# Patient Record
Sex: Male | Born: 1955 | Race: White | Hispanic: No | Marital: Married | Smoking: Never smoker
Health system: Southern US, Community
[De-identification: ages and names within clinical notes are randomized; demographics above are authoritative.]

## PROBLEM LIST (undated history)

## (undated) DIAGNOSIS — E785 Hyperlipidemia, unspecified: Secondary | ICD-10-CM

## (undated) DIAGNOSIS — I340 Nonrheumatic mitral (valve) insufficiency: Secondary | ICD-10-CM

## (undated) DIAGNOSIS — D369 Benign neoplasm, unspecified site: Secondary | ICD-10-CM

## (undated) DIAGNOSIS — I272 Pulmonary hypertension, unspecified: Secondary | ICD-10-CM

## (undated) DIAGNOSIS — T7840XA Allergy, unspecified, initial encounter: Secondary | ICD-10-CM

## (undated) DIAGNOSIS — I447 Left bundle-branch block, unspecified: Secondary | ICD-10-CM

## (undated) DIAGNOSIS — I5022 Chronic systolic (congestive) heart failure: Secondary | ICD-10-CM

## (undated) DIAGNOSIS — Z4502 Encounter for adjustment and management of automatic implantable cardiac defibrillator: Secondary | ICD-10-CM

## (undated) DIAGNOSIS — I519 Heart disease, unspecified: Secondary | ICD-10-CM

## (undated) DIAGNOSIS — I420 Dilated cardiomyopathy: Secondary | ICD-10-CM

## (undated) DIAGNOSIS — T82198A Other mechanical complication of other cardiac electronic device, initial encounter: Secondary | ICD-10-CM

## (undated) DIAGNOSIS — I10 Essential (primary) hypertension: Secondary | ICD-10-CM

## (undated) HISTORY — PX: CHOLECYSTECTOMY: SHX55

## (undated) HISTORY — DX: Allergy, unspecified, initial encounter: T78.40XA

## (undated) HISTORY — PX: COLONOSCOPY: SHX174

## (undated) HISTORY — DX: Essential (primary) hypertension: I10

## (undated) HISTORY — DX: Hyperlipidemia, unspecified: E78.5

## (undated) HISTORY — DX: Encounter for adjustment and management of automatic implantable cardiac defibrillator: Z45.02

## (undated) HISTORY — DX: Other mechanical complication of other cardiac electronic device, initial encounter: T82.198A

## (undated) HISTORY — PX: POLYPECTOMY: SHX149

## (undated) HISTORY — DX: Benign neoplasm, unspecified site: D36.9

## (undated) HISTORY — DX: Heart disease, unspecified: I51.9

## (undated) HISTORY — DX: Chronic systolic (congestive) heart failure: I50.22

## (undated) HISTORY — DX: Nonrheumatic mitral (valve) insufficiency: I34.0

## (undated) HISTORY — DX: Dilated cardiomyopathy: I42.0

## (undated) HISTORY — DX: Pulmonary hypertension, unspecified: I27.20

## (undated) HISTORY — PX: OTHER SURGICAL HISTORY: SHX169

## (undated) HISTORY — DX: Left bundle-branch block, unspecified: I44.7

---

## 2000-05-13 ENCOUNTER — Ambulatory Visit (HOSPITAL_COMMUNITY): Admission: RE | Admit: 2000-05-13 | Discharge: 2000-05-13 | Payer: Self-pay | Admitting: Gastroenterology

## 2001-10-25 ENCOUNTER — Ambulatory Visit (HOSPITAL_COMMUNITY): Admission: RE | Admit: 2001-10-25 | Discharge: 2001-10-25 | Payer: Self-pay | Admitting: *Deleted

## 2001-10-25 HISTORY — PX: CARDIAC CATHETERIZATION: SHX172

## 2002-04-27 ENCOUNTER — Encounter (INDEPENDENT_AMBULATORY_CARE_PROVIDER_SITE_OTHER): Payer: Self-pay | Admitting: Specialist

## 2002-04-27 ENCOUNTER — Observation Stay (HOSPITAL_COMMUNITY): Admission: RE | Admit: 2002-04-27 | Discharge: 2002-04-28 | Payer: Self-pay | Admitting: General Surgery

## 2002-04-27 ENCOUNTER — Encounter: Payer: Self-pay | Admitting: General Surgery

## 2002-10-28 ENCOUNTER — Encounter: Payer: Self-pay | Admitting: *Deleted

## 2002-10-28 ENCOUNTER — Observation Stay (HOSPITAL_COMMUNITY): Admission: EM | Admit: 2002-10-28 | Discharge: 2002-10-29 | Payer: Self-pay | Admitting: Emergency Medicine

## 2002-12-29 ENCOUNTER — Encounter: Payer: Self-pay | Admitting: Emergency Medicine

## 2002-12-29 ENCOUNTER — Emergency Department (HOSPITAL_COMMUNITY): Admission: EM | Admit: 2002-12-29 | Discharge: 2002-12-29 | Payer: Self-pay | Admitting: Emergency Medicine

## 2003-01-04 ENCOUNTER — Encounter: Payer: Self-pay | Admitting: Emergency Medicine

## 2003-01-04 ENCOUNTER — Emergency Department (HOSPITAL_COMMUNITY): Admission: EM | Admit: 2003-01-04 | Discharge: 2003-01-05 | Payer: Self-pay | Admitting: Emergency Medicine

## 2003-01-18 ENCOUNTER — Encounter: Payer: Self-pay | Admitting: Cardiology

## 2003-01-18 ENCOUNTER — Inpatient Hospital Stay (HOSPITAL_COMMUNITY): Admission: EM | Admit: 2003-01-18 | Discharge: 2003-01-24 | Payer: Self-pay | Admitting: Emergency Medicine

## 2003-01-20 ENCOUNTER — Encounter: Payer: Self-pay | Admitting: Cardiology

## 2003-09-28 ENCOUNTER — Ambulatory Visit (HOSPITAL_COMMUNITY): Admission: RE | Admit: 2003-09-28 | Discharge: 2003-09-29 | Payer: Self-pay | Admitting: Internal Medicine

## 2004-03-06 ENCOUNTER — Encounter: Admission: RE | Admit: 2004-03-06 | Discharge: 2004-05-23 | Payer: Self-pay | Admitting: Orthopedic Surgery

## 2004-09-16 ENCOUNTER — Ambulatory Visit: Payer: Self-pay

## 2004-12-17 ENCOUNTER — Ambulatory Visit: Payer: Self-pay | Admitting: Internal Medicine

## 2005-03-17 ENCOUNTER — Ambulatory Visit: Payer: Self-pay | Admitting: Internal Medicine

## 2005-06-24 ENCOUNTER — Ambulatory Visit: Payer: Self-pay | Admitting: Internal Medicine

## 2005-09-29 ENCOUNTER — Ambulatory Visit: Payer: Self-pay

## 2006-01-08 ENCOUNTER — Ambulatory Visit: Payer: Self-pay | Admitting: Internal Medicine

## 2006-03-17 ENCOUNTER — Ambulatory Visit: Payer: Self-pay | Admitting: Internal Medicine

## 2006-06-16 ENCOUNTER — Ambulatory Visit: Payer: Self-pay | Admitting: Internal Medicine

## 2006-07-10 ENCOUNTER — Ambulatory Visit: Payer: Self-pay

## 2006-10-21 ENCOUNTER — Ambulatory Visit: Payer: Self-pay | Admitting: Internal Medicine

## 2006-12-02 ENCOUNTER — Ambulatory Visit: Payer: Self-pay | Admitting: Internal Medicine

## 2006-12-17 ENCOUNTER — Ambulatory Visit: Payer: Self-pay | Admitting: Internal Medicine

## 2007-03-09 ENCOUNTER — Ambulatory Visit: Payer: Self-pay | Admitting: Internal Medicine

## 2007-06-17 ENCOUNTER — Ambulatory Visit: Payer: Self-pay | Admitting: Internal Medicine

## 2007-09-22 ENCOUNTER — Ambulatory Visit: Payer: Self-pay | Admitting: Internal Medicine

## 2007-12-15 ENCOUNTER — Ambulatory Visit: Payer: Self-pay | Admitting: Internal Medicine

## 2008-03-09 ENCOUNTER — Ambulatory Visit: Payer: Self-pay | Admitting: Internal Medicine

## 2008-06-13 ENCOUNTER — Ambulatory Visit: Payer: Self-pay | Admitting: Internal Medicine

## 2008-09-14 ENCOUNTER — Ambulatory Visit: Payer: Self-pay

## 2008-10-11 ENCOUNTER — Encounter: Payer: Self-pay | Admitting: Internal Medicine

## 2009-01-04 ENCOUNTER — Encounter (INDEPENDENT_AMBULATORY_CARE_PROVIDER_SITE_OTHER): Payer: Self-pay | Admitting: *Deleted

## 2009-02-15 ENCOUNTER — Encounter: Payer: Self-pay | Admitting: Internal Medicine

## 2009-02-19 ENCOUNTER — Encounter: Payer: Self-pay | Admitting: Internal Medicine

## 2009-02-19 HISTORY — PX: US ECHOCARDIOGRAPHY: HXRAD669

## 2009-02-21 DIAGNOSIS — I5022 Chronic systolic (congestive) heart failure: Secondary | ICD-10-CM

## 2009-02-21 DIAGNOSIS — R7309 Other abnormal glucose: Secondary | ICD-10-CM | POA: Insufficient documentation

## 2009-03-13 ENCOUNTER — Ambulatory Visit: Payer: Self-pay | Admitting: Internal Medicine

## 2009-03-13 LAB — CONVERTED CEMR LAB
CO2: 29 meq/L (ref 19–32)
Chloride: 103 meq/L (ref 96–112)
Potassium: 3.7 meq/L (ref 3.5–5.1)
Sodium: 139 meq/L (ref 135–145)

## 2009-03-22 ENCOUNTER — Ambulatory Visit: Payer: Self-pay | Admitting: Internal Medicine

## 2009-03-22 ENCOUNTER — Encounter: Payer: Self-pay | Admitting: Internal Medicine

## 2009-03-22 ENCOUNTER — Ambulatory Visit (HOSPITAL_COMMUNITY): Admission: RE | Admit: 2009-03-22 | Discharge: 2009-03-22 | Payer: Self-pay | Admitting: Internal Medicine

## 2009-03-23 ENCOUNTER — Telehealth: Payer: Self-pay | Admitting: Internal Medicine

## 2009-03-26 ENCOUNTER — Telehealth (INDEPENDENT_AMBULATORY_CARE_PROVIDER_SITE_OTHER): Payer: Self-pay | Admitting: *Deleted

## 2009-03-28 ENCOUNTER — Telehealth: Payer: Self-pay | Admitting: Internal Medicine

## 2009-04-16 ENCOUNTER — Telehealth: Payer: Self-pay | Admitting: Internal Medicine

## 2009-04-23 ENCOUNTER — Telehealth: Payer: Self-pay | Admitting: Internal Medicine

## 2009-04-26 ENCOUNTER — Ambulatory Visit: Payer: Self-pay | Admitting: Internal Medicine

## 2009-04-27 LAB — CONVERTED CEMR LAB
BUN: 12 mg/dL (ref 6–23)
Chloride: 102 meq/L (ref 96–112)
Eosinophils Relative: 3.2 % (ref 0.0–5.0)
Glucose, Bld: 115 mg/dL — ABNORMAL HIGH (ref 70–99)
HCT: 40.3 % (ref 39.0–52.0)
Hemoglobin: 13.6 g/dL (ref 13.0–17.0)
Lymphs Abs: 1.8 10*3/uL (ref 0.7–4.0)
MCV: 85.6 fL (ref 78.0–100.0)
Monocytes Absolute: 0.7 10*3/uL (ref 0.1–1.0)
Neutro Abs: 5.4 10*3/uL (ref 1.4–7.7)
Platelets: 189 10*3/uL (ref 150.0–400.0)
Potassium: 3.8 meq/L (ref 3.5–5.1)
RDW: 13.9 % (ref 11.5–14.6)
WBC: 8.2 10*3/uL (ref 4.5–10.5)
aPTT: 28.5 s (ref 21.7–28.8)

## 2009-05-03 ENCOUNTER — Inpatient Hospital Stay (HOSPITAL_COMMUNITY): Admission: RE | Admit: 2009-05-03 | Discharge: 2009-05-04 | Payer: Self-pay | Admitting: Internal Medicine

## 2009-05-03 ENCOUNTER — Ambulatory Visit: Payer: Self-pay | Admitting: Internal Medicine

## 2009-05-04 ENCOUNTER — Encounter: Payer: Self-pay | Admitting: Internal Medicine

## 2009-05-15 ENCOUNTER — Encounter: Payer: Self-pay | Admitting: Internal Medicine

## 2009-05-21 ENCOUNTER — Ambulatory Visit: Payer: Self-pay

## 2009-05-21 ENCOUNTER — Encounter: Payer: Self-pay | Admitting: Internal Medicine

## 2009-06-11 ENCOUNTER — Telehealth (INDEPENDENT_AMBULATORY_CARE_PROVIDER_SITE_OTHER): Payer: Self-pay | Admitting: *Deleted

## 2009-06-14 ENCOUNTER — Telehealth: Payer: Self-pay | Admitting: Internal Medicine

## 2009-07-23 ENCOUNTER — Telehealth: Payer: Self-pay | Admitting: Internal Medicine

## 2009-08-13 ENCOUNTER — Encounter: Payer: Self-pay | Admitting: Internal Medicine

## 2009-08-14 ENCOUNTER — Ambulatory Visit: Payer: Self-pay | Admitting: Internal Medicine

## 2009-09-11 ENCOUNTER — Telehealth: Payer: Self-pay | Admitting: Internal Medicine

## 2009-10-04 ENCOUNTER — Ambulatory Visit: Payer: Self-pay

## 2009-10-04 ENCOUNTER — Encounter: Payer: Self-pay | Admitting: Internal Medicine

## 2009-10-20 ENCOUNTER — Observation Stay (HOSPITAL_COMMUNITY): Admission: EM | Admit: 2009-10-20 | Discharge: 2009-10-21 | Payer: Self-pay | Admitting: Emergency Medicine

## 2009-12-24 ENCOUNTER — Encounter: Payer: Self-pay | Admitting: Internal Medicine

## 2009-12-25 ENCOUNTER — Ambulatory Visit: Payer: Self-pay | Admitting: Internal Medicine

## 2010-03-25 ENCOUNTER — Encounter: Payer: Self-pay | Admitting: Internal Medicine

## 2010-03-25 ENCOUNTER — Ambulatory Visit: Payer: Self-pay

## 2010-04-25 ENCOUNTER — Ambulatory Visit: Payer: Self-pay | Admitting: Cardiology

## 2010-07-03 ENCOUNTER — Ambulatory Visit: Payer: Self-pay

## 2010-07-03 ENCOUNTER — Encounter: Payer: Self-pay | Admitting: Internal Medicine

## 2010-09-30 ENCOUNTER — Encounter: Payer: Self-pay | Admitting: Internal Medicine

## 2010-09-30 ENCOUNTER — Ambulatory Visit: Admission: RE | Admit: 2010-09-30 | Discharge: 2010-09-30 | Payer: Self-pay | Source: Home / Self Care

## 2010-10-08 NOTE — Progress Notes (Signed)
Summary: CHEST PRESSURE  Phone Note Call from Patient Call back at Home Phone [phone]   Caller: Patient Summary of Call: EVERYNIGHT AT MID-NIGHT HE WILL FEEL CHEST PRESSURE, REQUEST CALL BACK Initial call taken by: Migdalia Dk,  September 11, 2009 12:24 PM  Follow-up for Phone Call        S/W Pt and he feels what he describes as the same feeling he gets when his device is interrogated. Wanted to know if his device transmits or does something at midnight every night. Advised pt that his device transmits around 4am. He is not having any SOB, radiation of pain, dyphoresis, etc and it only lasts like 15 secs. He is usually up watching t.v. Advised to continue to watch and if it continues to come in to get his device interrogated. He agrees with this plan. Follow-up by: Duncan Dull, RN, BSN,  September 11, 2009 5:00 PM

## 2010-10-08 NOTE — Cardiovascular Report (Signed)
Summary: Office Visit   Office Visit   Imported By: Roderic Ovens 03/27/2010 16:14:15  _____________________________________________________________________  External Attachment:    Type:   Image     Comment:   External Document

## 2010-10-08 NOTE — Procedures (Signed)
Summary: device check   Current Medications (verified): 1)  Lisinopril 10 Mg Tabs (Lisinopril) .... Once Daily 2)  Spironolactone 25 Mg Tabs (Spironolactone) .... Once Daily 3)  Ecotrin 325 Mg Tbec (Aspirin) .... Once Daily 4)  Carvedilol 25 Mg Tabs (Carvedilol) .... Two Times A Day 5)  Simvastatin 40 Mg Tabs (Simvastatin) .... Once Daily 6)  Vitamin D (Ergocalciferol) 50000 Unit Caps (Ergocalciferol) .[name].. 1 X Weekly As Needed 7)  Furosemide 40 Mg Tabs (Furosemide) .... Once Daily 8)  Vitamin D 1000 Unit Tabs (Cholecalciferol) .... Once Daily  Allergies (verified): 1)  ! * Seafood   ICD Specifications Following MD:  Sherryl Manges, MD     Referring MD:  [place] ICD Vendor:  Medtronic     ICD Model Number:  [phone]     ICD Serial Number:  [id number] ICD DOI:  05/03/2009     ICD Implanting MD:  Sherryl Manges, MD  Lead 1:    Location: RA     DOI: 09/28/2003     Model #: 2440     Serial #: [id number]     Status: active Lead 2:    Location: RV     DOI: 09/28/2003     Model #: 4403     Serial #: [id number]     Status: active Lead 3:    Location: LV     DOI: 09/28/2003     Model #: 4194     Serial #: [id number]     Status: active Lead 4:    Location: RV     DOI: 05/03/2009     Model #: 5188     Serial #: [id number]     Status: active  Indications::  ICM, CHF  Explantation Comments: 05/03/2009 Medtronic Sentry [id number] explanted  ICD Follow Up Remote Check?  No Battery Voltage:  3.17 V     Charge Time:  8.9 seconds     Underlying rhythm:  SR ICD Dependent:  No       ICD Device Measurements Atrium:  Amplitude: 5.3 mV, Impedance: 475 ohms, Threshold: 0.75 V at 0.4 msec Right Ventricle:  Amplitude: 13.3 mV, Impedance: 684 ohms, Threshold: 0.5 V at 0.4 msec Left Ventricle:  Impedance: 646 ohms, Threshold: 1.0 V at 0.4 msec Configuration: LV tip-RV coil Shock Impedance: 50/68 ohms   Episodes MS Episodes:  0     Percent Mode Switch:  0     Coumadin:  No Shock:  0     ATP:  0      Nonsustained:  0     Atrial Pacing:  31.9%     Ventricular Pacing:  100%  Brady Parameters Mode DDDR     Lower Rate Limit:  60     Upper Rate Limit 130 PAV 130     Sensed AV Delay:  120 Rate Response Parameters:  ADL response-3, Exertion response-3, Threshold-Med/Low, Acceleration-30 seconds, Deceleration-Exercise  Tachy Zones VF:  200     VT:  OFF     VT1:  171     Next Cardiology Appt Due:  06/08/2010 Tech Comments:  Rate response reprogrmmed as above.  Optivol and thoracic impedance abnormal 6/21-6/24.  ROV 3 months clinic. Altha Harm, LPN  March 25, 2010 11:30 AM

## 2010-10-08 NOTE — Procedures (Signed)
Summary: rov/ gd   Current Medications (verified): 1)  Lisinopril 10 Mg Tabs (Lisinopril) .... Once Daily 2)  Spironolactone 25 Mg Tabs (Spironolactone) .... Once Daily 3)  Ecotrin 325 Mg Tbec (Aspirin) .... Once Daily 4)  Carvedilol 25 Mg Tabs (Carvedilol) .... Two Times A Day 5)  Simvastatin 40 Mg Tabs (Simvastatin) .... Once Daily 6)  Vitamin D (Ergocalciferol) 50000 Unit Caps (Ergocalciferol) .[name].. 1 X Weekly As Needed 7)  Furosemide 40 Mg Tabs (Furosemide) .... Once Daily 8)  Vitamin D 1000 Unit Tabs (Cholecalciferol) .... Once Daily  Allergies (verified): 1)  ! * Seafood    ICD Specifications Following MD:  Sherryl Manges, MD     Referring MD:  [place] ICD Vendor:  Medtronic     ICD Model Number:  [phone]     ICD Serial Number:  [id number] ICD DOI:  05/03/2009     ICD Implanting MD:  Sherryl Manges, MD  Lead 1:    Location: RA     DOI: 09/28/2003     Model #: 1478     Serial #: [id number]     Status: active Lead 2:    Location: RV     DOI: 09/28/2003     Model #: 6578     Serial #: [id number]     Status: active Lead 3:    Location: LV     DOI: 09/28/2003     Model #: 4194     Serial #: [id number]     Status: active Lead 4:    Location: RV     DOI: 05/03/2009     Model #: 2725     Serial #: [id number]     Status: active  Indications::  ICM, CHF  Explantation Comments: 05/03/2009 Medtronic Sentry [id number] explanted  ICD Follow Up Remote Check?  No Battery Voltage:  3.19 V     Charge Time:  9.2 seconds     Underlying rhythm:  SR ICD Dependent:  No       ICD Device Measurements Atrium:  Amplitude: 4.4 mV, Impedance: 437 ohms, Threshold: 0.5 V at 0.4 msec Right Ventricle:  Amplitude: 14 mV, Impedance: 703 ohms, Threshold: 0.5 V at 0.4 msec Left Ventricle:  Impedance: 646 ohms, Threshold: 0.75 V at 0.4 msec Configuration: LV tip-RV coil Shock Impedance: 53/69 ohms   Episodes MS Episodes:  0     Coumadin:  No Shock:  0     ATP:  0     Nonsustained:  0     Atrial Pacing:   22.8%     Ventricular Pacing:  100%  Brady Parameters Mode DDDR     Lower Rate Limit:  60     Upper Rate Limit 130 PAV 130     Sensed AV Delay:  120 Rate Response Parameters:  ADL response-4, Exertion response-3, Threshold-Med/Low, Acceleration-30 seconds, Deceleration-Exercise  Tachy Zones VF:  200     VT:  OFF     VT1:  171     Next Cardiology Appt Due:  12/07/2009 Tech Comments:  Normal device function.  Patient reported feeling like he does during LV threshold testing at midnight every night.  LV autocapture turned off.  Output left fixed at 2V.  ROV 3 months SK. Gypsy Balsam RN BSN  October 04, 2009 12:02 PM

## 2010-10-08 NOTE — Cardiovascular Report (Signed)
Summary: Office Visit   Office Visit   Imported By: Roderic Ovens 10/10/2009 13:48:10  _____________________________________________________________________  External Attachment:    Type:   Image     Comment:   External Document

## 2010-10-08 NOTE — Assessment & Plan Note (Signed)
Summary: pc2   Referring Provider:  Peter [place] Primary Provider:  Deloria Lair  CC:  device check.  History of Present Illness: Mr. Docken is seen in followup for a CRT-D. implanted for congestive heart failure in the setting of nonischemic heart disease. He is doing pretty well. He remains class II. He has modest shortness of breath. He recently underwent echo by Dr. [place] demonstrating interval improvement in ejection fraction to about 45%.  He underwent device generator replacement about4  month ago with a new rate sense lead placed because of the presence of his 6949 lead.  when he was seen a month later heart rate excursion was relatively flat and the rate response out of rhythm was made more aggressive. He has felt better since this was done.The patient denies SOB, chest pain, edema or palpitations      Current Medications (verified): 1)  Lisinopril 10 Mg Tabs (Lisinopril) .... Once Daily 2)  Spironolactone 25 Mg Tabs (Spironolactone) .... Once Daily 3)  Ecotrin 325 Mg Tbec (Aspirin) .... Once Daily 4)  Carvedilol 25 Mg Tabs (Carvedilol) .... Two Times A Day 5)  Simvastatin 40 Mg Tabs (Simvastatin) .... Once Daily 6)  Vitamin D (Ergocalciferol) 50000 Unit Caps (Ergocalciferol) .[name].. 1 X Weekly As Needed 7)  Furosemide 40 Mg Tabs (Furosemide) .... Once Daily 8)  Vitamin D 1000 Unit Tabs (Cholecalciferol) .... Once Daily 9)  Fish Oil 1000 Mg Caps (Omega-3 Fatty Acids) .... Once Daily  Allergies (verified): 1)  ! 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  Alcohol Use - no Drug Use - no  Vital Signs:  Patient profile:   54 year old male Height:      67 inches Weight:      251 pounds BMI:     39.45 Pulse rate:   79 / minute Pulse rhythm:   regular BP sitting:   104 / 68  (left arm) Cuff size:   large  Vitals Entered By: Judithe Modest CMA (December 25, 2009 11:16 AM)    ICD Specifications Following MD:  Sherryl Manges, MD     Referring MD:  [place] ICD Vendor:  Medtronic     ICD Model Number:  [phone]     ICD Serial Number:  [id number] ICD DOI:  05/03/2009     ICD Implanting MD:  Sherryl Manges, MD  Lead 1:    Location: RA     DOI: 09/28/2003     Model #: 1478     Serial #: [id number]     Status: active Lead 2:    Location: RV     DOI: 09/28/2003     Model #: 6578     Serial #: [id number]     Status: active Lead 3:    Location: LV     DOI: 09/28/2003     Model #: 4132     Serial #: [id number]     Status: active Lead 4:    Location: RV  DOI: 05/03/2009     Model #: 6295     Serial #: [id number]     Status: active  Indications::  ICM, CHF  Explantation Comments: 05/03/2009 Medtronic Sentry [phone] explanted  ICD Follow Up Remote Check?  No Battery Voltage:  3.18 V     Charge Time:  8.9 seconds     Underlying rhythm:  SR ICD Dependent:  No       ICD Device Measurements Atrium:  Amplitude: 4.5 mV, Impedance: 437 ohms, Threshold: 0.5 V at 0.4 msec Right Ventricle:  Amplitude: 12.9 mV, Impedance: 646 ohms, Threshold: 0.5 V at 0.4 msec Left Ventricle:  Impedance: 627 ohms, Threshold: 1.0 V at 0.4 msec Configuration: LV tip-RV coil Shock Impedance: 50/6 ohms   Episodes MS Episodes:  0     Percent Mode Switch:  0     Coumadin:  No Shock:  0     ATP:  0     Nonsustained:  0     Atrial Pacing:  32.3%     Ventricular  Pacing:  100%  Brady Parameters Mode DDDR     Lower Rate Limit:  60     Upper Rate Limit 130 PAV 130     Sensed AV Delay:  120 Rate Response Parameters:  ADL response-4, Exertion response-3, Threshold-Med/Low, Acceleration-30 seconds, Deceleration-Exercise  Tachy Zones VF:  200     VT:  OFF     VT1:  171     Next Cardiology Appt Due:  03/08/2010 Tech Comments:  No parameter changes.  [phone] lead stable, with 5076 r/s lead.  No Carlink @ this time. ROV 3 months clinic.  Checked by [employer].  Optivol and thoracic impedance normal. Altha Harm, LPN  December 25, 2009 11:32 AM   Impression & Recommendations:  Problem # 1:  CHRONIC SYSTOLIC HEART FAILURE (ICD-428.22) stable on current meds His updated medication list for this problem includes:    Lisinopril 10 Mg Tabs (Lisinopril) ..... Once daily    Spironolactone 25 Mg Tabs (Spironolactone) ..... Once daily    Ecotrin 325 Mg Tbec (Aspirin) ..... Once daily    Carvedilol 25 Mg Tabs (Carvedilol) .[name]..[name] Two times a day    Furosemide 40 Mg Tabs (Furosemide) ..... Once daily  Problem # 2:  IMPLANTATION  DEFIBRILLATOR, MEDTRONIC CRT (ICD-V45.02) Device parameters and data were reviewed and no changes were made  Problem # 3:  R/O CARDIOMYOPATHY, NON ISCHEMIC NOW LARGELY  RESOLVED (ICD-425.9) stable His updated medication list for this problem includes:    Lisinopril 10 Mg Tabs (Lisinopril) ..... Once daily    Spironolactone 25 Mg Tabs (Spironolactone) ..... Once daily    Ecotrin 325 Mg Tbec (Aspirin) ..... Once daily    Carvedilol 25 Mg Tabs (Carvedilol) .[name]..[name] Two times a day    Furosemide 40 Mg Tabs (Furosemide) ..... Once daily  Patient Instructions: 1)  Your physician recommends that you schedule a follow-up appointment in: 3 months with device clinic.

## 2010-10-08 NOTE — Cardiovascular Report (Signed)
Summary: Office Visit   Office Visit   Imported By: Roderic Ovens 12/27/2009 13:51:21  _____________________________________________________________________  External Attachment:    Type:   Image     Comment:   External Document

## 2010-10-08 NOTE — Procedures (Signed)
Summary: Cardiology Device Clinic   Current Medications (verified): 1)  Lisinopril 10 Mg Tabs (Lisinopril) .... Once Daily 2)  Spironolactone 25 Mg Tabs (Spironolactone) .... Once Daily 3)  Ecotrin 325 Mg Tbec (Aspirin) .... Once Daily 4)  Carvedilol 25 Mg Tabs (Carvedilol) .... Two Times A Day 5)  Simvastatin 40 Mg Tabs (Simvastatin) .... Once Daily 6)  Vitamin D (Ergocalciferol) 50000 Unit Caps (Ergocalciferol) .[name].. 1 X Weekly As Needed 7)  Furosemide 40 Mg Tabs (Furosemide) .... Once Daily 8)  Vitamin D 1000 Unit Tabs (Cholecalciferol) .... Once Daily  Allergies (verified): 1)  ! * Seafood   ICD Specifications Following MD:  Sherryl Manges, MD     Referring MD:  [place] ICD Vendor:  Medtronic     ICD Model Number:  [id number]     ICD Serial Number:  [id number] H ICD DOI:  05/03/2009     ICD Implanting MD:  Sherryl Manges, MD  Lead 1:    Location: RA     DOI: 09/28/2003     Model #: 0102     Serial #: [id number]     Status: active Lead 2:    Location: RV     DOI: 09/28/2003     Model #: 3474     Serial #: [id number]     Status: active Lead 3:    Location: LV     DOI: 09/28/2003     Model #: 4194     Serial #: [id number]     Status: active Lead 4:    Location: RV     DOI: 05/03/2009     Model #: 8416     Serial #: [id number]     Status: active  Indications::  ICM, CHF  Explantation Comments: 05/03/2009 Medtronic Sentry [id number] explanted  ICD Follow Up Remote Check?  No Battery Voltage:  3.15 V     Charge Time:  9.1 seconds     Underlying rhythm:  sr ICD Dependent:  No       ICD Device Measurements Atrium:  Amplitude: 5.3 mV, Impedance: 475 ohms, Threshold: 0.5 V at 0.4 msec Right Ventricle:  Amplitude: 13.3 mV, Impedance: 703 ohms, Threshold: 0.5 V at 0.4 msec Left Ventricle:  Impedance: 646 ohms, Threshold: 1.0 V at 0.4 msec Configuration: LV tip-RV coil Shock Impedance: 52/73 ohms   Episodes MS Episodes:  0     Percent Mode Switch:  0     Coumadin:  No Shock:  0     ATP:   0     Nonsustained:  0     Atrial Pacing:  28.7%     Ventricular Pacing:  100%  Brady Parameters Mode DDDR     Lower Rate Limit:  60     Upper Rate Limit 130 PAV 130     Sensed AV Delay:  120 Rate Response Parameters:  ADL response-3, Exertion response-3, Threshold-Med/Low, Acceleration-30 seconds, Deceleration-Exercise  Tachy Zones VF:  200     VT:  OFF     VT1:  171     Next Cardiology Appt Due:  09/08/2010 Tech Comments:  No parameter changes.  Device function normal. Opitvol and thoracic impedance normal.  6949 lead stable, SIC 0.  ROV 3months clinic. Altha Harm, LPN  July 03, 2010 4:14 PM

## 2010-10-08 NOTE — Miscellaneous (Signed)
Summary:  ABDOMEN - 2 VIEW  Clinical Lists Changes  Observations: Added new observation of RESULTS MISC:    ABDOMEN - 2 VIEW   Comparison: None    Findings: Prior cholecystectomy. There is normal bowel gas pattern.   No free air.  No organomegaly or suspicious calcification.  No   acute bony abnormality. Defibrillator wire is seen within the   heart.  Heart appears mildly enlarged.    IMPRESSION:   No acute findings.    Read By:  Charlett Nose,  M.D.   Released By:  Charlett Nose,  M.D. (10/21/2009 10:25)      MISC. Report  Procedure date:  10/21/2009  Findings:         ABDOMEN - 2 VIEW   Comparison: None    Findings: Prior cholecystectomy. There is normal bowel gas pattern.   No free air.  No organomegaly or suspicious calcification.  No   acute bony abnormality. Defibrillator wire is seen within the   heart.  Heart appears mildly enlarged.    IMPRESSION:   No acute findings.    Read By:  Charlett Nose,  M.D.   Released By:  Charlett Nose,  M.D.

## 2010-10-08 NOTE — Procedures (Signed)
Summary: device/saf   Allergies: 1)  ! * Seafood   ICD Specifications Following MD:  Sherryl Manges, MD     Referring MD:  [place] ICD Vendor:  Medtronic     ICD Model Number:  [phone]     ICD Serial Number:  [id number] ICD DOI:  05/03/2009     ICD Implanting MD:  Sherryl Manges, MD  Lead 1:    Location: RA     DOI: 09/28/2003     Model #: 5076     Serial #: [id number]     Status: active Lead 2:    Location: RV     DOI: 09/28/2003     Model #: 1308     Serial #: [id number]     Status: active Lead 3:    Location: LV     DOI: 09/28/2003     Model #: 4194     Serial #: [id number]     Status: active Lead 4:    Location: RV     DOI: 05/03/2009     Model #: 4010     Serial #: [id number]     Status: active  Indications::  ICM, CHF  Explantation Comments: 05/03/2009 Medtronic Sentry [phone] explanted  ICD Follow Up ICD Dependent:  No       ICD Device Measurements Configuration: LV tip-RV coil  Episodes Coumadin:  No  Brady Parameters Mode DDDR     Lower Rate Limit:  60     Upper Rate Limit 130 PAV 130     Sensed AV Delay:  120 Rate Response Parameters:  ADL response-3, Exertion response-3, Threshold-Med/Low, Acceleration-30 seconds, Deceleration-Exercise  Tachy Zones VF:  200     VT:  OFF     VT1:  171

## 2010-10-08 NOTE — Cardiovascular Report (Signed)
Summary: Office Visit   Office Visit   Imported By: Roderic Ovens 07/18/2010 14:30:37  _____________________________________________________________________  External Attachment:    Type:   Image     Comment:   External Document

## 2010-10-10 NOTE — Procedures (Signed)
Summary: device check   Current Medications (verified): 1)  Lisinopril 10 Mg Tabs (Lisinopril) .... Once Daily 2)  Spironolactone 25 Mg Tabs (Spironolactone) .... Once Daily 3)  Ecotrin 325 Mg Tbec (Aspirin) .... Once Daily 4)  Carvedilol 25 Mg Tabs (Carvedilol) .... Two Times A Day 5)  Simvastatin 40 Mg Tabs (Simvastatin) .... Once Daily 6)  Furosemide 40 Mg Tabs (Furosemide) .... Once Daily 7)  Vitamin D 1000 Unit Tabs (Cholecalciferol) .... One By Mouth Two Times A Day  Allergies (verified): 1)  ! * Seafood   ICD Specifications Following MD:  Sherryl Manges, MD     Referring MD:  [place] ICD Vendor:  Medtronic     ICD Model Number:  [phone]     ICD Serial Number:  [id number] H ICD DOI:  05/03/2009     ICD Implanting MD:  Sherryl Manges, MD  Lead 1:    Location: RA     DOI: 09/28/2003     Model #: 5638     Serial #: [id number]     Status: active Lead 2:    Location: RV     DOI: 09/28/2003     Model #: 1884     Serial #: [id number]     Status: active Lead 3:    Location: LV     DOI: 09/28/2003     Model #: 4194     Serial #: [id number]     Status: active Lead 4:    Location: RV     DOI: 05/03/2009     Model #: 7322     Serial #: [id number]     Status: active  Indications::  ICM, CHF  Explantation Comments: 05/03/2009 Medtronic Sentry [id number]/[id number] explanted  ICD Follow Up Remote Check?  No Battery Voltage:  3.14 V     Charge Time:  9.1 seconds     Underlying rhythm:  SR ICD Dependent:  No       ICD Device Measurements Atrium:  Amplitude: 4.8 mV, Impedance: 418 ohms, Threshold: 0.5 V at 0.4 msec Right Ventricle:  Amplitude: 11.0 mV, Impedance: 684 ohms, Threshold: 0.5 V at 0.4 msec Left Ventricle:  Impedance: 646 ohms, Threshold: 0.75 V at 0.4 msec Configuration: LV tip-RV coil Shock Impedance: 51/72 ohms   Episodes MS Episodes:  0     Percent Mode Switch:  0     Coumadin:  No Shock:  0     ATP:  0     Nonsustained:  0     Atrial Pacing:  19.8%     Ventricular Pacing:   100%  Brady Parameters Mode DDDR     Lower Rate Limit:  60     Upper Rate Limit 130 PAV 130     Sensed AV Delay:  120 Rate Response Parameters:  ADL response-3, Exertion response-3, Threshold-Med/Low, Acceleration-30 seconds, Deceleration-Exercise  Tachy Zones VF:  200     VT:  OFF     VT1:  171     Next Cardiology Appt Due:  12/08/2010 Tech Comments:  No parameter changes. Device function normal.  Optivol normal, thoracic impedance below threshold, the patient denies any SOB, and no ankle edema noted.  He is however scheduled for a sleep study this evening.  No Carelink @ this time. ROV 3 months with Dr. Graciela Husbands.

## 2010-10-16 NOTE — Cardiovascular Report (Signed)
Summary: Office Visit   Office Visit   Imported By: Roderic Ovens 10/10/2010 10:31:21  _____________________________________________________________________  External Attachment:    Type:   Image     Comment:   External Document

## 2010-11-01 ENCOUNTER — Ambulatory Visit (INDEPENDENT_AMBULATORY_CARE_PROVIDER_SITE_OTHER): Payer: 59 | Admitting: Cardiology

## 2010-11-01 DIAGNOSIS — I509 Heart failure, unspecified: Secondary | ICD-10-CM

## 2010-11-01 DIAGNOSIS — I428 Other cardiomyopathies: Secondary | ICD-10-CM

## 2010-11-01 DIAGNOSIS — I447 Left bundle-branch block, unspecified: Secondary | ICD-10-CM

## 2010-11-01 DIAGNOSIS — E7800 Pure hypercholesterolemia, unspecified: Secondary | ICD-10-CM

## 2010-11-27 LAB — POCT CARDIAC MARKERS
CKMB, poc: <1 ng/mL — ABNORMAL LOW (ref 1.0–8.0)
Myoglobin, poc: 41.5 ng/mL (ref 12–200)
Troponin i, poc: <0.05 ng/mL (ref 0.00–0.09)

## 2010-11-27 LAB — CBC
HCT: 37.8 % — ABNORMAL LOW (ref 39.0–52.0)
Hemoglobin: 13 g/dL (ref 13.0–17.0)
MCHC: 34.4 g/dL (ref 30.0–36.0)
MCV: 86.2 fL (ref 78.0–100.0)
Platelets: 165 10*3/uL (ref 150–400)
RBC: 4.39 MIL/uL (ref 4.22–5.81)
RDW: 14.5 % (ref 11.5–15.5)
RDW: 14.9 % (ref 11.5–15.5)
WBC: 10.3 10*3/uL (ref 4.0–10.5)

## 2010-11-27 LAB — POCT I-STAT, CHEM 8
BUN: 13 mg/dL (ref 6–23)
Calcium, Ion: 1.1 mmol/L — ABNORMAL LOW (ref 1.12–1.32)
Chloride: 103 meq/L (ref 96–112)
Creatinine, Ser: 0.7 mg/dL (ref 0.4–1.5)
Glucose, Bld: 117 mg/dL — ABNORMAL HIGH (ref 70–99)
HCT: 38 % — ABNORMAL LOW (ref 39.0–52.0)
Hemoglobin: 12.9 g/dL — ABNORMAL LOW (ref 13.0–17.0)
Potassium: 4 mEq/L (ref 3.5–5.1)
Sodium: 136 meq/L (ref 135–145)
TCO2: 28 mmol/L (ref 0–100)

## 2010-11-27 LAB — COMPREHENSIVE METABOLIC PANEL
AST: 17 U/L (ref 0–37)
Albumin: 3.6 g/dL (ref 3.5–5.2)
BUN: 9 mg/dL (ref 6–23)
Calcium: 9 mg/dL (ref 8.4–10.5)
Chloride: 99 mEq/L (ref 96–112)
Creatinine, Ser: 0.83 mg/dL (ref 0.4–1.5)
GFR calc Af Amer: >60 mL/min (ref >60)
Total Bilirubin: 0.7 mg/dL (ref 0.3–1.2)

## 2010-11-27 LAB — CK TOTAL AND CKMB (NOT AT ARMC)
CK, MB: 0.9 ng/mL (ref 0.3–4.0)
CK, MB: 0.9 ng/mL (ref 0.3–4.0)
CK, MB: 1.2 ng/mL (ref 0.3–4.0)
Relative Index: 0.8 (ref 0.0–2.5)
Relative Index: 1 (ref 0.0–2.5)
Total CK: 112 U/L (ref 7–232)
Total CK: 115 U/L (ref 7–232)
Total CK: 122 U/L (ref 7–232)

## 2010-11-27 LAB — LIPID PANEL
Cholesterol: 112 mg/dL (ref 0–200)
LDL Cholesterol: 52 mg/dL (ref 0–99)
Total CHOL/HDL Ratio: 3.3 RATIO
Triglycerides: 129 mg/dL (ref <150)
VLDL: 26 mg/dL (ref 0–40)

## 2010-11-27 LAB — TROPONIN I
Troponin I: <0.01 ng/mL (ref 0.00–0.06)
Troponin I: <0.01 ng/mL (ref 0.00–0.06)

## 2010-11-27 LAB — AMYLASE: Amylase: 41 U/L (ref 0–105)

## 2011-01-19 ENCOUNTER — Other Ambulatory Visit: Payer: Self-pay | Admitting: Cardiology

## 2011-01-20 NOTE — Telephone Encounter (Signed)
escribe medication per fax request  

## 2011-01-21 DIAGNOSIS — R7301 Impaired fasting glucose: Secondary | ICD-10-CM | POA: Insufficient documentation

## 2011-01-21 NOTE — Discharge Summary (Signed)
NAME:  Jonathon Avila, [place] NO.:  [id number]   MEDICAL RECORD NO.:  [id number]          PATIENT TYPE:  INP   LOCATION:  2007                         FACILITY:  [facility]   PHYSICIAN:  Duke Salvia, MD, FACCDATE OF BIRTH:  Oct 11, 1955   DATE OF ADMISSION:  05/03/2009  DATE OF DISCHARGE:  05/04/2009                               DISCHARGE SUMMARY   CARDIOLOGIST:  [name], MD   No known drug allergies.   TIME FOR THIS DICTATION:  Greater than 35 minutes.   FINAL DIAGNOSES:  1. Discharging day 1, status post explant of a Medtronic InSync Sentry      cardioverter-defibrillator with implant of a Medtronic CONCERTO      biventricular implantable cardioverter defibrillator generator with      placement of new right ventricular lead (this is replacing his 6949      lead).  2. Defibrillator threshold study less than or equal to 20 joules.   SECONDARY DIAGNOSES:  1. 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  2. Echocardiogram, February 19, 2009, ejection fraction 45%.  3. Nonischemic cardiomyopathy.  4. New York Heart Association class II chronic systolic congestive      heart failure.  5. Dyslipidemia.  6. Obesity.   PROCEDURE:  May 03, 2009:  Generator change with replacement of a  Medtronic InSync Sentry with a Medtronic CONCERTO CRT-D  resynchronization ICD system, Dr. Sherryl Manges.  Defibrillator threshold  study less than or equal to 20 joules.   BRIEF HISTORY:  Jonathon Avila is a 55 year old male.  He has a history  of congestive heart failure.  It is at class II currently.  It is a  nonischemic cardiomyopathy.  He has in place a BiV ICD.  It is now at  elective replacement indicator.  His most recent echocardiogram shows  ejection fraction of 45%.  The risks and benefits of replacement of the  generator explained to the patient, and he wishes to proceed.  Of note,  he has a 6949 right ventricular lead in place, and this  will also be  replaced at the time of the generator change.   HOSPITAL COURSE:  The patient presents electively on May 03, 2009.  He underwent change out of the generator without complication.  A new  right ventricular lead was placed.  Mobility of the left arm has been  discussed with the patient as has incision care.  Chest x-ray shows that  the leads are in the appropriate position.  There is no pneumothorax.  Device interrogation shows that all values are within normal limits.  The patient has no hematoma and pain is controlled well with oral  analgesics.  He is discharged in postprocedure day #1 on the following  meds:  1. Enteric-coated aspirin 325 mg daily.  2. Coreg 25 mg twice daily.  3. Furosemide 40 mg daily as needed.  4. Lisinopril 10 mg daily.  5. Simvastatin 40 mg daily at bedtime.  6. Spironolactone 25 mg daily.  7. Vitamin D2 of 50,000 units on Saturdays.  8. Vitamin D3  of 1000 units daily.   LABORATORY STUDIES:  Pertinent to this admission were drawn on April 26, 2009.  Sodium 136, potassium 3.8, chloride 102, carbonate 28,  glucose 115, BUN is 12, creatinine 0.7.  White cells 8.2, hemoglobin  13.6, hematocrit 40.3, and platelets of 189.  Protime 11.6, INR 1.1.      Jonathon Avila, [place]      Duke Salvia, MD, [facility]  Electronically Signed    GM/MEDQ  D:  05/04/2009  T:  05/04/2009  Job:  [id number]   cc:   [name], M.D.  Ursula Beath, MD

## 2011-01-21 NOTE — Op Note (Signed)
NAME:  Jonathon Avila, Jonathon Avila           ACCOUNT NO.:  [id number]   MEDICAL RECORD NO.:  [id number]          PATIENT TYPE:  AMB   LOCATION:  SDS                          FACILITY:  [facility]   PHYSICIAN:  Doylene Canning. Ladona Ridgel, MD    DATE OF BIRTH:  [birth year]   DATE OF PROCEDURE:  DATE OF DISCHARGE:  03/22/2009                               OPERATIVE REPORT   SURGEON:  Doylene Canning. Ladona Ridgel, MD   PROCEDURE PERFORMED:  Contrast venography of the left upper extremity.   INTRODUCTION:  The patient is a 55 year old male with a history of BiV  ICD insertion who has a 6949 lead.  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  He is referred now for left upper extremity venography.   PROCEDURE:  After informed consent was obtained, the patient was taken  to the Diagnostic EP Lab in a fasting state.  After usual preparation,  20 mL of contrast was injected into the left upper extremity venous  system, which demonstrated a patent left subclavian vein.  The patient  was observed for 30 minutes and had no complications from his procedure,  and he was returned to his room in satisfactory condition.   COMPLICATIONS:  There were no immediate procedure complications.   RESULTS:  This demonstrates a patent left subclavian vein in a patient  with prior BiV ICD insertion with pending 6949 lead revision.      Doylene Canning. Ladona Ridgel, MD  Electronically Signed     Doylene Canning. Ladona Ridgel, MD  Electronically Signed    GWT/MEDQ  D:  03/22/2009  T:  03/23/2009  Job:  [id number]

## 2011-01-21 NOTE — Assessment & Plan Note (Signed)
[facility]                         ELECTROPHYSIOLOGY OFFICE NOTE   NAME:Jonathon Avila, Jonathon Avila                    MRN:          [id number]  DATE:03/09/2008                            DOB:          Feb 05, 1956    Jonathon Avila is seen in followup for congestive heart failure in the  setting of nonischemic heart disease.  He is status post CRT  implantation and continues to do really pretty well.  He has been  struggling with elevated blood sugars.  He has been able to lose 10  pounds in the last year and is working on that slowly.   His medications are reviewed and include,  1. Coreg 25 b.i.d.  2. Spironolactone 25.  3. Lisinopril 10.  4. Aspirin.  5. Simvastatin 40.   On examination today, his blood pressure is 118/60 with a pulse of 70.  His weight was 246 as noted.  His lungs were clear.  His neck veins were  flat.  His heart sounds were regular.  His extremities were without  edema.   Interrogation of his Medtronic ICD demonstrates P-wave of 6.3 with  impedance of 448 and threshold of 1 V at 0.2 and RA and RV amplitude was  20.4 and impedance of 614, the LV impedance was 736 with threshold of 1  V at 0.3.  There were no intercurrent episodes.  He is 100%  ventricularly paced.   He has a 6949 lead in.   IMPRESSION:  1. Nonischemic cardiomyopathy.  2. Congestive heart failure - chronic - systolic.  3. Status post cardiac resynchronization therapy defibrillator for the      above for 6949 lead - stable.  4. Obesity.  5. Elevated blood sugars.   Jonathon Avila is doing quite well from an arrhythmia point of view.  We  had a long discussion regarding the importance of exercise and weight  loss in terms of reducing the potential for developing diabetes.   He understands and he will be working on this hopefully.   We will see him again in 1 year's time and he will follow up with Dr.  Swaziland and Mazzocchi.     Duke Salvia, MD, [facility]  Electronically Signed    SCK/MedQ  DD: 03/09/2008  DT: 03/10/2008  Job #: [id number]   cc:   [name], M.D.  Talmadge Coventry, M.D.

## 2011-01-21 NOTE — Op Note (Signed)
NAME:  TORREZ, RENFROE NO.:  [id number]   MEDICAL RECORD NO.:  [id number]          PATIENT TYPE:  INP   LOCATION:  2007                         FACILITY:  [facility]   PHYSICIAN:  Duke Salvia, MD, FACCDATE OF BIRTH:  [birth year]   DATE OF PROCEDURE:  05/03/2009  DATE OF DISCHARGE:                               OPERATIVE REPORT   PREOPERATIVE DIAGNOSIS:  Previously implanted CRT-D with a 6949 lead,  now at end-of-life.   POSTOPERATIVE DIAGNOSIS:  Previously implanted CRT-D with a 6949 lead,  now at end-of-life.   PROCEDURE:  Explantation of a previously implanted device implantation,  with a new device insertion of a new RV sense lead and intraoperative  defibrillation threshold testing.   Following obtaining informed consent, the patient was brought to the  Electrophysiology Laboratory and placed on the fluoroscopic table in the  supine position.  After routine prep and drape and with a prior venogram  having demonstrated patency extrathoracic left subclavian vein, an  incision was made and carried down to layer of device pocket using sharp  dissection and electrocautery.  I could not get cephalad enough with a  location of the incision in the pocket so I had to explant the device  from the offset.   We then gained access to the extrathoracic left subclavian vein which  was done without difficulty.  However, deployment of a sheath and/or  leads was quite difficult.  We ended up having to upsize using 8-French  and 9-French sheaths and then using an Amplatz superstiff wire to allow  for tracking of the 7-French sheath through which was then passed, a  Medtronic 5076, 58-cm active fixation ventricular lead, serial number  [id number].  Under fluoroscopic guidance, it was manipulated to just  above the RV defibrillator lead where the bipolar R-wave was 10.6 with a  pace impedance of 1026 and threshold of 0.2 volts at 0.5 msec.  Current  threshold 1.3 mA.  There was  no diaphragmatic pacing at 10 volts and the  current of injury was brisk.  This lead was secured to the prepectoral  fascia and marked with a tie.   The previously implanted leads were then attached to a Medtronic  Concerto II CRT-D device, model D274TRK, serial number [id number].  Through the device, bipolar P-wave was 4.6 with pace impedance of 437,  threshold 0.5 and 0.4.  The R-wave was 9.2 with a pace impedance of 836,  threshold 0.75 at 0.4, and the LV impedance of 703 with threshold of  0.4.  Proximal coil impedance was 69 ohms.  Distal coil impedance was 56  ohms.   At this point, the defibrillation threshold testing was undertaken.  Ventricular fibrillation was induced via the T-wave shock after a total  duration of 7 seconds.  A 20-joule shock was delivered through measured  resistance of 55 ohms, terminating ventricular fibrillation and  restoring sinus rhythm.  The device was implanted.  Prior to this,  however, the pocket had to be expanded cephalad to allow for easy  coverage of the device and the pocket was copiously irrigated with  antibiotic-containing saline solution.  I should note also that the  paced sense portion of the previously implanted 6949 lead was capped.   The wound was then closed in 3 layers in a normal fashion.  Wound was  washed, dried and a benzoin and Steri-Strip dressing was applied.  Needle counts, sponge counts, and instrument counts were correct at the  end of procedure according to the staff.  The patient tolerated the  procedure without apparent complication.      Duke Salvia, MD, [facility]  Electronically Signed     SCK/MEDQ  D:  05/03/2009  T:  05/04/2009  Job:  [id number]   cc:   [name], M.D.

## 2011-01-21 NOTE — Assessment & Plan Note (Signed)
[facility]                         ELECTROPHYSIOLOGY OFFICE NOTE   NAME:Jonathon Avila, Jonathon Avila                    MRN:          [id number]  DATE:03/09/2007                            DOB:          Nov 16, 1955    Mr. Dymek comes in.  He has congestive heart failure, now class II  in the setting of nonischemic heart disease.   His big issue has been his weight is continuing to increase.  He has not  been exercising.   MEDICATIONS:  Coreg, Altace, aspirin, and Lovastatin.   PHYSICAL EXAMINATION:  VITAL SIGNS:  His blood pressure is 131/79.  His  pulse is 89, and his weight is up to 258 pounds, only 3 pounds in the  last year, but is 20 pounds in the last 3 years.  LUNGS:  Clear.  HEART SOUNDS:  Regular.  EXTREMITIES:  Without edema.   Interrogation of his Medtronic Sentry [phone] ICD demonstrates a P wave of  6.2 with impedance of 416, a threshold of 0.5 and 0.5.  R wave was 20.4  with an impedance of 600, threshold of 0.5 and 0.3.  The LV impedance  was 696 with a threshold of 1 volt at 0.3.  Battery voltage is 3.02.  There are no intercurrent therapies.   IMPRESSION:  1. Nonischemic cardiomyopathy.  2. Congestive heart failure, previously class III now class II.  3. Status post CRT for the above.  4. Obesity.   Mr. Kerstein is stable from an arrhythmia point of view.  I have  encouraged him about working on his weight, dieting, small portions, and  his obesity is going to be a huge problem for him if he does not get it  under control.   He has pledged to try and lose 10 pounds before he sees Dr. Swaziland in  November.     Duke Salvia, MD, [facility]  Electronically Signed    SCK/MedQ  DD: 03/09/2007  DT: 03/09/2007  Job #: [id number]   cc:   [name], M.D.  Talmadge Coventry, M.D.

## 2011-01-24 NOTE — Discharge Summary (Signed)
NAME:  Jonathon Avila, SWIFT NO.:  [id number]   MEDICAL RECORD NO.:  [id number]                   PATIENT TYPE:  OIB   LOCATION:  3715                                 FACILITY:  [facility]   PHYSICIAN:  Duke Salvia, M.D.               DATE OF BIRTH:  [birth year]   DATE OF ADMISSION:  09/28/2003  DATE OF DISCHARGE:  09/29/2003                                 DISCHARGE SUMMARY   PRIMARY DIAGNOSES:  1. Congestive heart failure.  2. Ischemic cardiomyopathy.   SECONDARY DIAGNOSES:  1. Nonischemic cardiomyopathy with left ventricular dysfunction.  2. Left bundle-branch block.  3. Persistent congestive heart failure.   HISTORY OF PRESENT ILLNESS:  This is a 55 year old gentleman with  unexplained but presumably viral nonischemic cardiomyopathy for which he  presented in severe CHF early spring despite significant improvements on  complete regimen of standard medical therapy, remains function quite limited  with shortness of breath at less than 10 steps or 100 yards.  He estimated 6  minute walk would be about 325 meters based on his other walking time.  He  has 2 pillow orthopnea, but he does not have nocturnal dyspnea, peripheral  edema, no syncope or palpitations.  The patient was admitted for placement  of a BiV ICD.  He underwent successful implant of his BiV ICD with EFPs of  less than 25 joules with reverse polarity.  CRT was placed.  Patient had no  immediate postop complications.  He was discharged the following day in  stable condition.  Chest x-ray showed no pneumothorax.  His BiV ICD  parameters were within normal limits, and he was discharged to home in  stable condition.  He was discharged on all of his previous medications.   DISCHARGE MEDICATIONS:  1. Coreg 25 b.i.d.  2. Altace 5 b.i.d.  3. Lasix 20 b.i.d.  4. Aldactone 25 daily.  5. Aspirin 325 daily.  6. Tylenol 1-2 tabs q.4-6h. p.r.n.   ACTIVITY AND WOUND CARE:  Per pacemaker discharge  sheet.  Patient was  restricted from driving for 10 days.  He was to follow with the Pacemaker  Clinic at [facility], February 2, at 9 a.m. and Dr. Graciela Husbands, February 23 at 9:15  a.m.  The patient was to continue following with Dr. Peter Swaziland.      Chinita Pester, C.R.N.P. [facility]                 Duke Salvia, M.D.    DS/MEDQ  D:  09/29/2003  T:  09/30/2003  Job:  [id number]   cc:   Duke Salvia, M.D.   [name], M.D.  [address], [address]  [place], [place] [address]  Fax: [phone]

## 2011-01-24 NOTE — H&P (Signed)
NAME:  Jonathon Avila, Jonathon Avila                        ACCOUNT NO.:  [id number]   MEDICAL RECORD NO.:  [id number]                   PATIENT TYPE:  INP   LOCATION:  1829                                 FACILITY:  [facility]   PHYSICIAN:  Colleen Can. Deborah Chalk, M.D.            DATE OF BIRTH:  1955-10-12   DATE OF ADMISSION:  01/18/2003  DATE OF DISCHARGE:                                HISTORY & PHYSICAL   CHIEF COMPLAINT:  Presyncope.   HISTORY OF PRESENT ILLNESS:  The patient is a very pleasant 55 year old  white male who presents to the emergency room today for elective admission.  He has known history of cardiomyopathy with LV dysfunction with congestive  heart failure.  He has basically had nonobstructive coronary disease per  recent cardiac catheterization.  He was seen and evaluated by Dr. Peter  Swaziland earlier this month wiht complaints of increasing shortness of breath.  He basically has been very active up until the end of February of this year  when he began to develop pulmonary symptoms.  He was seen in the emergency  room at [facility] where a chest x-ray showed evidence of CHF.  He had CT  scan of the chest which confirmed these findings.  He was evaluated by Dr.  Sandrea Hughs who felt that his symptoms were not pulmonary related but more  cardiac.  Over the past weekend, he has been started on Altace as well as  Lasix.  He was seen and evaluated by Dr. Peter Swaziland and was started on  Carvedilol.  He called the physician on call this past weekend with  complaints of palpitations.  He had a 2-D echocardiogram in the office this  past Monday, and at that time, he had a [employer] monitor placed.  He  began transmitting long runs of ventricular tachycardia since that time.  The patient has felt lightheaded.  He has had some diuresis with his current  medical regimen.  He is now admitted for further evaluation as well as for  EP studies.   PAST MEDICAL HISTORY:  1. CHF with most  recent 2-D echocardiogram performed on Monday, May 10,     showing mild LV enlargement with severe global hypokinesia and severely     depressed systolic function, marked paradoxical septal motion,     restrictive left ventricular filling with elevated filling pressures,     moderate to severe mitral insufficiency, mild pulmonary hypertension,     mild tricuspid insufficiency, and moderate left atrial enlargement.  2. Left bundle branch block.  3. Previous abnormal Cardiolite revealing evidence of anterior wall ischemia     with subsequent cardiac catheterization demonstrating only mild     nonobstructive disease.  4. Status post cholecystectomy.   ALLERGIES:  None.   CURRENT MEDICATIONS:  1. Lasix 40 mg a day.  2. Altace 5 mg a day.  3. Coreg 6.25 b.i.d.  4. Aspirin  daily.   FAMILY HISTORY:  Father is age 78 and has a history of angina.  Mother is 66  and sees a heart doctor as well.  He has one sister who has had previous A-V  block.   SOCIAL HISTORY:  He previously worked as a Electrical engineer for a shopping  center.  He is married and has two children.  He denies tobacco, alcohol, or  other drug use.   REVIEW OF SYSTEMS:  Basically as noted above.  He has had PND and orthopnea,  but this is improved with his current medical regimen.  He has had no chest  pain.  He has no history of stroke or TIAs and no claudication symptoms.  No  evidence of diabetes or hypertension.  Otherwise Review of Systems is as  noted above and otherwise unremarkable.   PHYSICAL EXAMINATION:  GENERAL:  He is an obese white male who appears older  than his stated age.  VITAL SIGNS:  Last documented weight was 224 pounds.  Blood pressure is  currently 121/84, heart rate in the 90s, respiratory rate 20.  SKIN:  Warm and dry.  His color is somewhat pale.  LUNGS:  Basically clear to auscultation.  CARDIAC:  Exam shows no evidence of an S3.  ABDOMEN:  Obese.  EXTREMITIES:  Edema bilaterally.   NEUROLOGIC:  Intact.  He has no gross focal deficits.   LABORATORY DATA:  Pertinent labs are pending.   EKG shows normal sinus rhythm with left bundle branch block.   Chest x-ray is pending.   OVERALL IMPRESSION:  1. Cardiomyopathy, most likely idiopathic.  2. Severe left ventricular dysfunction.  3. Congestive heart failure.  4. Left bundle branch block.  5. New onset ventricular tachycardia without frank syncope.  6. Obesity.    PLAN:  Will proceed on wiht admission to the hospital.  His current  medicines will be continued.  Will have Dr. Berton Mount see and evaluate the  patient as well.  It may be the patient would be a candidate for  biventricular pacemaker/AICD implantation.  For now, will hold off on  initiation of antiarrhythmics.  He will be monitored on telemetry.  Complete  labs will be assessed as well.     Juanell Fairly C. Earl Gala, N.P.                 Colleen Can. Deborah Chalk, M.D.    LCO/MEDQ  D:  01/18/2003  T:  01/18/2003  Job:  [id number]   cc:   [name], M.D.  [address], [address]  [place], [place] [address]  Fax: [phone]   Duke Salvia, M.D.   Talmadge Coventry, M.D.  [address], [address]  [place]  [place] [address]  Fax: [phone]

## 2011-01-24 NOTE — Discharge Summary (Signed)
NAME:  Jonathon Avila NO.:  [id number]   MEDICAL RECORD NO.:  [id number]                   PATIENT TYPE:  INP   LOCATION:  4730                                 FACILITY:  [facility]   PHYSICIAN:  [name], M.D.               DATE OF BIRTH:  [birth year]   DATE OF ADMISSION:  01/18/2003  DATE OF DISCHARGE:  01/24/2003                                 DISCHARGE SUMMARY   HISTORY OF PRESENT ILLNESS:  Jonathon Avila is a 55 year old white male who presented  with new onset of congestive heart failure. He has undergone previous  cardiac evaluation 2/03. A Cardiolite study at that time was abnormal and  showed global hypokinesia with ejection fraction of 37%. He subsequently  underwent cardiac catheterization which demonstrated no significant  obstructive coronary disease. His ejection fraction at that time was  reported as 65%. He did well until 2/04 and developed symptoms of increasing  shortness of breath and exertion. He was seen in the emergency room at [facility] where chest x-ray showed evidence of congestive heart failure.  He also had a CT scan of the chest which showed no evidence of pulmonary  emboli and also suggested congestive heart failure with small bilateral  pleural effusions and small pericardial effusion. He was initially referred  to pulmonary who subsequently recommended cardiac evaluation. The patient  also expressed some symptoms of fluttering in his chest and symptoms of  paroxysmal nocturnal dyspnea. He has no known history of hypertension,  diabetes, alcohol abuse, or prior chemotherapy. His only pertinent medical  history was previous cholecystectomy. The patient subsequently underwent an  echocardiogram. This demonstrated left ventricular enlargement with global  hypokinesia, ejection fraction of 30%. There is marked paradoxical septal  motion. There is also a marked restrictive left ventricular filling pattern.  He had moderate  to severe mitral insufficiency and mild pulmonary  hypertension. The patient had a [employer] monitor placed. On the night  prior to admission, the patient had an episode of what appeared to be a wide  complex tachycardia. He was admitted for further evaluation of his  tachycardia in the setting of a severe cardiomyopathy. The patient had no  prior history of syncope. For details of his past medical history, social  history, family history, and physical exam, please see admission history and  physical.   LABORATORY DATA:  Chest x-ray showed cardiomegaly with mild basilar  atelectasis. ECG showed normal sinus rhythm with a left bundle branch block.  White count was 15,500, hemoglobin 15.2, hematocrit 44.8, platelets 270,000.  PT was 18.3 with an INR of 1.6, PTT 29. Sodium 131, potassium 4.0, chloride  101, CO2 25, BUN 30, creatinine 1.3, glucose 94. AST was 183, ALT 270,  alkaline phosphatase was 72, total bilirubin is 2.2. Magnesium was normal at  2.5. Total protein was 6.4, albumin 3.6, calcium 8.8. CK was 94 with  negative MB, troponin 0.02. B-natriuretic peptic was only 284. TSH was  normal at 1.194.   HOSPITAL COURSE:  The patient was admitted to telemetry monitor transitional  care unit. He was seen in consultation by Dr. Sherryl Manges for  electrophysiology evaluation. It was felt that it was a possibility that the  wide complex tachycardia noted on his event monitor was artifact; however,  given his substrate, it was felt that he needed further evaluation. We  intensified the therapy of his congestive heart failure by increasing his  Altace to 5 mg twice a day. We attempted to increase his Coreg to 12.5 mg  b.i.d., but this was associated with symptoms of diaphoresis and smothering,  and so we reduced his dose back to 6.25 mg twice a day. He had no further  arrhythmias on telemetry. He did have some increasing lower extremity edema  with a baseline weight of 219 that increased  to 224. His diuretic dose was  increased to Lasix 40 mg b.i.d., and he was also placed on Aldactone 25 mg  per day. He seemed to diurese well with this, had lost down to 222 pounds at  the time of discharge. He also underwent an abdominal ultrasound because of  his abnormal liver function studies. His ultrasound was unremarkable. It was  felt that his elevated liver function tests were predominantly related to  hepatic congestion from his congestive heart failure. On 01/23/03, the  patient underwent EP study which showed no inducible ventricular or  supraventricular tachycardia with and without Isuprel. It was Dr. Odessa Fleming  recommendation that he have no further antiarrhythmic therapy at this point,  but he did mention possibly considering an ICD implant depending on the  results of sudden cardiac death HeFT trial data. At this point, we will  continue aggressive medical therapy of his cardiomyopathy, and the patient  was discharged home in stable condition.   DISCHARGE DIAGNOSES:  1. Dilated cardiomyopathy with congestive heart failure.  2. Left bundle branch block.  3. Elevated liver function tests secondary to hepatic congestion.  4. Normal electrophysiology study.   DISCHARGE MEDICATIONS:  1. Aspirin 325 mg per day.  2. Altace 5 mg b.i.d.  3. Coreg 6.25 mg b.i.d.  4. Lasix 40 mg b.i.d.  5. Aldactone 25 mg per day.   The patient is instructed in a low sodium diet. Also instructed to restrict  his fluid intake to less than 2 liters per day. He is instructed to weigh  himself daily and to report if he has more than 3-pound weight gain in one  day or more than 5-pound weight gain in a week. Discharge status is  improved. The patient will follow up with Dr. Swaziland.                                               [name], M.D.    PMJ/MEDQ  D:  01/24/2003  T:  01/24/2003  Job:  [id number]   cc:   Talmadge Coventry, M.D. [address]  [place]  [place] [address]   Fax: [phone]   Duke Salvia, M.D.

## 2011-01-24 NOTE — Op Note (Signed)
NAME:  Jonathon Avila, Jonathon Avila NO.:  [id number]   MEDICAL RECORD NO.:  [id number]                   PATIENT TYPE:  INP   LOCATION:  4730                                 FACILITY:  [facility]   PHYSICIAN:  Duke Salvia, M.D.               DATE OF BIRTH:  October 17, 1955   DATE OF PROCEDURE:  01/23/2003  DATE OF DISCHARGE:                                 OPERATIVE REPORT   PREOPERATIVE DIAGNOSIS:  Nonischemic cardiomyopathy, left bundle branch  block and tachy palpitations.   POSTOPERATIVE DIAGNOSIS:  No inducible supraventricular or ventricular  arrhythmias in the presence or in the absence of isoproterenol.   PROCEDURE PERFORMED:  Invasive electrophysiologic study.   DESCRIPTION OF PROCEDURE:  Following obtaining informed consent, the patient  was brought to the electrophysiology laboratory and placed on the  fluoroscopic table in the supine position.  After routine prep and drape,  cardiac catheterization was performed with local anesthesia and conscious  sedation.  Noninvasive blood pressure monitoring and transcutaneous oxygen  saturation monitoring and end tidal CO2 monitoring were performed  continuously throughout the procedure.  Following the procedure, the  catheters were removed.  Hemostasis was obtained and the patient was  transferred to the floor in stable condition.   CATHETERS:  A 5 French quadripolar catheter was inserted via the left  femoral vein to the AV junction.  A 5 French quadripolar catheter was  inserted via the left femoral vein to the right ventricular apex and  subsequently to the right ventricular outflow tract and then to the high  right atrium.   Surface leads I, aVF and V1 were monitored continuously throughout the  procedure.  Following insertion of the catheter the stimulation protocol  included incremental atrial pacing, incremental ventricular pacing, burst  atrial pacing to a cycle length of 200, ventricular burst pacing to  a cycle  length of 230 (2 to 1 VA block), single atrial extrastimuli at paced cycle  length of 600, 400 and 400:600 ms in the absence of isoproterenol, double  and triple extrastimuli from the right ventricular apex and right  ventricular outflow tract at a paced cycle length of 600 and 400 ms and  single and double ventricular extrastimuli from the right ventricular apex  at paced cycle length of 400:600 ms.   RESULTS:  Surface electrocardiogram, basic intervals.  Sinus.  Cycle length  of 681 ms, PR interval 174 ms, QRS duration 167 ms.  QT interval 450 ms. P-  wave duration 130 ms.  Bundle branch block is present--left.  Pre-excitation  is absent.   AH interval 69 ms.  HV interval 65 ms. His bundle duration 18 ms.   AV nodal function.  AV Wenckebach cycle length 320 ms.  VA Wenckebach was  410 ms.  Slow pathway conduction was evident based on the fact that there  was a PR interval longer than the R interval.  However,  discontinuity of AV  conduction was not identified.   Accessory pathway function.  No evidence of accessory pathway was  identified.   Ventricular response to programmed stimulation.  Refractory period paced cycle length of 400 ms from the right ventricular  apex was 280 ms and at 600 ms was 300 ms.  Effective refractory period from  left ventricular outflow tract at a paced cycle length of 515 ms was 300 ms  and at 400 ms was 270 ms.  Closest coupling intervals from right ventricular  apex was 300:250:220 and at 400 ms was 280:240:220.  The closest coupling  interval at the right ventricular outflow tract was 310:250:220 and at 400  ms was 280:230:210.  Refractory arrhythmias induced:  None.   IMPRESSION:  1. Normal sinus function.  2. Prolonged ventricular conduction times.  3. Slow antegrade AV nodal conduction without evidence of discontinuity.  4. Modest prolongation of His-Purkinje interval with left bundle branch     block.  5. No accessory pathway.  6.  Normal ventricular response to programmed stimulation.   CONCLUSION:  The results of electrophysiologic testing failed to identify a  substrate for the patient's tachy palpitations.  Given his nonischemic heart  disease, and his ejection fraction is now above 30%, there are no data  currently justifying implantation of a prophylactic defibrillator.   We will continue to follow him.  I would recommend repeat assessment of his  ejection fraction in a couple of months and if it has worsened, which I  would hope that it would not on the current medical regimen, would consider  defibrillatory implantation at that time.                                               Duke Salvia, M.D.    SCK/MEDQ  D:  01/23/2003  T:  01/24/2003  Job:  [id number]   cc:   [name], M.D.  [address], [address]  [place], [place] [address]  Fax: [phone]   Talmadge Coventry, M.D.  [address], [address]  [place]  [place] [address]  Fax: [phone]

## 2011-01-24 NOTE — Op Note (Signed)
NAME:  Jonathon Avila, Jonathon Avila NO.:  [id number]   MEDICAL RECORD NO.:  [id number]                   PATIENT TYPE:  OIB   LOCATION:  3715                                 FACILITY:  [facility]   PHYSICIAN:  Duke Salvia, M.D.               DATE OF BIRTH:  [birth year]   DATE OF PROCEDURE:  09/28/2003  DATE OF DISCHARGE:                                 OPERATIVE REPORT   PREOPERATIVE DIAGNOSIS:  Left bundle branch block, class 3 congestive heart  failure, and cardiomyopathy.   POSTOPERATIVE DIAGNOSIS:  Left bundle branch block, class 3 congestive heart  failure, and cardiomyopathy.   PROCEDURE:  Biventricular defibrillator implantation with coronary sinus  sonography and defibrillation threshold testing.   Following obtaining informed consent, the patient was brought to the  electrophysiology laboratory and placed on the fluoroscopy table in supine  position.  After routine prep and drape of the left upper chest, intravenous  contrast was injected via the left antecubital vein to identify the course  and the patency of the extrathoracic left subclavian vein.  This having been  accomplished, lidocaine was infiltrated over the prepectoral subclavicular  region.  An incision was made and carried down through the layers to the  prepectoral fascia using electrocautery and sharp dissection.  A pocket was  formed similarly.  Hemostasis was obtained.   Thereafter, attention was turned to gaining access to the extrathoracic left  subclavian vein which was accomplished without difficulty without the  aspiration of air or puncture of the artery.  Three separate venipunctures  were accomplished.  Guide-wires were placed and retained and a 0 silk suture  was placed around the two cephalad most wires and allowed to hang loosely.  Subsequently, a 7 French tear away introducer sheath was placed through  which was passed a Medtronic 6949, 65 cm, dual coil active defibrillator  lead, serial number [id number].  Under fluoroscopic guidance, it was  manipulated to the right ventricular apex where a bipolar R wave was 16.6  millivolts with a pacing impedance of 827 ohms and a pacing threshold of 0.5  milliseconds and 0.7 volts, current threshold was 1.8 MA, and there was no  diaphragmatic pacing at 10 volts.   This lead was then secured to the prepectoral fascia and a 9 French sheath  was placed over the caudal most retained guide-wire.  Through this was  placed a Medtronic MB2 sheath and a Wooly wire which was used to cannulate  the coronary sinus.  However, there was a twist at the origin of the  coronary sinus which precluded passing of the sheath the system actually  hopped out.  After cannulation of the coronary sinus again with a Wooly  wire, an 035 guide-wire was placed in a buddy orientation into the body of  the coronary sinus and this allowed for passage of the sheath into the body  of the CS.  Balloon  venography was undertaken demonstrating a large lateral  branch with posterior ramifications.  A Luge wire was utilized with the  Medtronic 4194, 88 cm, passive fixation bipolar left ventricular lead,  serial number [id number].  Under fluoroscopic guidance, it was manipulated  to the left ventricular lateral wall mid position between base and apex.  In  this location, the bipolar R wave was 5 millivolts with a pacing impedance  of 1319 ohms and a pacing threshold of 1.8 volts at 0.5 milliseconds,  current threshold was 2 MA.  There was no diaphragmatic pacing at 10 volts.   The 9 French sheath was removed.  The coronary sinus sheath was retained in  place and over the last retained guide-wire, a 7 French tear away introducer  sheath was placed through which was passed a Medtronic 5076, 52 cm, active  fixation atrial lead, serial number [id number].  Under fluoroscopic  guidance, it was manipulated to the right lateral wall, the right atrial  appendage not  being able to be easily embraced.  In this location, the  bipolar P wave was 4.4 millivolts with a pacing impedance of 894 ohms and a  pacing threshold of 0.6 volts at 0.5 milliseconds, current threshold was 1.1  MA, and there was no diaphragmatic pacing at 10 volts.  This lead was then  secured to the prepectoral fascia and attention was turned to removing the  CS delivery system.   The retained Luge wire was removed.  A stylet was placed into the body of  the coronary sinus and then the sheath was slit.  There was no visible  dislodgement of the lead and repeat assessments were stable.  This lead was  then secured to the prepectoral fascia and then all three leads were  attached to a Medtronic InSync Century 7297 cardiac resynchronization  defibrillator, serial number [id number].  Right ventricular pacing, P-  synchronous pacing, and then bi-V pacing were sequentially noted with  significant narrowing of the QRS.  The pocket was then copiously irrigated  with antibiotic containing saline solution.  Hemostasis was obtained and the  leads and pulse generator were placed in the pocket and secured to the  prepectoral fascia.   The patient was then sedated with a combination of Versed and Morphine (see  below).  Ventricular fibrillation was then induced via the T-wave shock.  After a total duration of 5 seconds, a 15 joule shock was delivered through  a measured resistance of 48 ohms in a standard polarity failing to terminate  ventricular fibrillation.  After a total duration of 11 seconds, a 20 joule  shock was delivered through a measured resistance of 49 ohms, again failing  to terminate ventricular fibrillation.  The patient was then rescued with a  360 joule external shock.  The total duration of the episode was 15 seconds.  After a wait of 5-6 minutes, ventricular fibrillation was reinduced via the  T-wave shock.  After a total duration of 7 seconds, a 30 joule shock was delivered  through a reversed polarity with an impedance of 50 ohms,  terminating ventricular fibrillation and restoring sinus rhythm.  After a  wait of 5-6 minutes, ventricular fibrillation was reinduced via the T-wave  shock.  Again, through reversed polarity, a 25 joule shock was delivered  through a measured resistance of 50 ohms terminating ventricular  fibrillation after duration of 6 seconds and restoring to sinus rhythm.   With these acceptable parameters recorded, although barely, the system was  implanted.  The wound was closed in three layers in a normal fashion.  The  wound was washed, dried, and a Benzoin and Steri-Strip dressing was applied.  Needle counts, sponge counts, and instrument counts were correct at the end  of the procedure according to the staff.  The patient's device is programmed  as a three zone device as 170/200/240.  Initial shocks in the VT zone are at  30 joules and in the VITAL SIGNS joules, all shocks are at 35 joules.   COMPLICATIONS:  The patient became nauseated after his deep sedation.  He  received Zofran but not withstanding this vomited a bilious liquid.  He was  awake at this time.  The patient had some complaints of a sore throat but no  other symptoms.  We will follow him closely for possible aspiration.                                               Duke Salvia, M.D.    SCK/MEDQ  D:  09/28/2003  T:  09/28/2003  Job:  [id number]   cc:   [name], M.D.  [address]  [place], [place] [address]  Fax: [phone]

## 2011-01-24 NOTE — Procedures (Signed)
[facility]  Patient:    Jonathon Avila, Jonathon Avila                     MRN: [id number] Proc. Date: 05/13/00 Adm. Date:  [id number] Disc. Date: [id number] Attending:  Louie Bun CC:         Talmadge Coventry, M.D.  [facility]   Procedure Report  PROCEDURE PERFORMED:  Colonoscopy.  ENDOSCOPIST:  Everardo All. Madilyn Fireman, M.D.  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  Procedure is to better assess the distal rectum and anus internally as well as rule out other causes of recent rectal bleeding and to evaluate the terminal ileum and colon for evidence of possible Crohns disease associated with fistula.  DESCRIPTION OF PROCEDURE:  The patient was placed in the left lateral decubitus position and placed on the pulse monitor with continuous low flow oxygen delivered by nasal cannula.  He was sedated with 50 mg IV Demerol and 7 mg IV Versed.  The Olympus video colonoscope was inserted into the rectum and advanced to the cecum, confirmed by transillumination of McBurneys point and visualization of the ileocecal valve and appendiceal orifice.  The prep was good.  The terminal ileum was then explored for several centimeters and appeared normal.  The cecum, ascending, transverse, descending and sigmoid colon all appeared normal with no masses, polyps, diverticula or other mucosal abnormalities.  The rectum likewise appeared normal proximally.  Retroflex view revealed a prominent whitish protuberance which appeared to possibly be very hypertrophied anal papilla although could have been an area of mucosa overlying a fistula or sinus tract.  This was also appreciated on straight withdrawal but I could not clearly appreciate any fistulous opening.  There were also some small internal hemorrhoids that appeared relatively unremarkable.  The scope  was then withdrawn and the patient returned to the recovery room in stable condition.  The patient tolerated the procedure well and there were no immediate complications.  IMPRESSION: 1. Abnormal mucosal protuberance of the anus unclear from examination todate    whether representing hypertrophied anal papilla or possible perianal    fistula or sinus tract, but suspect probably at least partially responsible    for his occasional fecal incontinence and rectal bleeding.  PLAN: Will have a general surgeon evaluate him with anoscopy and a second opinion regarding the nature of the lesion and need for any surgical correction. DD:  05/13/00 TD:  05/14/00 Job: [id number]

## 2011-01-24 NOTE — H&P (Signed)
NAME:  Jonathon Avila, Jonathon Avila                        ACCOUNT NO.:  [id number]   MEDICAL RECORD NO.:  [id number]                   PATIENT TYPE:  EMS   LOCATION:  MINO                                 FACILITY:   PHYSICIAN:  Trudi Ida. Denton Lank, M.D.               DATE OF BIRTH:  25-Jan-1956   DATE OF ADMISSION:  10/28/2002  DATE OF DISCHARGE:                                HISTORY & PHYSICAL   IMPRESSION:  1. Dyspnea on exertion; rather abrupt in onset this morning, identified with     ambulating up a tight incline and with minimal activity.  He had mild     associated left anterior chest discomfort with this. The patient did have     a prior cardiac catheterization 2/03 revealing essentially normal     coronary arteries except for a 30% LAD involving a diagonal branch.     Normal LV function.  2. History of intermittent left bundle branch block; history of same noted     during treadmill test last year.  3. Obesity.   PLAN:  1. Admit to telemetry for 23-hour obs __________.  2. Chest CT, rule out PE.  3. Serial cardiac enzymes; if three sets of enzymes track upward then would     anticipate cardiac catheterization.  If enzymes negative then discharge     to home in the morning.  4. Check chest x-ray, BNP, rule out/assess if evidence of congestive heart     failure.   HISTORY OF PRESENT ILLNESS:  Jonathon Avila is a very pleasant is a very  pleasant 55 year old obese gentleman with history of abnormal EKG; cardiac  work up last year involved stress Cardiolite which subsequently changed to  Adenosine Cardiolite secondary to development of stress induced left bundle  branch block.  His Cardiolite results at that time (2/03) were suspicious  for anterior ischemia with decrease ejection fraction.  Follow up revealed  essentially normal coronary arteries save for a 30% diagonal/LAD.   This morning the patient was ambulating up an incline, developed shortness  of breath which persisted with  mild exertion.  The patient noted mild left  anterior chest discomfort as well.  All symptoms improved with rest but were  recurrent with exertion.  He reported to his primary care M.D. where EKG  revealed left bundle branch block.  He was subsequently seen to the ER for  further evaluation.   IMPRESSION:  1. Chest pain.     a. On 2/03 cardiac catheterization revealed a 30% LAD involving diagonal        two.  Ejection fraction 65% (this heart catheterization was done to        follow up a false positive Adenosine Cardiolite revealing ischemia        anterior wall with EF 37%.  In hind site these abnormalities were        positive _________ with left bundle branch block.  2. Left bundle branch block noted during a stress part of chest     Cardiolite.  3. Dyslipidemia.  4. Hypertension.  5. Obesity.   SURGICAL HISTORY:  None.   ALLERGIES:  No known drug allergies.   MEDICATIONS:  None.   SOCIAL HISTORY:  Alcohol and tobacco negative.  He is married.  His wife  accompanies him.   FAMILY HISTORY:  Sister with A-V block, father is 30, mother age 40, alive  and well.   REVIEW OF SYSTEMS:  See history of present illness, otherwise essentially  benign.   PHYSICAL EXAMINATION:  VITAL SIGNS: Blood pressure 120/59, heart rate 68 and  regular, respiratory rate 12, his O2 saturation is 93% on room air.  GENERAL: He is a well-nourished, pleasant and conversant, obese gentleman in  no acute distress.  His wife is in attendance.  HEENT: Normal.  NECK: Supple without bruit. Bilateral carotid upstrokes without bruit.  No  JVD, no thyromegaly.  CHEST: Clear.  LUNGS: Clear, equal bilateral excursion.  CARDIAC: Regular rate and rhythm without murmur, rub, no gallop.  Normal S1,  S2.  Distal pulses intact, no pedal edema.  ABDOMINAL EXAM: Soft, obese, rotund, normal active bowel sounds.  Negative  abdominal aortic, no femoral bruit. Nontender to applied pressure.  No  masses, no organomegaly  appreciated.  NEUROLOGIC: Grossly nonfocal, alert and oriented x3.  GENITALIA/RECTAL EXAM: Deferred.   LABORATORY DATA:  EKG revealed normal sinus rhythm at 95 beats per minute  with left bundle branch block pattern. Coags,  magnesium, BNP, C-MET, CBC with differential were drawn and are pending at  time of this dictation.          [name], N.P.                       Trudi Ida Denton Lank, M.D.    MES/MEDQ  D:  10/28/2002  T:  10/28/2002  Job:  [id number]

## 2011-01-24 NOTE — Op Note (Signed)
Jonathon Avila, Jonathon Avila                       ACCOUNT NO.:  [id number]   MEDICAL RECORD NO.:  [id number]                   PATIENT TYPE:  AMB   LOCATION:  DAY                                  FACILITY:  [facility]   PHYSICIAN:  Timothy E. Earlene Plater, M.D.              DATE OF BIRTH:  04/14/1956   DATE OF PROCEDURE:  04/27/2002  DATE OF DISCHARGE:                                 OPERATIVE REPORT   PREOPERATIVE DIAGNOSIS:  Cholecystolithiasis.   POSTOPERATIVE DIAGNOSIS:  Cholecystolithiasis.   OPERATIVE PROCEDURE:  Laparoscopic cholecystectomy and operative  cholangiogram.   SURGEON:  Timothy E. Earlene Plater, M.D.   ASSISTANT:  Currie Paris, M.D.   ANESTHESIA:  CRNA supervised M.D.   INDICATIONS FOR PROCEDURE:  The patient is 64, otherwise healthy.  Has  developed postprandial right upper quadrant pain, and this has been  particularly been while he is on a diet and losing weight.  After careful  discussion, he wishes to proceed with a removal of the gallbladder, as has  been carefully discussed.  He was evaluated by anesthesia, identified, and  operative permit signed in the preop area.   DESCRIPTION OF PROCEDURE:  He was taken to the operating room, placed  supine, general endotracheal anesthesia administered.  The abdomen was  prepped and draped in the usual fashion.  Marcaine 0.5% with epinephrine was  used prior to each incision.  An infraumbilical incision made, the fascia  identified, opened in the midline, the peritoneum entered without  complication, a suture placed, trocar placed through the suture and tied in  place.  The abdomen insufflated.  General peritoneoscopy was completely  unremarkable except for a scarred, thickened gallbladder.  A second 10 mm  trocar placed in the mid epigastrium and two 5 mm trocars in the right upper  quadrant.  The gallbladder was grasped.  The dense adhesions of the omentum  were taken down carefully with cautery, and the gallbladder  identified in  its entirety.  At the base of the gallbladder, blunt dissection removed the  fatty tissue, and cystic duct was identified and completely cleared.  Behind  that was a single cystic artery.  Window was created, and a A clip was  placed on the gallbladder side of the cystic duct, a small incision made in  the cystic duct and using the cholangiogram catheter in real-time  fluoroscopy, a cholangiogram was accomplished showing smooth and rapid  filling of the duodenum as well the common bile duct and hepatic radicles.  Catheter removed, the cystic duct remnant doubly clipped, and the cystic  duct fully divided.  The artery was isolated, triply clipped, and divided.  The gallbladder was removed from the gallbladder bed, one small nick made in  the gallbladder with minimal spillage of bile, no stones.  The gallbladder  was then placed in an EndoCatch bag and the gallbladder bed carefully  inspected, and cautery was used until dry.  There was no bleeding and no  bile.  The gallbladder was removed through the infraumbilical incision, a  second suture placed at that incision under direct vision and tied intact.  Copious irrigation was carried out until clear. Again, the gallbladder bed  inspected and was dry.  All  instruments, trocar, CO2, and irrigant removed under direct vision.  The  skin incisions were closed with 3-0 Monocryl.  Steri-Strips and dry sterile  dressing applied.  Final counts correct.  He tolerated it well, was  awakened, and taken to the recovery room in good condition.                                               Timothy E. Earlene Plater, M.D.    TED/MEDQ  D:  04/27/2002  T:  04/27/2002  Job:  [id number]   cc:   Talmadge Coventry, M.D.   Barrie Folk, M.D.

## 2011-01-24 NOTE — Consult Note (Signed)
NAME:  Jonathon Avila, Jonathon Avila NO.:  [id number]   MEDICAL RECORD NO.:  [id number]                   PATIENT TYPE:  INP   LOCATION:  2908                                 FACILITY:  [facility]   PHYSICIAN:  Duke Salvia, M.D.               DATE OF BIRTH:  1956-06-19   DATE OF CONSULTATION:  DATE OF DISCHARGE:                                   CONSULTATION   Thank you very much for asking me to see the patient in electrophysiological  consultation for tachy palpitations with telemetry strips suggesting wide  complex tachycardia.   HISTORY OF PRESENT ILLNESS:  The patient is a 55 year old gentleman who was  seen last year and on physical examination was found to have an abnormal  electrocardiogram.  Is admitted for Cardiolite scanning which demonstrated  anterior perfusion defects and ejection fraction was 35%.  He was admitted  for a catheterization by Meade Maw, M.D. which demonstrated clean  coronaries and an ejection fraction that was read as normal.   Since February 20 he has had progressive shortness of breath, exercise  intolerance, rest dyspnea, and some peripheral edema.  He was seen in the  emergency room a week or two ago and had a radiographic evaluation which was  unrevealing.  He saw Talmadge Coventry, M.D. and referred to Peter M.  [place], M.D. who undertook an echocardiogram and found his ejection fraction  to be about 30% with bi-atrial enlargement and some evidence of pulmonary  hypertension.   Because of tachy palpitations he was given an event recorder which last  night recorded a wide complex pattern that was felt to be consistent with  ventricular tachycardia (see below).  The patient did not have syncope with  this and the patient did not have syncope with parts or portion of the strip  that suggested asystole.   PAST MEDICAL HISTORY:  Otherwise largely negative.   REVIEW OF SYSTEMS:  As noted on the intake sheet today from Chinita Pester and  is not recounted here.   SOCIAL HISTORY:  He is married.  He has two children.  He works as a  Electrical engineer at [employer].  He does not use cigarettes, alcohol, or  recreational drugs.   MEDICATIONS:  1. Coreg 6.25 b.i.d.  2. Aspirin.  3. Lasix 40 daily.  4. Altace 5 daily.   PAST SURGICAL HISTORY:  Cholecystectomy.   ALLERGIES:  He has no known drug allergies.   PHYSICAL EXAMINATION:  GENERAL:  He is a middle aged Caucasian male in no  acute distress.  VITAL SIGNS:  Blood pressure 121/84, pulse 100.  HEENT:  No scleral icterus nor xanthomata.  NECK:  Neck veins were 10-11 cm.  His carotids were diminished, but without  bruits.  His neck was remarkable for skin tags.  BACK:  Without kyphosis, scoliosis.  There were bibasilar crackles.  The PMI  was  displaced.  S1 was diminished.  S2 was mildly widely split.  S3 was  present.  ABDOMEN:  Soft with active bowel sounds without midline pulsations,  hepatomegaly.  EXTREMITIES:  Femoral pulses were 2+.  Distal pulses were intact.  There was  no clubbing, cyanosis.  There is 2+ edema.  Capillary refill was  approximately three seconds.   LABORATORIES:  Electrocardiogram demonstrated sinus rhythm at 95 with  interval of 0.17/0.16/0.40 with an axis of 75 degrees with a left bundle  branch block pattern.   Telemetry strips had a wide complex pattern on them.  However, the duration  of the wide complex of some strips are such that they should have been  associated with syncope and the relationship of the wide complex with clear  artifact, i.e. asystole, particularly on fax page 6/8 also suggest that this  represents artifact.   IMPRESSION:  1. Tachy palpitations.  2. Wide complex rhythm strip most consistent with artifact, I doubt it is     real.  3. Non-ischemic cardiomyopathy.     a. Ejection fraction was 30%.     b. Normal coronaries.  4. 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  5. Left bundle branch block.    DISCUSSION:  The patient has tachy palpitations.  The telemetry strips that  are recorded, I think, though, are artifact.  However, with his palpitations  they are very concerning and in-hospital monitoring, I think, is most  appropriate.   Aggressive treatment for his CHF is indicated.  I think given the monitor  strips and in the event that nothing materializes on the monitor,  electrophysiological risk stratification would be appropriate.  This could  include T-wave alternans testing as well as EP testing.  In the event that  these are abnormal and given the __________ data, I  would have a low threshold for implantation of an ICD.  If this were  undertaken he may well benefit from a BiV device given his congestive  failure and his left bundle branch block.   Thank you for this consultation.                                               Duke Salvia, M.D.    SCK/MEDQ  D:  01/18/2003  T:  01/19/2003  Job:  [id number]   cc:   [name], M.D.  [address], [address]  [place], [place] [address]  Fax: [phone]   Talmadge Coventry, M.D.  [address]  [place]  [place] [address]  Fax: [phone]

## 2011-01-24 NOTE — Cardiovascular Report (Signed)
[place]  Patient:    KODI, STEIL Visit Number: [id number] MRN: [id number]          Service Type: CAT Location: [facility] 2852 01 Attending Physician:  Mora Appl Dictated by:   Meade Maw, M.D. Proc. Date: 10/25/01 Admit Date:  10/25/2001   CC:         Talmadge Coventry, M.D.   Cardiac Catheterization  INDICATIONS:  Reversible ischemia on Cardiolite.  BRIEF HISTORY:  Joanathan Affeldt is a 55 year old gentleman noted to have new P wave inversions on his precordial leads.  In view of his risk factors, family history, dyslipidemia, hypertension, male sex, and obesity, stress Cardiolite was performed.  During the stress portion of the Cardiolite, he developed a left bundle branch block.  The stress was subsequently converted to an Adenosine.  He was noted to have reversible ischemia on the anterior wall with an ejection fraction of 37%.  Based on these findings, it was elected to proceed with left heart catheterization.  DESCRIPTION OF PROCEDURE:  After obtaining written informed consent, the patient was brought to the cardiac catheterization in the postabsorptive state.  Preoperative sedation was achieved using IV Versed.  The right groin was prepped and draped in the usual sterile fashion.  Local anesthesia was achieved using 1% lidocaine and multiple views were performed using JL4 and JR4 Judkins catheters.  Single plane ventriculogram was performed in the RAO position using a 6 French pigtail curved catheter.  There was no critical disease identified.  The patient was transferred to the holding area.  The hemostasis sheath was removed.  Hemostasis was achieved using digital pressure. All catheter exchanges were made over a guide wire.  The hemostasis sheath was flushed following each engagement.  FINDINGS:  The aortic pressure was 145/78, LV pressure was 144/18.  There was no gradient noted on pullback.  Single plane  ventriculogram revealed normal wall motion, ejection fraction of approximately 65%.  CORONARY ANGIOGRAPHY: 1. The left main coronary artery bifurcates into the left anterior descending    and circumflex vessel.  There was no significant disease in the left main    coronary artery or its branches. 2. The left anterior descending gives rise to a large D1 and large D2 and    then goes on to end as a large apical recurrent branch.  Following D2 and    involving D2, there is a 30% luminal irregularity. 3. The circumflex vessel is a large vessel that gives rise to a trivial OM1,    moderate OM2, small OM3.  There is no significant disease in the circumflex    or its branches. 4. The right coronary artery is a large dominant artery giving rise to    several small RV marginals, large PDA, and a PL branch.  There was no    significant disease in the right coronary artery or its branches.  IMPRESSION:  Noncritical disease in the distal left anterior descending following the second diagonal, false positive Adenosine Cardiolite most likely related to left bundle branch block.  In view of his noncritical disease in his LAD, his LDL goal is less than 100. Dictated by:   Meade Maw, M.D. Attending Physician:  Meade Maw A DD:  10/25/01 TD:  10/25/01 Job: [id number]

## 2011-02-20 IMAGING — CR DG CHEST 2V
2 series · 2 of 2 positions shown · non-contrast
Comparison: None

CLINICAL DATA: History given of hypertension.  History of ICD
change of the leads and generator.

CHEST - 2 VIEW

[view not recorded (1 of 2)]
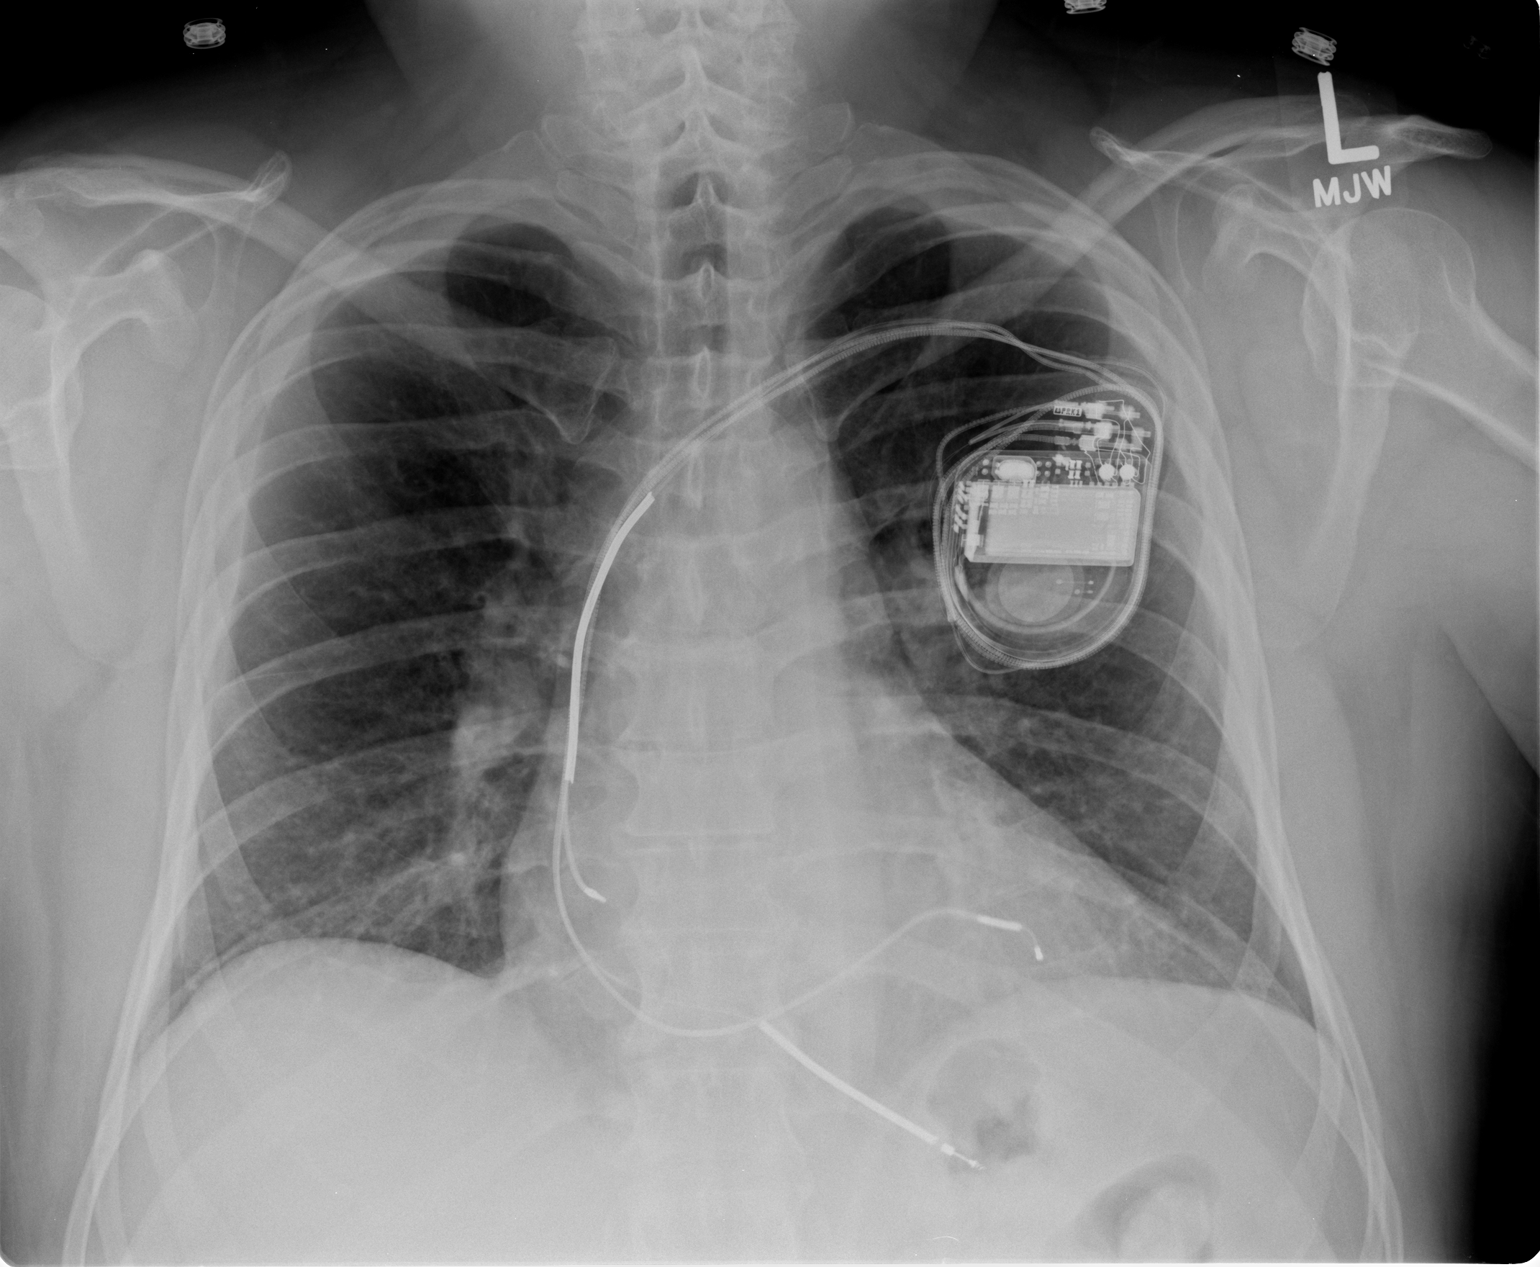

[view not recorded (2 of 2)]
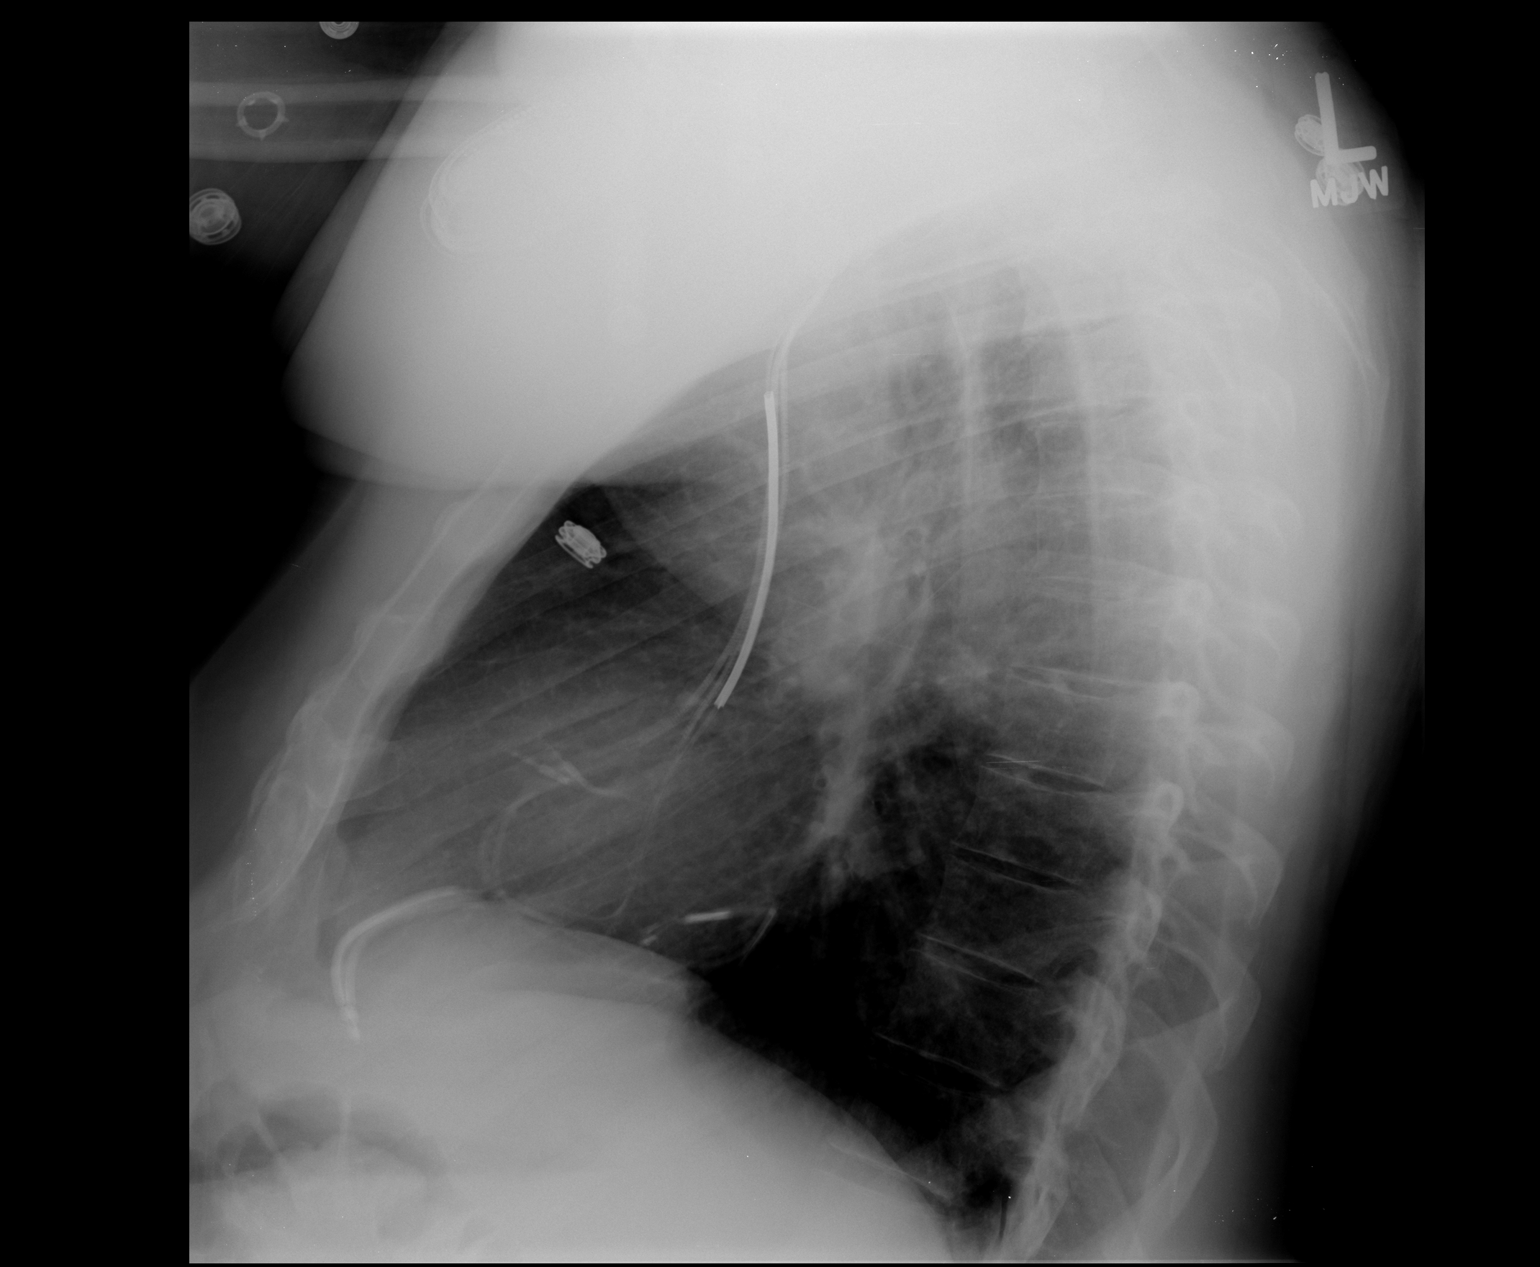

[2 of 2 positions shown; findings below may reference images not displayed]

FINDINGS: AICD is in place with controller device on the left.
There are multiple leads.  Cardiac silhouette is upper limits of
normal size.  No pulmonary edema, pneumonia, or pleural effusion is
seen.  Pleural thickening or subpleural fat is evident
symmetrically in the upper thorax. There is osteophyte formation in
the spine.
IMPRESSION: AICD device is in place.  Cardiac silhouette is upper normal size
and evidence of pulmonary edema, pneumonia, or pleural effusion.

## 2011-03-22 ENCOUNTER — Other Ambulatory Visit: Payer: Self-pay | Admitting: Cardiology

## 2011-03-24 NOTE — Telephone Encounter (Signed)
escribe medication per fax request  

## 2011-03-25 ENCOUNTER — Other Ambulatory Visit: Payer: Self-pay | Admitting: *Deleted

## 2011-03-25 MED ORDER — LISINOPRIL 10 MG PO TABS: 10.0000 mg | ORAL_TABLET | Freq: Every day | ORAL | Status: DC

## 2011-03-25 NOTE — Telephone Encounter (Signed)
escribe medication per fax request  

## 2011-04-21 ENCOUNTER — Other Ambulatory Visit: Payer: Self-pay | Admitting: Cardiology

## 2011-04-21 NOTE — Telephone Encounter (Signed)
escribe medication per fax request  

## 2011-04-22 ENCOUNTER — Encounter: Payer: Self-pay | Admitting: *Deleted

## 2011-04-24 ENCOUNTER — Encounter: Payer: Self-pay | Admitting: Cardiology

## 2011-05-08 ENCOUNTER — Ambulatory Visit: Payer: Medicare Other | Admitting: Cardiovascular Disease

## 2011-05-08 ENCOUNTER — Ambulatory Visit (INDEPENDENT_AMBULATORY_CARE_PROVIDER_SITE_OTHER): Payer: Medicare Other | Admitting: *Deleted

## 2011-05-08 ENCOUNTER — Encounter: Payer: Self-pay | Admitting: Internal Medicine

## 2011-05-08 DIAGNOSIS — I428 Other cardiomyopathies: Secondary | ICD-10-CM

## 2011-05-08 DIAGNOSIS — I5022 Chronic systolic (congestive) heart failure: Secondary | ICD-10-CM

## 2011-05-08 LAB — ICD DEVICE OBSERVATION
AL IMPEDENCE ICD: 437 Ohm
BRDY-0003LV: 130 {beats}/min
CHARGE TIME: 9.629 s
LV LEAD IMPEDENCE ICD: 684 Ohm
PACEART VT: 0
RV LEAD AMPLITUDE: 12.125 mv
RV LEAD IMPEDENCE ICD: 684 Ohm
TOT-0001: 1
TOT-0002: 0
TZAT-0001ATACH: 1
TZAT-0001ATACH: 2
TZAT-0001ATACH: 3
TZAT-0001SLOWVT: 1
TZAT-0001SLOWVT: 2
TZAT-0002ATACH: NEGATIVE
TZAT-0002ATACH: NEGATIVE
TZAT-0002FASTVT: NEGATIVE
TZAT-0004SLOWVT: 8
TZAT-0004SLOWVT: 8
TZAT-0005SLOWVT: 88 pct
TZAT-0005SLOWVT: 91 pct
TZAT-0012ATACH: 150 ms
TZAT-0018ATACH: NEGATIVE
TZAT-0018ATACH: NEGATIVE
TZAT-0018ATACH: NEGATIVE
TZAT-0018FASTVT: NEGATIVE
TZAT-0019ATACH: 6 V
TZAT-0019ATACH: 6 V
TZAT-0019FASTVT: 8 V
TZAT-0020ATACH: 1.5 ms
TZAT-0020SLOWVT: 1.5 ms
TZON-0003VSLOWVT: 450 ms
TZON-0004VSLOWVT: 20
TZST-0001ATACH: 6
TZST-0001FASTVT: 5
TZST-0001FASTVT: 6
TZST-0001SLOWVT: 3
TZST-0001SLOWVT: 5
TZST-0001SLOWVT: 6
TZST-0002ATACH: NEGATIVE
TZST-0002FASTVT: NEGATIVE
TZST-0002FASTVT: NEGATIVE
TZST-0002FASTVT: NEGATIVE
TZST-0002FASTVT: NEGATIVE
TZST-0003SLOWVT: 35 J

## 2011-05-08 NOTE — Progress Notes (Signed)
ICD interrogation with ICM 

## 2011-05-13 ENCOUNTER — Ambulatory Visit (INDEPENDENT_AMBULATORY_CARE_PROVIDER_SITE_OTHER): Payer: 59 | Admitting: Cardiology

## 2011-05-13 ENCOUNTER — Encounter: Payer: Self-pay | Admitting: Cardiology

## 2011-05-13 VITALS — BP 124/70 | HR 86 | Ht 68.0 in | Wt 235.8 lb

## 2011-05-13 DIAGNOSIS — I420 Dilated cardiomyopathy: Secondary | ICD-10-CM

## 2011-05-13 DIAGNOSIS — I447 Left bundle-branch block, unspecified: Secondary | ICD-10-CM | POA: Insufficient documentation

## 2011-05-13 DIAGNOSIS — I509 Heart failure, unspecified: Secondary | ICD-10-CM

## 2011-05-13 DIAGNOSIS — I428 Other cardiomyopathies: Secondary | ICD-10-CM | POA: Insufficient documentation

## 2011-05-13 DIAGNOSIS — I5022 Chronic systolic (congestive) heart failure: Secondary | ICD-10-CM

## 2011-05-13 DIAGNOSIS — E785 Hyperlipidemia, unspecified: Secondary | ICD-10-CM

## 2011-05-13 NOTE — Assessment & Plan Note (Signed)
He is minimally symptomatic. He is on appropriate medical therapy. We will schedule him for a followup echocardiogram.

## 2011-05-13 NOTE — Patient Instructions (Signed)
Continue your current medications.  We will schedule you for an echocardiogram.  I will see you again in 6 months.

## 2011-05-13 NOTE — Progress Notes (Signed)
   Jonathon Avila Date of Birth: 10-22-1955   History of Present Illness: Jonathon Avila is seen  today for followup. Jonathon Avila states Jonathon Avila has been feeling very well. Jonathon Avila remains active. Jonathon Avila gets exercise at least 4 days a week. Jonathon Avila denies any dyspnea or chest pain. Jonathon Avila's had no palpitations or dizziness. Jonathon Avila has been working on his diet and has lost 14 pounds by counting carbs.   Current Outpatient Prescriptions on File Prior to Visit  Medication Sig Dispense Refill  . aspirin 325 MG EC tablet Take 325 mg by mouth daily.        . carvedilol (COREG) 25 MG tablet TAKE ONE TABLET BY MOUTH TWICE DAILY  60 tablet  5  . cholecalciferol (VITAMIN D) 1000 UNITS tablet Take 1,000 Units by mouth 2 (two) times daily.       [name] lisinopril (PRINIVIL,ZESTRIL) 10 MG tablet Take 1 tablet (10 mg total) by mouth daily.  30 tablet  5  . simvastatin (ZOCOR) 40 MG tablet Take 40 mg by mouth at bedtime.        [name] spironolactone (ALDACTONE) 25 MG tablet TAKE ONE TABLET BY MOUTH AT BEDTIME  30 tablet  5    Allergies  Allergen Reactions  . Codeine   . Shellfish Allergy     Past Medical History  Diagnosis Date  . Hyperlipidemia   . LBBB (left bundle branch block)   . Nonischemic dilated cardiomyopathy   . Mitral insufficiency   . Pulmonary hypertension   . CHF (congestive heart failure)   . LV dysfunction     Past Surgical History  Procedure Date  . Cardiac catheterization 10/25/2001    EF 65%  . Cholecystectomy   . Icd     BIVENTRICULAR  . US echocardiography 02/19/2009    EF 45%    History  Smoking status  . Never Smoker   Smokeless tobacco  . Not on file    History  Alcohol Use No    History reviewed. No pertinent family history.  Review of Systems: The review of systems is positive for  weight loss.  All other systems were reviewed and are negative.  Physical Exam: BP 124/70  Pulse 86  Ht 5\' 8"  (1.727 m)  Wt 235 lb 12.8 oz (106.958 kg)  BMI 35.85 kg/m2  is an overweight white male in no acute  distress.The patient is alert and oriented x 3.  The mood and affect are normal.  The skin is warm and dry.  Color is normal.  The HEENT exam reveals that the sclera are nonicteric.  The mucous membranes are moist.  The carotids are 2+ without bruits.  There is no thyromegaly.  There is no JVD.  The lungs are clear.  The chest wall is non tender.  The heart exam reveals a regular rate with a normal S1 and S2.  There are no murmurs, gallops, or rubs.  The PMI is not displaced.   Abdominal exam reveals good bowel sounds.  There is no guarding or rebound.  There is no hepatosplenomegaly or tenderness.  There are no masses.  Exam of the legs reveal no clubbing, cyanosis, or edema.  The legs are without rashes.  The distal pulses are intact.  Cranial nerves II - XII are intact.  Motor and sensory functions are intact.  The gait is normal. LABORATORY DATA:   Assessment / Plan:

## 2011-05-13 NOTE — Assessment & Plan Note (Signed)
Status post biventricular ICD placement.

## 2011-05-15 ENCOUNTER — Ambulatory Visit (HOSPITAL_COMMUNITY): Payer: 59 | Attending: Cardiology | Admitting: Radiology

## 2011-05-15 DIAGNOSIS — I428 Other cardiomyopathies: Secondary | ICD-10-CM | POA: Insufficient documentation

## 2011-05-15 DIAGNOSIS — I079 Rheumatic tricuspid valve disease, unspecified: Secondary | ICD-10-CM | POA: Insufficient documentation

## 2011-05-15 DIAGNOSIS — I059 Rheumatic mitral valve disease, unspecified: Secondary | ICD-10-CM | POA: Insufficient documentation

## 2011-05-15 DIAGNOSIS — I420 Dilated cardiomyopathy: Secondary | ICD-10-CM

## 2011-05-15 DIAGNOSIS — E785 Hyperlipidemia, unspecified: Secondary | ICD-10-CM | POA: Insufficient documentation

## 2011-05-15 DIAGNOSIS — I509 Heart failure, unspecified: Secondary | ICD-10-CM | POA: Insufficient documentation

## 2011-05-15 DIAGNOSIS — I2789 Other specified pulmonary heart diseases: Secondary | ICD-10-CM | POA: Insufficient documentation

## 2011-05-16 ENCOUNTER — Telehealth: Payer: Self-pay | Admitting: *Deleted

## 2011-05-16 NOTE — Telephone Encounter (Signed)
Notified of Echo results. Will send copy to Dr. Duanne Guess

## 2011-05-16 NOTE — Telephone Encounter (Signed)
Message copied by Lorayne Bender on Fri May 16, 2011  2:36 PM ------      Message from: [place], PETER M      Created: Fri May 16, 2011 12:43 PM       EF is a little lower than last study but still in the moderately reduced range. Continue same Rx.      Theron Arista [place]

## 2011-05-16 NOTE — Telephone Encounter (Signed)
Notified of Echo results

## 2011-05-16 NOTE — Telephone Encounter (Signed)
Message copied by Lorayne Bender on Fri May 16, 2011  4:32 PM ------      Message from: [place], PETER M      Created: Fri May 16, 2011 12:43 PM       EF is a little lower than last study but still in the moderately reduced range. Continue same Rx.      Theron Arista [place]

## 2011-05-19 ENCOUNTER — Telehealth: Payer: Self-pay | Admitting: Cardiology

## 2011-05-19 NOTE — Telephone Encounter (Signed)
Pt states had ECHO done last week.  Pt would like to know if ECHO got sent to Dr. Graciela Husbands.  Pt sees him Oct 2.  Please call pt back to confirm.

## 2011-05-19 NOTE — Telephone Encounter (Signed)
Advised Dr.Klein is in same system so he has access to results of Echo.

## 2011-06-10 ENCOUNTER — Encounter: Payer: Self-pay | Admitting: Internal Medicine

## 2011-06-10 ENCOUNTER — Ambulatory Visit (INDEPENDENT_AMBULATORY_CARE_PROVIDER_SITE_OTHER): Payer: 59 | Admitting: Internal Medicine

## 2011-06-10 DIAGNOSIS — I428 Other cardiomyopathies: Secondary | ICD-10-CM

## 2011-06-10 DIAGNOSIS — Z95810 Presence of automatic (implantable) cardiac defibrillator: Secondary | ICD-10-CM

## 2011-06-10 DIAGNOSIS — I5022 Chronic systolic (congestive) heart failure: Secondary | ICD-10-CM

## 2011-06-10 DIAGNOSIS — T82198A Other mechanical complication of other cardiac electronic device, initial encounter: Secondary | ICD-10-CM | POA: Insufficient documentation

## 2011-06-10 DIAGNOSIS — I420 Dilated cardiomyopathy: Secondary | ICD-10-CM

## 2011-06-10 LAB — ICD DEVICE OBSERVATION
AL AMPLITUDE: 5.875 mv
AL IMPEDENCE ICD: 437 Ohm
AL THRESHOLD: 0.5 v
ATRIAL PACING ICD: 37.37 pct
BAMS-0001: 170 {beats}/min
BATTERY VOLTAGE: 3.0972 v
CHARGE TIME: 9.629 s
FVT: 0
LV LEAD IMPEDENCE ICD: 646 Ohm
LV LEAD THRESHOLD: 0.875 v
PACEART VT: 0
RV LEAD AMPLITUDE: 11.5 mv
RV LEAD IMPEDENCE ICD: 703 Ohm
RV LEAD THRESHOLD: 0.75 v
TOT-0001: 1
TOT-0002: 0
TOT-0006: 20100826000000
TZAT-0001ATACH: 1
TZAT-0001ATACH: 2
TZAT-0001ATACH: 3
TZAT-0001FASTVT: 1
TZAT-0001SLOWVT: 1
TZAT-0001SLOWVT: 2
TZAT-0002ATACH: NEGATIVE
TZAT-0002ATACH: NEGATIVE
TZAT-0002ATACH: NEGATIVE
TZAT-0002FASTVT: NEGATIVE
TZAT-0004SLOWVT: 8
TZAT-0004SLOWVT: 8
TZAT-0005SLOWVT: 88 pct
TZAT-0005SLOWVT: 91 pct
TZAT-0011SLOWVT: 10 ms
TZAT-0011SLOWVT: 10 ms
TZAT-0012ATACH: 150 ms
TZAT-0012ATACH: 150 ms
TZAT-0012ATACH: 150 ms
TZAT-0012FASTVT: 200 ms
TZAT-0012SLOWVT: 200 ms
TZAT-0012SLOWVT: 200 ms
TZAT-0013SLOWVT: 1
TZAT-0013SLOWVT: 2
TZAT-0018ATACH: NEGATIVE
TZAT-0018ATACH: NEGATIVE
TZAT-0018ATACH: NEGATIVE
TZAT-0018FASTVT: NEGATIVE
TZAT-0018SLOWVT: NEGATIVE
TZAT-0018SLOWVT: NEGATIVE
TZAT-0019ATACH: 6 v
TZAT-0019ATACH: 6 v
TZAT-0019ATACH: 6 v
TZAT-0019FASTVT: 8 v
TZAT-0019SLOWVT: 8 v
TZAT-0019SLOWVT: 8 v
TZAT-0020ATACH: 1.5 ms
TZAT-0020ATACH: 1.5 ms
TZAT-0020ATACH: 1.5 ms
TZAT-0020FASTVT: 1.5 ms
TZAT-0020SLOWVT: 1.5 ms
TZAT-0020SLOWVT: 1.5 ms
TZON-0003ATACH: 350 ms
TZON-0003SLOWVT: 350 ms
TZON-0003VSLOWVT: 450 ms
TZON-0004SLOWVT: 20
TZON-0004VSLOWVT: 20
TZON-0005SLOWVT: 12
TZST-0001ATACH: 4
TZST-0001ATACH: 5
TZST-0001ATACH: 6
TZST-0001FASTVT: 2
TZST-0001FASTVT: 3
TZST-0001FASTVT: 4
TZST-0001FASTVT: 5
TZST-0001FASTVT: 6
TZST-0001SLOWVT: 3
TZST-0001SLOWVT: 4
TZST-0001SLOWVT: 5
TZST-0001SLOWVT: 6
TZST-0002ATACH: NEGATIVE
TZST-0002ATACH: NEGATIVE
TZST-0002ATACH: NEGATIVE
TZST-0002FASTVT: NEGATIVE
TZST-0002FASTVT: NEGATIVE
TZST-0002FASTVT: NEGATIVE
TZST-0002FASTVT: NEGATIVE
TZST-0002FASTVT: NEGATIVE
TZST-0003SLOWVT: 25 J
TZST-0003SLOWVT: 35 J
TZST-0003SLOWVT: 35 J
TZST-0003SLOWVT: 35 J
VENTRICULAR PACING ICD: 100 pct
VF: 0

## 2011-06-10 NOTE — Assessment & Plan Note (Signed)
Still at class II.options include echo optimization

## 2011-06-10 NOTE — Progress Notes (Signed)
  HPI  Jonathon Avila is a 55 y.o. male  seen in followup for a CRT-D. implanted for congestive heart failure in the setting of nonischemic heart disease. He is doing pretty well. He remains class II. He has modest shortness of breath. He recently underwent echo by Dr. [place] demonstrated some recent mild worsening of ejection fraction down from 40-45 to 35-40.  He has lost 35 pounds since the beginning of the year. His wife has lost 110 pounds via gastric bypass.   Past Medical History  Diagnosis Date  . Hyperlipidemia   . LBBB (left bundle branch block)   . Nonischemic dilated cardiomyopathy   . Mitral insufficiency   . Pulmonary hypertension   . CHF (congestive heart failure)   . LV dysfunction     Past Surgical History  Procedure Date  . Cardiac catheterization 10/25/2001    EF 65%  . Cholecystectomy   . Icd     BIVENTRICULAR  . US echocardiography 02/19/2009    EF 45%    Current Outpatient Prescriptions  Medication Sig Dispense Refill  . aspirin 325 MG EC tablet Take 325 mg by mouth daily.        . carvedilol (COREG) 25 MG tablet TAKE ONE TABLET BY MOUTH TWICE DAILY  60 tablet  5  . cholecalciferol (VITAMIN D) 1000 UNITS tablet Take 1,000 Units by mouth 2 (two) times daily.       [name] lisinopril (PRINIVIL,ZESTRIL) 10 MG tablet Take 1 tablet (10 mg total) by mouth daily.  30 tablet  5  . simvastatin (ZOCOR) 40 MG tablet Take 40 mg by mouth at bedtime.        [name] spironolactone (ALDACTONE) 25 MG tablet TAKE ONE TABLET BY MOUTH AT BEDTIME  30 tablet  5    Allergies  Allergen Reactions  . Codeine   . Shellfish Allergy     Review of Systems negative except from HPI and PMH  Physical Exam Well developed and well nourished in no acute distress HENT normal E scleral and icterus clear Neck Supple JVP flat; carotids brisk and full Clear to ausculation Regular rate and rhythm, no murmurs gallops or rub Soft with active bowel sounds No clubbing cyanosis and  edema Alert and oriented, grossly normal motor and sensory function Skin Warm and Dry   Assessment and  Plan

## 2011-06-10 NOTE — Assessment & Plan Note (Signed)
The patient's device was interrogated.  The information was reviewed. No changes were made in the programming.    

## 2011-06-10 NOTE — Assessment & Plan Note (Signed)
Stable on current medications. One thing that we could consider is AVOptimization by echo. I will leave this to Dr. Elvis Coil discretion

## 2011-06-10 NOTE — Patient Instructions (Signed)
Your physician recommends that you schedule a follow-up appointment in: 3 months with Kristin/Paula for a device check.  Your physician wants you to follow-up in: 1 year with Dr. Klein. You will receive a reminder letter in the mail two months in advance. If you don't receive a letter, please call our office to schedule the follow-up appointment.  Your physician recommends that you continue on your current medications as directed. Please refer to the Current Medication list given to you today.  

## 2011-07-08 ENCOUNTER — Other Ambulatory Visit: Payer: Self-pay | Admitting: Cardiology

## 2011-07-09 NOTE — Telephone Encounter (Signed)
Refilled generic coreg 

## 2011-08-06 ENCOUNTER — Encounter: Payer: Self-pay | Admitting: Internal Medicine

## 2011-09-15 ENCOUNTER — Encounter: Payer: Self-pay | Admitting: Internal Medicine

## 2011-09-15 ENCOUNTER — Ambulatory Visit (INDEPENDENT_AMBULATORY_CARE_PROVIDER_SITE_OTHER): Payer: 59 | Admitting: *Deleted

## 2011-09-15 DIAGNOSIS — I5022 Chronic systolic (congestive) heart failure: Secondary | ICD-10-CM

## 2011-09-15 DIAGNOSIS — I428 Other cardiomyopathies: Secondary | ICD-10-CM

## 2011-09-15 LAB — ICD DEVICE OBSERVATION
AL AMPLITUDE: 5.75 mv
ATRIAL PACING ICD: 32.53 pct
CHARGE TIME: 9.629 s
LV LEAD IMPEDENCE ICD: 646 Ohm
LV LEAD THRESHOLD: 1 V
RV LEAD IMPEDENCE ICD: 703 Ohm
RV LEAD THRESHOLD: 0.5 V
TOT-0001: 1
TOT-0006: 20100826000000
TZAT-0001ATACH: 1
TZAT-0002ATACH: NEGATIVE
TZAT-0002ATACH: NEGATIVE
TZAT-0002FASTVT: NEGATIVE
TZAT-0005SLOWVT: 88 pct
TZAT-0005SLOWVT: 91 pct
TZAT-0011SLOWVT: 10 ms
TZAT-0011SLOWVT: 10 ms
TZAT-0012SLOWVT: 200 ms
TZAT-0012SLOWVT: 200 ms
TZAT-0013SLOWVT: 1
TZAT-0013SLOWVT: 2
TZAT-0018ATACH: NEGATIVE
TZAT-0018ATACH: NEGATIVE
TZAT-0018SLOWVT: NEGATIVE
TZAT-0018SLOWVT: NEGATIVE
TZAT-0019ATACH: 6 V
TZAT-0019ATACH: 6 V
TZAT-0019FASTVT: 8 V
TZAT-0020ATACH: 1.5 ms
TZAT-0020FASTVT: 1.5 ms
TZON-0003SLOWVT: 350 ms
TZON-0004SLOWVT: 48
TZON-0005SLOWVT: 12
TZST-0001ATACH: 5
TZST-0001FASTVT: 3
TZST-0001SLOWVT: 4
TZST-0001SLOWVT: 6
TZST-0002ATACH: NEGATIVE
TZST-0002FASTVT: NEGATIVE
TZST-0002FASTVT: NEGATIVE
TZST-0002FASTVT: NEGATIVE
TZST-0003SLOWVT: 35 J
VENTRICULAR PACING ICD: 100 pct
VF: 0

## 2011-09-15 NOTE — Progress Notes (Signed)
ICD check with ICM 

## 2011-10-27 ENCOUNTER — Other Ambulatory Visit: Payer: Self-pay | Admitting: Cardiology

## 2011-11-03 ENCOUNTER — Other Ambulatory Visit: Payer: Self-pay | Admitting: *Deleted

## 2011-11-21 ENCOUNTER — Ambulatory Visit (INDEPENDENT_AMBULATORY_CARE_PROVIDER_SITE_OTHER): Payer: 59 | Admitting: Cardiology

## 2011-11-21 ENCOUNTER — Encounter: Payer: Self-pay | Admitting: Cardiology

## 2011-11-21 VITALS — BP 132/78 | HR 72 | Ht 67.0 in | Wt 252.0 lb

## 2011-11-21 DIAGNOSIS — I420 Dilated cardiomyopathy: Secondary | ICD-10-CM

## 2011-11-21 DIAGNOSIS — I428 Other cardiomyopathies: Secondary | ICD-10-CM

## 2011-11-21 DIAGNOSIS — I5022 Chronic systolic (congestive) heart failure: Secondary | ICD-10-CM

## 2011-11-21 DIAGNOSIS — I509 Heart failure, unspecified: Secondary | ICD-10-CM

## 2011-11-21 MED ORDER — FUROSEMIDE 40 MG PO TABS: 40.0000 mg | ORAL_TABLET | ORAL | Status: DC | PRN

## 2011-11-21 NOTE — Assessment & Plan Note (Addendum)
He is on optimal medical therapy with ACE inhibitor, carvedilol, and Aldactone. He appears to be well compensated on his current dose of Lasix. Will follow up again in 6 months.

## 2011-11-21 NOTE — Patient Instructions (Signed)
Continue your current medication  Watch your diet carefully and try and keep your weight down  I will see you again in 6 months.

## 2011-11-21 NOTE — Progress Notes (Signed)
Jonathon Avila Date of Birth: 09-11-1955   History of Present Illness: Jonathon Avila is seen  today for followup. He states he is feeling well. He denies any chest pain or shortness of breath. He's had no increase in edema. He admits that he hasn't done as well with his diet result has some weight. Reports that Jonathon Avila checks his blood work regularly.  Current Outpatient Prescriptions on File Prior to Visit  Medication Sig Dispense Refill  . aspirin 325 MG EC tablet Take 325 mg by mouth daily.        . carvedilol (COREG) 25 MG tablet TAKE ONE TABLET BY MOUTH TWICE DAILY  60 tablet  5  . cholecalciferol (VITAMIN D) 1000 UNITS tablet Take 1,000 Units by mouth 2 (two) times daily.       . furosemide (LASIX) 40 MG tablet Take 40 mg by mouth as needed.      [name] lisinopril (PRINIVIL,ZESTRIL) 10 MG tablet Take 1 tablet (10 mg total) by mouth daily.  30 tablet  5  . simvastatin (ZOCOR) 40 MG tablet Take 40 mg by mouth at bedtime.        [name] spironolactone (ALDACTONE) 25 MG tablet TAKE ONE TABLET BY MOUTH AT BEDTIME  30 tablet  6    Allergies  Allergen Reactions  . Codeine   . Shellfish Allergy     Past Medical History  Diagnosis Date  . Hyperlipidemia   . LBBB (left bundle branch block)   . Nonischemic dilated cardiomyopathy   . Mitral insufficiency   . Pulmonary hypertension   . CHF (congestive heart failure)   . LV dysfunction     Past Surgical History  Procedure Date  . Cardiac catheterization 10/25/2001    EF 65%  . Cholecystectomy   . Icd     BIVENTRICULAR  . US echocardiography 02/19/2009    EF 45%    History  Smoking status  . Never Smoker   Smokeless tobacco  . Not on file    History  Alcohol Use No    History reviewed. No pertinent family history.  Review of Systems: The review of systems is positive for  weight gain without increase in edema.  All other systems were reviewed and are negative.  Physical Exam: BP 132/78  Pulse 72  Ht 5\' 7"  (1.702 m)  Wt 252  lb (114.306 kg)  BMI 39.47 kg/m2  is an overweight white male in no acute distress.The patient is alert and oriented x 3.  The mood and affect are normal.  The skin is warm and dry.  Color is normal.  The HEENT exam reveals that the sclera are nonicteric.  The mucous membranes are moist.  The carotids are 2+ without bruits.  There is no thyromegaly.  There is no JVD.  The lungs are clear.  The chest wall is non tender.  The heart exam reveals a regular rate with a normal S1 and S2.  There are no murmurs, gallops, or rubs.  The PMI is not displaced.   Abdominal exam reveals good bowel sounds.  There is no guarding or rebound.  There is no hepatosplenomegaly or tenderness.  There are no masses.  Exam of the legs reveal no clubbing, cyanosis, or edema.  The legs are without rashes.  The distal pulses are intact.  Cranial nerves II - XII are intact.  Motor and sensory functions are intact.  The gait is normal. LABORATORY DATA:  ECG today demonstrates sinus rhythm with  dual-chamber and biventricular pacing. Assessment / Plan:

## 2011-11-21 NOTE — Assessment & Plan Note (Signed)
His most recent echocardiogram in September showed a mild decline in his ejection fraction to 35-40%. He is clinically unchanged. He had questions today concerning left ventricular assist device is and we discussed this. He currently is doing too well to consider this option.

## 2011-12-17 ENCOUNTER — Encounter: Payer: Self-pay | Admitting: Internal Medicine

## 2011-12-17 ENCOUNTER — Ambulatory Visit (INDEPENDENT_AMBULATORY_CARE_PROVIDER_SITE_OTHER): Payer: 59 | Admitting: *Deleted

## 2011-12-17 DIAGNOSIS — Z95810 Presence of automatic (implantable) cardiac defibrillator: Secondary | ICD-10-CM

## 2011-12-17 DIAGNOSIS — I5022 Chronic systolic (congestive) heart failure: Secondary | ICD-10-CM

## 2011-12-17 LAB — ICD DEVICE OBSERVATION
ATRIAL PACING ICD: 25.42 pct
BAMS-0001: 170 {beats}/min
CHARGE TIME: 9.909 s
FVT: 0
PACEART VT: 0
RV LEAD AMPLITUDE: 13.5 mv
RV LEAD THRESHOLD: 0.5 V
TOT-0001: 1
TZAT-0001ATACH: 1
TZAT-0001ATACH: 2
TZAT-0001ATACH: 3
TZAT-0001SLOWVT: 1
TZAT-0001SLOWVT: 2
TZAT-0002ATACH: NEGATIVE
TZAT-0002ATACH: NEGATIVE
TZAT-0012ATACH: 150 ms
TZAT-0012FASTVT: 200 ms
TZAT-0013SLOWVT: 1
TZAT-0013SLOWVT: 2
TZAT-0018ATACH: NEGATIVE
TZAT-0018ATACH: NEGATIVE
TZAT-0018SLOWVT: NEGATIVE
TZAT-0018SLOWVT: NEGATIVE
TZAT-0019ATACH: 6 V
TZAT-0019FASTVT: 8 V
TZAT-0019SLOWVT: 8 V
TZAT-0019SLOWVT: 8 V
TZAT-0020FASTVT: 1.5 ms
TZAT-0020SLOWVT: 1.5 ms
TZAT-0020SLOWVT: 1.5 ms
TZON-0004SLOWVT: 48
TZON-0004VSLOWVT: 20
TZON-0005SLOWVT: 12
TZST-0001ATACH: 5
TZST-0001ATACH: 6
TZST-0001FASTVT: 2
TZST-0001SLOWVT: 3
TZST-0001SLOWVT: 5
TZST-0001SLOWVT: 6
TZST-0002ATACH: NEGATIVE
TZST-0002ATACH: NEGATIVE
TZST-0002ATACH: NEGATIVE
TZST-0002FASTVT: NEGATIVE
TZST-0003SLOWVT: 35 J

## 2011-12-17 NOTE — Progress Notes (Signed)
Pt seen in clinic for follow up of ICD.  No complaints of chest pain, shortness of breath, dizziness, palpitations, or shocks.  Device functioning normally at this time.  For full details, see PaceArt report.  No programming changes made today.  Plan to follow up in 6 months with Dr Graciela Husbands, will do [facility] transmission in 3 months.   Gypsy Balsam, RN, BSN 12/17/2011 2:05 PM

## 2012-01-19 ENCOUNTER — Other Ambulatory Visit: Payer: Self-pay | Admitting: Cardiology

## 2012-03-18 ENCOUNTER — Encounter: Payer: 59 | Admitting: *Deleted

## 2012-03-26 ENCOUNTER — Encounter: Payer: Self-pay | Admitting: *Deleted

## 2012-03-31 ENCOUNTER — Other Ambulatory Visit: Payer: Self-pay | Admitting: Cardiology

## 2012-04-06 ENCOUNTER — Telehealth: Payer: Self-pay | Admitting: Internal Medicine

## 2012-05-26 ENCOUNTER — Encounter: Payer: Self-pay | Admitting: Cardiology

## 2012-05-26 ENCOUNTER — Ambulatory Visit (INDEPENDENT_AMBULATORY_CARE_PROVIDER_SITE_OTHER): Payer: 59 | Admitting: Cardiology

## 2012-05-26 VITALS — BP 128/86 | HR 80 | Ht 67.5 in | Wt 254.0 lb

## 2012-05-26 DIAGNOSIS — I5022 Chronic systolic (congestive) heart failure: Secondary | ICD-10-CM

## 2012-05-26 DIAGNOSIS — I428 Other cardiomyopathies: Secondary | ICD-10-CM

## 2012-05-26 DIAGNOSIS — Z95810 Presence of automatic (implantable) cardiac defibrillator: Secondary | ICD-10-CM

## 2012-05-26 DIAGNOSIS — I447 Left bundle-branch block, unspecified: Secondary | ICD-10-CM

## 2012-05-26 NOTE — Patient Instructions (Signed)
Continue your current therapy.  I will see you again in 6 months.  We will get a copy of your lab work from Dr. Duanne Guess.

## 2012-05-26 NOTE — Progress Notes (Signed)
Jonathon Avila Date of Birth: [birth year]   History of Present Illness: Jonathon Avila is seen  today for followup. Jonathon Avila reports that Jonathon Avila is doing well. Jonathon Avila is under increased stress recently since his mother is terminally ill from chronic lung disease. Jonathon Avila states his shortness of breath is about the same and is still mild. Jonathon Avila denies any increase in edema. Jonathon Avila's had no chest pain or palpitations.  Current Outpatient Prescriptions on File Prior to Visit  Medication Sig Dispense Refill  . aspirin 325 MG EC tablet Take 325 mg by mouth daily.        . carvedilol (COREG) 25 MG tablet TAKE ONE TABLET BY MOUTH TWICE DAILY  60 tablet  5  . cholecalciferol (VITAMIN D) 1000 UNITS tablet Take 2,000 Units by mouth 2 (two) times daily.       . furosemide (LASIX) 40 MG tablet Take 1 tablet (40 mg total) by mouth as needed.  30 tablet  6  . lisinopril (PRINIVIL,ZESTRIL) 10 MG tablet TAKE ONE TABLET BY MOUTH DAILY.  30 tablet  5  . simvastatin (ZOCOR) 40 MG tablet Take 40 mg by mouth at bedtime.        [name] spironolactone (ALDACTONE) 25 MG tablet TAKE ONE TABLET BY MOUTH AT BEDTIME  30 tablet  6    Allergies  Allergen Reactions  . Codeine   . Shellfish Allergy     Past Medical History  Diagnosis Date  . Hyperlipidemia   . LBBB (left bundle branch block)   . Nonischemic dilated cardiomyopathy   . Mitral insufficiency   . Pulmonary hypertension   . CHF (congestive heart failure)   . LV dysfunction     Past Surgical History  Procedure Date  . Cardiac catheterization 10/25/2001    EF 65%  . Cholecystectomy   . Icd     BIVENTRICULAR  . US echocardiography 02/19/2009    EF 45%    History  Smoking status  . Never Smoker   Smokeless tobacco  . Not on file    History  Alcohol Use No    History reviewed. No pertinent family history.  Review of Systems: The review of systems is positive for weight gain of about 2 pounds.  All other systems were reviewed and are negative.  Physical Exam: BP  128/86  Pulse 80  Ht 5' 7.5" (1.715 m)  Wt 254 lb (115.214 kg)  BMI 39.19 kg/m2  is an overweight white male in no acute distress.The patient is alert and oriented x 3.  The mood and affect are normal.  The skin is warm and dry.  Color is normal.  The HEENT exam reveals that the sclera are nonicteric.  The mucous membranes are moist.  The carotids are 2+ without bruits.  There is no thyromegaly.  There is no JVD.  The lungs are clear.  The chest wall is non tender.  The heart exam reveals a regular rate with a normal S1 and S2.  There are no murmurs, gallops, or rubs.  The PMI is not displaced.   Abdominal exam reveals good bowel sounds.  There is no guarding or rebound.  There is no hepatosplenomegaly or tenderness.  There are no masses.  Exam of the legs reveal no clubbing, cyanosis, or edema.  The legs are without rashes.  The distal pulses are intact.  Cranial nerves II - XII are intact.  Motor and sensory functions are intact.  The gait is normal. LABORATORY DATA:  Assessment / Plan: 1. Chronic systolic congestive heart failure. Ejection fraction of 35%. Jonathon Avila appears to be well compensated. Jonathon Avila is on optimal medical therapy with an ACE inhibitor, carvedilol, and Aldactone. Jonathon Avila has a biventricular pacemaker in place. We'll continue with his current therapy and followup in 6 months. I requested a copy of his most recent lab work from his primary care.  2. Status post ICD/biventricular pacemaker. It appears Jonathon Avila missed his last ICD check in July. We will 4 to the pacemaker clinic and have them followup.  3. Obesity.  4. Left bundle branch block.

## 2012-06-09 ENCOUNTER — Other Ambulatory Visit: Payer: Self-pay | Admitting: Cardiology

## 2012-06-30 ENCOUNTER — Ambulatory Visit (INDEPENDENT_AMBULATORY_CARE_PROVIDER_SITE_OTHER): Payer: 59 | Admitting: Internal Medicine

## 2012-06-30 ENCOUNTER — Encounter: Payer: Self-pay | Admitting: Internal Medicine

## 2012-06-30 VITALS — BP 114/66 | HR 83 | Ht 67.5 in | Wt 254.2 lb

## 2012-06-30 DIAGNOSIS — I428 Other cardiomyopathies: Secondary | ICD-10-CM

## 2012-06-30 DIAGNOSIS — I5022 Chronic systolic (congestive) heart failure: Secondary | ICD-10-CM

## 2012-06-30 DIAGNOSIS — Z95810 Presence of automatic (implantable) cardiac defibrillator: Secondary | ICD-10-CM

## 2012-06-30 LAB — ICD DEVICE OBSERVATION
AL THRESHOLD: 0.75 V
ATRIAL PACING ICD: 20.7 pct
BATTERY VOLTAGE: 3.04 V
RV LEAD AMPLITUDE: 15.4 mv
RV LEAD IMPEDENCE ICD: 703 Ohm
TZAT-0001ATACH: 1
TZAT-0001ATACH: 2
TZAT-0001ATACH: 3
TZAT-0001FASTVT: 1
TZAT-0001SLOWVT: 1
TZAT-0001SLOWVT: 2
TZAT-0002ATACH: NEGATIVE
TZAT-0002FASTVT: NEGATIVE
TZAT-0004SLOWVT: 8
TZAT-0012ATACH: 150 ms
TZAT-0012ATACH: 150 ms
TZAT-0012SLOWVT: 200 ms
TZAT-0012SLOWVT: 200 ms
TZAT-0013SLOWVT: 1
TZAT-0013SLOWVT: 2
TZAT-0018ATACH: NEGATIVE
TZAT-0018ATACH: NEGATIVE
TZAT-0018FASTVT: NEGATIVE
TZAT-0018SLOWVT: NEGATIVE
TZAT-0018SLOWVT: NEGATIVE
TZAT-0019SLOWVT: 8 V
TZAT-0019SLOWVT: 8 V
TZAT-0020ATACH: 1.5 ms
TZAT-0020SLOWVT: 1.5 ms
TZAT-0020SLOWVT: 1.5 ms
TZON-0003ATACH: 350 ms
TZON-0003SLOWVT: 350 ms
TZON-0003VSLOWVT: 450 ms
TZON-0004SLOWVT: 48
TZON-0004VSLOWVT: 20
TZON-0005SLOWVT: 12
TZST-0001ATACH: 4
TZST-0001ATACH: 5
TZST-0001ATACH: 6
TZST-0001FASTVT: 4
TZST-0001FASTVT: 6
TZST-0001SLOWVT: 3
TZST-0001SLOWVT: 5
TZST-0002ATACH: NEGATIVE
TZST-0002ATACH: NEGATIVE
TZST-0002ATACH: NEGATIVE
TZST-0002FASTVT: NEGATIVE
TZST-0002FASTVT: NEGATIVE
TZST-0002FASTVT: NEGATIVE
TZST-0003SLOWVT: 35 J
TZST-0003SLOWVT: 35 J
VENTRICULAR PACING ICD: 100 pct

## 2012-06-30 NOTE — Progress Notes (Signed)
  HPI  EDIS HUISH is a 56 y.o. male  seen in followup for a CRT-D. implanted for congestive heart failure in the setting of nonischemic heart disease. He is doing pretty well. He remains class II.   The patient denies chest pain, shortness of breath, nocturnal dyspnea, orthopnea or peripheral edema.  There have been no palpitations, lightheadedness or syncope.   .  Past Medical History  Diagnosis Date  . Hyperlipidemia   . LBBB (left bundle branch block)   . Nonischemic dilated cardiomyopathy   . Mitral insufficiency   . Pulmonary hypertension   . CHF (congestive heart failure)   . LV dysfunction     Past Surgical History  Procedure Date  . Cardiac catheterization 10/25/2001    EF 65%  . Cholecystectomy   . Icd     BIVENTRICULAR  . US echocardiography 02/19/2009    EF 45%    Current Outpatient Prescriptions  Medication Sig Dispense Refill  . aspirin 325 MG EC tablet Take 325 mg by mouth daily.        . carvedilol (COREG) 25 MG tablet TAKE ONE TABLET BY MOUTH TWICE DAILY  60 tablet  5  . cholecalciferol (VITAMIN D) 1000 UNITS tablet Take 2,000 Units by mouth 2 (two) times daily.       . furosemide (LASIX) 40 MG tablet Take 1 tablet (40 mg total) by mouth as needed.  30 tablet  6  . lisinopril (PRINIVIL,ZESTRIL) 10 MG tablet TAKE ONE TABLET BY MOUTH DAILY.  30 tablet  5  . simvastatin (ZOCOR) 40 MG tablet Take 40 mg by mouth at bedtime.        [name] spironolactone (ALDACTONE) 25 MG tablet TAKE ONE TABLET BY MOUTH AT BEDTIME  30 tablet  5    Allergies  Allergen Reactions  . Codeine   . Shellfish Allergy     Review of Systems negative except from HPI and PMH  Physical Exam BP 114/66  Pulse 83  Ht 5' 7.5" (1.715 m)  Wt 254 lb 3.2 oz (115.304 kg)  BMI 39.23 kg/m2  Well developed and well nourished in no acute distress HENT normal E scleral and icterus clear Neck Supple JVP flat; carotids brisk and full Clear to ausculation Device pocket well healed;  without hematoma or erythema  Regular rate and rhythm, no murmurs gallops or rub Soft with active bowel sounds No clubbing cyanosis and edema Alert and oriented, grossly normal motor and sensory function Skin Warm and Dry   Assessment and  Plan

## 2012-06-30 NOTE — Assessment & Plan Note (Signed)
The patient's device was interrogated.  The information was reviewed. No changes were made in the programming.    

## 2012-06-30 NOTE — Assessment & Plan Note (Signed)
Continue current medications. 

## 2012-06-30 NOTE — Assessment & Plan Note (Signed)
Stable and euvolemic 

## 2012-07-21 ENCOUNTER — Other Ambulatory Visit: Payer: Self-pay

## 2012-07-21 MED ORDER — CARVEDILOL 25 MG PO TABS: 25.0000 mg | ORAL_TABLET | Freq: Two times a day (BID) | ORAL | Status: DC

## 2012-09-27 ENCOUNTER — Other Ambulatory Visit: Payer: Self-pay | Admitting: Cardiology

## 2012-10-04 ENCOUNTER — Encounter: Payer: 59 | Admitting: *Deleted

## 2012-10-07 ENCOUNTER — Encounter: Payer: Self-pay | Admitting: *Deleted

## 2012-10-08 ENCOUNTER — Ambulatory Visit (INDEPENDENT_AMBULATORY_CARE_PROVIDER_SITE_OTHER): Payer: 59 | Admitting: *Deleted

## 2012-10-08 ENCOUNTER — Encounter: Payer: Self-pay | Admitting: Internal Medicine

## 2012-10-08 DIAGNOSIS — I428 Other cardiomyopathies: Secondary | ICD-10-CM

## 2012-10-08 DIAGNOSIS — Z95810 Presence of automatic (implantable) cardiac defibrillator: Secondary | ICD-10-CM

## 2012-10-08 DIAGNOSIS — I5022 Chronic systolic (congestive) heart failure: Secondary | ICD-10-CM

## 2012-10-08 LAB — REMOTE ICD DEVICE
AL IMPEDENCE ICD: 494 Ohm
ATRIAL PACING ICD: 20.57 pct
BAMS-0001: 170 {beats}/min
BATTERY VOLTAGE: 3.0085 V
CHARGE TIME: 10.239 s
LV LEAD IMPEDENCE ICD: 684 Ohm
PACEART VT: 0
TOT-0001: 1
TOT-0002: 0
TZAT-0001ATACH: 3
TZAT-0001FASTVT: 1
TZAT-0002FASTVT: NEGATIVE
TZAT-0012ATACH: 150 ms
TZAT-0012ATACH: 150 ms
TZAT-0012ATACH: 150 ms
TZAT-0012FASTVT: 200 ms
TZAT-0012SLOWVT: 200 ms
TZAT-0012SLOWVT: 200 ms
TZAT-0013SLOWVT: 1
TZAT-0013SLOWVT: 2
TZAT-0018SLOWVT: NEGATIVE
TZAT-0019ATACH: 6 V
TZAT-0019SLOWVT: 8 V
TZAT-0019SLOWVT: 8 V
TZAT-0020ATACH: 1.5 ms
TZAT-0020ATACH: 1.5 ms
TZAT-0020SLOWVT: 1.5 ms
TZAT-0020SLOWVT: 1.5 ms
TZON-0003ATACH: 350 ms
TZON-0003SLOWVT: 350 ms
TZON-0003VSLOWVT: 450 ms
TZON-0004SLOWVT: 48
TZST-0001ATACH: 4
TZST-0001ATACH: 5
TZST-0001ATACH: 6
TZST-0001FASTVT: 2
TZST-0001FASTVT: 4
TZST-0001SLOWVT: 5
TZST-0002ATACH: NEGATIVE
TZST-0002ATACH: NEGATIVE
TZST-0002FASTVT: NEGATIVE
TZST-0002FASTVT: NEGATIVE
TZST-0002FASTVT: NEGATIVE
TZST-0003SLOWVT: 35 J
TZST-0003SLOWVT: 35 J
VENTRICULAR PACING ICD: 100 pct

## 2012-11-22 ENCOUNTER — Encounter: Payer: Self-pay | Admitting: Cardiology

## 2012-11-22 ENCOUNTER — Ambulatory Visit (INDEPENDENT_AMBULATORY_CARE_PROVIDER_SITE_OTHER): Payer: 59 | Admitting: Cardiology

## 2012-11-22 VITALS — BP 118/64 | HR 81 | Ht 67.5 in | Wt 251.0 lb

## 2012-11-22 DIAGNOSIS — E785 Hyperlipidemia, unspecified: Secondary | ICD-10-CM

## 2012-11-22 NOTE — Patient Instructions (Signed)
Continue your current therapy  I will see you in 6 months.   

## 2012-11-22 NOTE — Progress Notes (Signed)
Jonathon Avila Date of Birth: [birth year]   History of Present Illness: Jonathon Avila is seen  today for followup. He reports that he is doing well. He is watching his carbohydrate intake carefully and has lost 3 pounds. He is walking regularly. He denies any significant dyspnea or chest pain. He's had no increase in edema. No defibrillator discharges.  Current Outpatient Prescriptions on File Prior to Visit  Medication Sig Dispense Refill  . aspirin 325 MG EC tablet Take 325 mg by mouth daily.        . carvedilol (COREG) 25 MG tablet Take 1 tablet (25 mg total) by mouth 2 (two) times daily with a meal.  60 tablet  11  . cholecalciferol (VITAMIN D) 1000 UNITS tablet Take 2,000 Units by mouth 2 (two) times daily.       . furosemide (LASIX) 40 MG tablet Take 1 tablet (40 mg total) by mouth as needed.  30 tablet  6  . lisinopril (PRINIVIL,ZESTRIL) 10 MG tablet TAKE ONE TABLET BY MOUTH EVERY DAY  30 tablet  4  . simvastatin (ZOCOR) 40 MG tablet Take 40 mg by mouth at bedtime.        [name] spironolactone (ALDACTONE) 25 MG tablet TAKE ONE TABLET BY MOUTH AT BEDTIME  30 tablet  5   No current facility-administered medications on file prior to visit.    Allergies  Allergen Reactions  . Codeine   . Shellfish Allergy     Past Medical History  Diagnosis Date  . Hyperlipidemia   . LBBB (left bundle branch block)   . Nonischemic dilated cardiomyopathy   . Mitral insufficiency   . Pulmonary hypertension   . CHF (congestive heart failure)   . LV dysfunction     Past Surgical History  Procedure Laterality Date  . Cardiac catheterization  10/25/2001    EF 65%  . Cholecystectomy    . Icd      BIVENTRICULAR  . US echocardiography  02/19/2009    EF 45%    History  Smoking status  . Never Smoker   Smokeless tobacco  . Not on file    History  Alcohol Use No    History reviewed. No pertinent family history.  Review of Systems: As noted in history of present illness.  All other systems  were reviewed and are negative.  Physical Exam: BP 118/64  Pulse 81  Ht 5' 7.5" (1.715 m)  Wt 251 lb (113.853 kg)  BMI 38.71 kg/m2  SpO2 97%  is an overweight white male in no acute distress.The patient is alert and oriented x 3.  The mood and affect are normal.  The skin is warm and dry.    The HEENT exam reveals that the sclera are nonicteric.  The mucous membranes are moist.  The carotids are 2+ without bruits.  There is no thyromegaly.  There is no JVD.  The lungs are clear.    The heart exam reveals a regular rate with a normal S1 and S2.  There are no murmurs, gallops, or rubs.  The PMI is not displaced.  ICD is in place in the left upper chest. Abdominal exam reveals good bowel sounds.  There is no guarding or rebound.  There is no hepatosplenomegaly or tenderness.  There are no masses.  Exam of the legs reveal no clubbing, cyanosis, or edema.  The legs are without rashes.  The distal pulses are intact.  Cranial nerves II - XII are intact.  Motor and  sensory functions are intact.  The gait is normal. LABORATORY DATA:   Assessment / Plan: 1. Chronic systolic congestive heart failure. Ejection fraction of 35%. He appears to be well compensated. He is on optimal medical therapy with an ACE inhibitor, carvedilol, and Aldactone. He has a biventricular pacemaker in place. I've made no changes in his medications today and will follow up again in 6 months.  2. Status post ICD/biventricular pacemaker. ICD check in January was satisfactory.  3. Obesity. I am encouraged with his weight loss.  4. Left bundle branch block.

## 2012-12-31 ENCOUNTER — Telehealth: Payer: Self-pay | Admitting: Cardiology

## 2012-12-31 NOTE — Telephone Encounter (Signed)
New problem     Please advise, patient Trying to get a handicap sticker.

## 2012-12-31 NOTE — Telephone Encounter (Signed)
Returned call to patient no answer.Left message on personal voice mail Dr.Jordan signed form for a handicap parking sticker.Form left at 3rd floor front desk.

## 2013-01-03 ENCOUNTER — Other Ambulatory Visit: Payer: Self-pay | Admitting: Cardiology

## 2013-01-03 ENCOUNTER — Ambulatory Visit (INDEPENDENT_AMBULATORY_CARE_PROVIDER_SITE_OTHER): Payer: 59 | Admitting: *Deleted

## 2013-01-03 DIAGNOSIS — I5022 Chronic systolic (congestive) heart failure: Secondary | ICD-10-CM

## 2013-01-03 DIAGNOSIS — Z95810 Presence of automatic (implantable) cardiac defibrillator: Secondary | ICD-10-CM

## 2013-01-03 DIAGNOSIS — I428 Other cardiomyopathies: Secondary | ICD-10-CM

## 2013-01-03 NOTE — Telephone Encounter (Signed)
Patient Instructions    Continue your current therapy   I will see you in 6 months.             Chart Reviewed By    Charna Elizabeth, LPN  on 1/61/0960  3:32 PM        Previous Visit      Provider Department Encounter #    10/08/2012  8:00 AM Peter [place], MD [facility] [id number]

## 2013-01-04 ENCOUNTER — Encounter: Payer: Self-pay | Admitting: Internal Medicine

## 2013-01-04 ENCOUNTER — Other Ambulatory Visit: Payer: Self-pay

## 2013-01-11 LAB — REMOTE ICD DEVICE
AL AMPLITUDE: 4.6 mv
BATTERY VOLTAGE: 3.0085 V
CHARGE TIME: 10.55 s
FVT: 0
RV LEAD AMPLITUDE: 14 mv
RV LEAD IMPEDENCE ICD: 741 Ohm
TOT-0001: 1
TZAT-0001ATACH: 2
TZAT-0002ATACH: NEGATIVE
TZAT-0002ATACH: NEGATIVE
TZAT-0004SLOWVT: 8
TZAT-0005SLOWVT: 88 pct
TZAT-0005SLOWVT: 91 pct
TZAT-0011SLOWVT: 10 ms
TZAT-0011SLOWVT: 10 ms
TZAT-0012ATACH: 150 ms
TZAT-0012FASTVT: 200 ms
TZAT-0018ATACH: NEGATIVE
TZAT-0019ATACH: 6 V
TZAT-0019ATACH: 6 V
TZAT-0019FASTVT: 8 V
TZAT-0019SLOWVT: 8 V
TZAT-0020ATACH: 1.5 ms
TZAT-0020ATACH: 1.5 ms
TZAT-0020FASTVT: 1.5 ms
TZON-0004VSLOWVT: 20
TZST-0001FASTVT: 5
TZST-0001SLOWVT: 4
TZST-0002ATACH: NEGATIVE
TZST-0002FASTVT: NEGATIVE
TZST-0002FASTVT: NEGATIVE
TZST-0003SLOWVT: 25 J
TZST-0003SLOWVT: 35 J

## 2013-02-03 ENCOUNTER — Encounter: Payer: Self-pay | Admitting: *Deleted

## 2013-03-04 ENCOUNTER — Other Ambulatory Visit: Payer: Self-pay

## 2013-03-04 MED ORDER — LISINOPRIL 10 MG PO TABS: 10.0000 mg | ORAL_TABLET | Freq: Every day | ORAL | Status: DC

## 2013-03-04 NOTE — Telephone Encounter (Signed)
lisinopril (PRINIVIL,ZESTRIL) 10 MG tablet  TAKE ONE TABLET BY MOUTH EVERY DAY  30 tablet  4  Patient Instructions    Continue your current therapy  I will see you in 6 months.      Chart Reviewed By    Charna Elizabeth, LPN on 9/60/4540 3:32 PM       Provider Department Encounter #   10/08/2012 8:00 AM Peter [place], MD [facility] [id number]

## 2013-04-11 ENCOUNTER — Ambulatory Visit (INDEPENDENT_AMBULATORY_CARE_PROVIDER_SITE_OTHER): Payer: 59 | Admitting: *Deleted

## 2013-04-11 DIAGNOSIS — I428 Other cardiomyopathies: Secondary | ICD-10-CM

## 2013-04-11 DIAGNOSIS — Z95810 Presence of automatic (implantable) cardiac defibrillator: Secondary | ICD-10-CM

## 2013-04-11 DIAGNOSIS — I5022 Chronic systolic (congestive) heart failure: Secondary | ICD-10-CM

## 2013-04-15 LAB — REMOTE ICD DEVICE
AL AMPLITUDE: 5.6 mv
FVT: 0
LV LEAD IMPEDENCE ICD: 646 Ohm
RV LEAD AMPLITUDE: 15.5 mv
RV LEAD IMPEDENCE ICD: 741 Ohm
TOT-0001: 1
TOT-0006: 20100826000000
TZAT-0001ATACH: 1
TZAT-0001ATACH: 2
TZAT-0001SLOWVT: 1
TZAT-0001SLOWVT: 2
TZAT-0002ATACH: NEGATIVE
TZAT-0002ATACH: NEGATIVE
TZAT-0002FASTVT: NEGATIVE
TZAT-0004SLOWVT: 8
TZAT-0004SLOWVT: 8
TZAT-0005SLOWVT: 88 pct
TZAT-0005SLOWVT: 91 pct
TZAT-0011SLOWVT: 10 ms
TZAT-0011SLOWVT: 10 ms
TZAT-0018ATACH: NEGATIVE
TZAT-0018ATACH: NEGATIVE
TZAT-0018FASTVT: NEGATIVE
TZAT-0018SLOWVT: NEGATIVE
TZAT-0019ATACH: 6 V
TZAT-0019ATACH: 6 V
TZAT-0019FASTVT: 8 V
TZAT-0020ATACH: 1.5 ms
TZAT-0020FASTVT: 1.5 ms
TZON-0004VSLOWVT: 20
TZON-0005SLOWVT: 12
TZST-0001FASTVT: 3
TZST-0001FASTVT: 5
TZST-0001FASTVT: 6
TZST-0001SLOWVT: 3
TZST-0001SLOWVT: 4
TZST-0001SLOWVT: 6
TZST-0002ATACH: NEGATIVE
TZST-0002FASTVT: NEGATIVE
TZST-0002FASTVT: NEGATIVE
TZST-0003SLOWVT: 25 J
TZST-0003SLOWVT: 35 J
VF: 0

## 2013-05-10 ENCOUNTER — Encounter: Payer: Self-pay | Admitting: *Deleted

## 2013-06-09 ENCOUNTER — Encounter: Payer: Self-pay | Admitting: Cardiology

## 2013-06-09 ENCOUNTER — Ambulatory Visit (INDEPENDENT_AMBULATORY_CARE_PROVIDER_SITE_OTHER): Payer: 59 | Admitting: Cardiology

## 2013-06-09 VITALS — BP 120/72 | HR 70 | Ht 67.5 in | Wt 256.0 lb

## 2013-06-09 DIAGNOSIS — Z95810 Presence of automatic (implantable) cardiac defibrillator: Secondary | ICD-10-CM

## 2013-06-09 DIAGNOSIS — I5022 Chronic systolic (congestive) heart failure: Secondary | ICD-10-CM

## 2013-06-09 DIAGNOSIS — I447 Left bundle-branch block, unspecified: Secondary | ICD-10-CM

## 2013-06-09 DIAGNOSIS — I428 Other cardiomyopathies: Secondary | ICD-10-CM

## 2013-06-09 DIAGNOSIS — E785 Hyperlipidemia, unspecified: Secondary | ICD-10-CM

## 2013-06-09 MED ORDER — ASPIRIN EC 81 MG PO TBEC: 81.0000 mg | DELAYED_RELEASE_TABLET | Freq: Every day | ORAL | Status: AC

## 2013-06-09 NOTE — Progress Notes (Signed)
Jonathon Avila Date of Birth: [birth year]   History of Present Illness: Jonathon Avila is seen  today for followup. He has a history of nonischemic cardiomyopathy with congestive heart failure. He is status post biventricular ICD. This did result initially in a significant improvement in his LV function and symptoms. His last echocardiogram in June of 2010 showed an ejection fraction of 35-40%. He is doing well. He remains active. He has gained a little bit of weight but denies any symptoms of edema or increased dyspnea. He is under increased stress recently with his mother dying of end-stage COPD. He is interested in the reports of the new heart failure medication.  Current Outpatient Prescriptions on File Prior to Visit  Medication Sig Dispense Refill  . carvedilol (COREG) 25 MG tablet Take 1 tablet (25 mg total) by mouth 2 (two) times daily with a meal.  60 tablet  11  . cholecalciferol (VITAMIN D) 1000 UNITS tablet Take 2,000 Units by mouth 2 (two) times daily.       . furosemide (LASIX) 40 MG tablet Take 1 tablet (40 mg total) by mouth as needed.  30 tablet  6  . lisinopril (PRINIVIL,ZESTRIL) 10 MG tablet Take 1 tablet (10 mg total) by mouth daily.  30 tablet  4  . Loratadine (CLARITIN PO) Take by mouth.      . simvastatin (ZOCOR) 40 MG tablet Take 40 mg by mouth at bedtime.        [name] spironolactone (ALDACTONE) 25 MG tablet TAKE ONE TABLET BY MOUTH AT BEDTIME  30 tablet  11   No current facility-administered medications on file prior to visit.    Allergies  Allergen Reactions  . Codeine     Past Medical History  Diagnosis Date  . Hyperlipidemia   . LBBB (left bundle branch block)   . Nonischemic dilated cardiomyopathy   . Mitral insufficiency   . Pulmonary hypertension   . LV dysfunction   . Chronic systolic CHF (congestive heart failure)     Past Surgical History  Procedure Laterality Date  . Cardiac catheterization  10/25/2001    EF 65%  . Cholecystectomy    . Icd     BIVENTRICULAR  . US echocardiography  02/19/2009    EF 45%    History  Smoking status  . Never Smoker   Smokeless tobacco  . Not on file    History  Alcohol Use No    History reviewed. No pertinent family history.  Review of Systems: As noted in history of present illness.  All other systems were reviewed and are negative.  Physical Exam: BP 120/72  Pulse 70  Ht 5' 7.5" (1.715 m)  Wt 256 lb (116.121 kg)  BMI 39.48 kg/m2 He is an overweight white male in no acute distress.  HEENT exam is normal.  The carotids are 2+ without bruits.  There is no thyromegaly.  There is no JVD.  The lungs are clear.    The heart exam reveals a regular rate with a normal S1 and S2.  There are no murmurs, gallops, or rubs.  The PMI is not displaced.  ICD is in place in the left upper chest. Abdominal exam reveals good bowel sounds.   There is no hepatosplenomegaly.  There are no masses.  Exam of the legs reveal no clubbing, cyanosis, or edema.  The legs are without rashes.  The distal pulses are intact.  Cranial nerves II - XII are intact.  Motor and sensory functions  are intact.  The gait is normal.  LABORATORY DATA: This ECG demonstrates AV sequential pacing. Rate is 70 beats per minute. ICD evaluation in August showed no significant arrhythmias.  Assessment / Plan: 1. Chronic systolic congestive heart failure. Ejection fraction of 35-40%. He appears to be well compensated. He is on optimal medical therapy with an ACE inhibitor, carvedilol, and Aldactone. He has a biventricular pacemaker in place. I've made no changes in his medications today. We will update his echocardiogram at this time.  2. Status post ICD/biventricular pacemaker. Continue followup in EP clinic.  3. Obesity. Recommend weight loss.  4. Left bundle branch block.

## 2013-06-09 NOTE — Patient Instructions (Addendum)
Continue your current therapy  Get your lab work checked with your primary care.  We will schedule you for an echocardiogram  I will see you in 6 months.

## 2013-06-14 ENCOUNTER — Encounter: Payer: Self-pay | Admitting: Internal Medicine

## 2013-06-28 ENCOUNTER — Ambulatory Visit (HOSPITAL_COMMUNITY): Payer: 59 | Attending: Cardiology | Admitting: Radiology

## 2013-06-28 ENCOUNTER — Encounter: Payer: Self-pay | Admitting: Internal Medicine

## 2013-06-28 DIAGNOSIS — E785 Hyperlipidemia, unspecified: Secondary | ICD-10-CM | POA: Insufficient documentation

## 2013-06-28 DIAGNOSIS — I447 Left bundle-branch block, unspecified: Secondary | ICD-10-CM | POA: Diagnosis not present

## 2013-06-28 DIAGNOSIS — I428 Other cardiomyopathies: Secondary | ICD-10-CM | POA: Diagnosis not present

## 2013-06-28 DIAGNOSIS — I5022 Chronic systolic (congestive) heart failure: Secondary | ICD-10-CM | POA: Diagnosis present

## 2013-06-28 DIAGNOSIS — I509 Heart failure, unspecified: Secondary | ICD-10-CM | POA: Diagnosis not present

## 2013-06-28 NOTE — Progress Notes (Signed)
Echocardiogram performed.  

## 2013-06-30 ENCOUNTER — Encounter: Payer: Self-pay | Admitting: Internal Medicine

## 2013-06-30 ENCOUNTER — Ambulatory Visit (INDEPENDENT_AMBULATORY_CARE_PROVIDER_SITE_OTHER): Payer: 59 | Admitting: Internal Medicine

## 2013-06-30 VITALS — BP 94/62 | HR 81 | Ht 67.0 in | Wt 254.0 lb

## 2013-06-30 DIAGNOSIS — I428 Other cardiomyopathies: Secondary | ICD-10-CM

## 2013-06-30 DIAGNOSIS — Z95810 Presence of automatic (implantable) cardiac defibrillator: Secondary | ICD-10-CM

## 2013-06-30 DIAGNOSIS — I5022 Chronic systolic (congestive) heart failure: Secondary | ICD-10-CM

## 2013-06-30 NOTE — Assessment & Plan Note (Signed)
Stable

## 2013-06-30 NOTE — Progress Notes (Signed)
  HPI  Jonathon Avila is a 57 y.o. male  seen in followup for a CRT-D. implanted for congestive heart failure in the setting of nonischemic heart disease. He is doing pretty well. He remains class II.   The patient denies chest pain, shortness of breath, nocturnal dyspnea, orthopnea or peripheral edema.  There have been no palpitations, lightheadedness or syncope.   .  Past Medical History  Diagnosis Date  . Hyperlipidemia   . LBBB (left bundle branch block)   . Nonischemic dilated cardiomyopathy   . Mitral insufficiency   . Pulmonary hypertension   . LV dysfunction   . Chronic systolic CHF (congestive heart failure)     Past Surgical History  Procedure Laterality Date  . Cardiac catheterization  10/25/2001    EF 65%  . Cholecystectomy    . Icd      BIVENTRICULAR  . US echocardiography  02/19/2009    EF 45%    Current Outpatient Prescriptions  Medication Sig Dispense Refill  . aspirin EC 81 MG tablet Take 1 tablet (81 mg total) by mouth daily.  90 tablet  3  . carvedilol (COREG) 25 MG tablet Take 1 tablet (25 mg total) by mouth 2 (two) times daily with a meal.  60 tablet  11  . cholecalciferol (VITAMIN D) 1000 UNITS tablet Take 2,000 Units by mouth 2 (two) times daily.       . fluticasone (FLONASE) 50 MCG/ACT nasal spray Place 2 sprays into the nose daily.      . furosemide (LASIX) 40 MG tablet Take 1 tablet (40 mg total) by mouth as needed.  30 tablet  6  . lisinopril (PRINIVIL,ZESTRIL) 10 MG tablet Take 1 tablet (10 mg total) by mouth daily.  30 tablet  4  . Loratadine (CLARITIN PO) Take by mouth.      . simvastatin (ZOCOR) 40 MG tablet Take 40 mg by mouth at bedtime.        [name] spironolactone (ALDACTONE) 25 MG tablet TAKE ONE TABLET BY MOUTH AT BEDTIME  30 tablet  11   No current facility-administered medications for this visit.    Allergies  Allergen Reactions  . Codeine     Review of Systems negative except from HPI and PMH  Physical Exam There were no  vitals taken for this visit.  Well developed and well nourished in no acute distress HENT normal E scleral and icterus clear Neck Supple JVP flat; carotids brisk and full Clear to ausculation Device pocket well healed; without hematoma or erythema  Regular rate and rhythm, no murmurs gallops or rub Soft with active bowel sounds No clubbing cyanosis and edema Alert and oriented, grossly normal motor and sensory function Skin Warm and Dry   Assessment and  Plan

## 2013-06-30 NOTE — Assessment & Plan Note (Signed)
The patient's device was interrogated.  The information was reviewed. No changes were made in the programming.    

## 2013-06-30 NOTE — Assessment & Plan Note (Signed)
Interval improvement is stable 45%

## 2013-06-30 NOTE — Patient Instructions (Addendum)
Remote monitoring is used to monitor your Pacemaker of ICD from home. This monitoring reduces the number of office visits required to check your device to one time per year. It allows [place] to keep an eye on the functioning of your device to ensure it is working properly. You are scheduled for a device check from home on 10/03/2013. You may send your transmission at any time that day. If you have a wireless device, the transmission will be sent automatically. After your physician reviews your transmission, you will receive a postcard with your next transmission date.   Your physician wants you to follow-up in: one year with Rick Duff, PAC.  You will receive a reminder letter in the mail two months in advance. If you don't receive a letter, please call our office to schedule the follow-up appointment.

## 2013-07-01 ENCOUNTER — Telehealth: Payer: Self-pay | Admitting: Internal Medicine

## 2013-07-01 LAB — ICD DEVICE OBSERVATION
AL AMPLITUDE: 7.8 mv
AL IMPEDENCE ICD: 494 Ohm
ATRIAL PACING ICD: 21.34 pct
CHARGE TIME: 10.71 s
FVT: 0
LV LEAD IMPEDENCE ICD: 646 Ohm
LV LEAD THRESHOLD: 0.875 V
RV LEAD AMPLITUDE: 16.6 mv
RV LEAD IMPEDENCE ICD: 703 Ohm
RV LEAD THRESHOLD: 0.5 V
TOT-0001: 1
TOT-0006: 20100826000000
TZAT-0001ATACH: 1
TZAT-0001ATACH: 2
TZAT-0001FASTVT: 1
TZAT-0002ATACH: NEGATIVE
TZAT-0002ATACH: NEGATIVE
TZAT-0005SLOWVT: 88 pct
TZAT-0005SLOWVT: 91 pct
TZAT-0011SLOWVT: 10 ms
TZAT-0011SLOWVT: 10 ms
TZAT-0012ATACH: 150 ms
TZAT-0012SLOWVT: 200 ms
TZAT-0012SLOWVT: 200 ms
TZAT-0013SLOWVT: 1
TZAT-0013SLOWVT: 2
TZAT-0018ATACH: NEGATIVE
TZAT-0018ATACH: NEGATIVE
TZAT-0018FASTVT: NEGATIVE
TZAT-0018SLOWVT: NEGATIVE
TZAT-0018SLOWVT: NEGATIVE
TZAT-0019ATACH: 6 V
TZAT-0019ATACH: 6 V
TZAT-0019ATACH: 6 V
TZAT-0019SLOWVT: 8 V
TZAT-0019SLOWVT: 8 V
TZAT-0020ATACH: 1.5 ms
TZAT-0020ATACH: 1.5 ms
TZAT-0020FASTVT: 1.5 ms
TZAT-0020SLOWVT: 1.5 ms
TZON-0003ATACH: 350 ms
TZON-0003SLOWVT: 350 ms
TZON-0004SLOWVT: 48
TZON-0005SLOWVT: 12
TZST-0001ATACH: 4
TZST-0001ATACH: 5
TZST-0001FASTVT: 3
TZST-0001FASTVT: 5
TZST-0001FASTVT: 6
TZST-0001SLOWVT: 4
TZST-0001SLOWVT: 6
TZST-0002ATACH: NEGATIVE
TZST-0002ATACH: NEGATIVE
TZST-0002ATACH: NEGATIVE
TZST-0002FASTVT: NEGATIVE
TZST-0002FASTVT: NEGATIVE
TZST-0002FASTVT: NEGATIVE
TZST-0002FASTVT: NEGATIVE
TZST-0002FASTVT: NEGATIVE
TZST-0003SLOWVT: 35 J
TZST-0003SLOWVT: 35 J
TZST-0003SLOWVT: 35 J
VENTRICULAR PACING ICD: 100 pct

## 2013-07-01 NOTE — Telephone Encounter (Signed)
Returned call to patient he stated PCP Dr.Powers ordered lab work 06/28/13.Stated he was looking at lab on mychart and saw potassium 5.1.Advised to avoid foods high in potassium.Advised to call PCP and have lab faxed to Dr.Klein.

## 2013-07-01 NOTE — Telephone Encounter (Signed)
Follow up    Pt called in his Potasium  5.1   Pt is aware it should be below 5. Pt would like a call back about what he should do to get down?   Thanks!

## 2013-07-19 ENCOUNTER — Other Ambulatory Visit: Payer: Self-pay

## 2013-07-19 MED ORDER — CARVEDILOL 25 MG PO TABS: 25.0000 mg | ORAL_TABLET | Freq: Two times a day (BID) | ORAL | Status: DC

## 2013-08-08 ENCOUNTER — Other Ambulatory Visit: Payer: Self-pay | Admitting: Cardiology

## 2013-10-03 ENCOUNTER — Ambulatory Visit (INDEPENDENT_AMBULATORY_CARE_PROVIDER_SITE_OTHER): Payer: 59 | Admitting: *Deleted

## 2013-10-03 DIAGNOSIS — I428 Other cardiomyopathies: Secondary | ICD-10-CM

## 2013-10-03 DIAGNOSIS — I5022 Chronic systolic (congestive) heart failure: Secondary | ICD-10-CM

## 2013-10-03 LAB — MDC_IDC_ENUM_SESS_TYPE_REMOTE
Battery Voltage: 2.95 V
Brady Statistic AS VP Percent: 82.09 %
Brady Statistic AS VS Percent: 0 %
HighPow Impedance: 54 Ohm
HighPow Impedance: 83 Ohm
Lead Channel Impedance Value: 323 Ohm
Lead Channel Impedance Value: 494 Ohm
Lead Channel Impedance Value: 684 Ohm
Lead Channel Pacing Threshold Amplitude: 0.875 V
Lead Channel Sensing Intrinsic Amplitude: 5 mV
Lead Channel Setting Pacing Amplitude: 2 V
Lead Channel Setting Pacing Amplitude: 2 V
Lead Channel Setting Pacing Amplitude: 2.5 V
Lead Channel Setting Pacing Pulse Width: 0.4 ms
Lead Channel Setting Pacing Pulse Width: 0.4 ms
Lead Channel Setting Sensing Sensitivity: 0.45 mV
MDC IDC MSMT LEADCHNL LV IMPEDANCE VALUE: 836 Ohm
MDC IDC MSMT LEADCHNL LV PACING THRESHOLD PULSEWIDTH: 0.4 ms
MDC IDC MSMT LEADCHNL RV IMPEDANCE VALUE: 741 Ohm
MDC IDC MSMT LEADCHNL RV SENSING INTR AMPL: 12.5 mV
MDC IDC SESS DTM: 20150126105655
MDC IDC SET ZONE DETECTION INTERVAL: 300 ms
MDC IDC SET ZONE DETECTION INTERVAL: 350 ms
MDC IDC SET ZONE DETECTION INTERVAL: 350 ms
MDC IDC STAT BRADY AP VP PERCENT: 17.91 %
MDC IDC STAT BRADY AP VS PERCENT: 0 %
MDC IDC STAT BRADY RA PERCENT PACED: 17.91 %
MDC IDC STAT BRADY RV PERCENT PACED: 100 %
Zone Setting Detection Interval: 450 ms

## 2013-10-18 ENCOUNTER — Encounter: Payer: Self-pay | Admitting: *Deleted

## 2013-10-27 ENCOUNTER — Encounter: Payer: Self-pay | Admitting: Internal Medicine

## 2013-12-07 ENCOUNTER — Other Ambulatory Visit: Payer: Self-pay | Admitting: Cardiology

## 2013-12-26 ENCOUNTER — Ambulatory Visit (INDEPENDENT_AMBULATORY_CARE_PROVIDER_SITE_OTHER): Payer: 59 | Admitting: Cardiology

## 2013-12-26 ENCOUNTER — Encounter: Payer: Self-pay | Admitting: Cardiology

## 2013-12-26 VITALS — BP 113/68 | HR 78 | Ht 67.0 in | Wt 244.0 lb

## 2013-12-26 DIAGNOSIS — E785 Hyperlipidemia, unspecified: Secondary | ICD-10-CM

## 2013-12-26 DIAGNOSIS — I5022 Chronic systolic (congestive) heart failure: Secondary | ICD-10-CM

## 2013-12-26 DIAGNOSIS — I447 Left bundle-branch block, unspecified: Secondary | ICD-10-CM

## 2013-12-26 DIAGNOSIS — I428 Other cardiomyopathies: Secondary | ICD-10-CM

## 2013-12-26 NOTE — Progress Notes (Signed)
Jonathon Avila Date of Birth: September 06, 1956   History of Present Illness: Jonathon Avila is seen  today for followup. He has a history of nonischemic cardiomyopathy with congestive heart failure. He is status post biventricular ICD. This did result in significant improvement in LV function. Last EF 45-50% in October.  He is doing well. He remains active. He has made significant change in his diet and has lost 12 lbs mainly by counting carbs. Plans to lose down to 200 lbs. His mother passed with end stage COPD.  Current Outpatient Prescriptions on File Prior to Visit  Medication Sig Dispense Refill  . aspirin EC 81 MG tablet Take 1 tablet (81 mg total) by mouth daily.  90 tablet  3  . carvedilol (COREG) 25 MG tablet Take 1 tablet (25 mg total) by mouth 2 (two) times daily with a meal.  60 tablet  6  . cholecalciferol (VITAMIN D) 1000 UNITS tablet Take 2,000 Units by mouth 2 (two) times daily.       . fluticasone (FLONASE) 50 MCG/ACT nasal spray Place 2 sprays into the nose daily.      . furosemide (LASIX) 40 MG tablet Take 1 tablet (40 mg total) by mouth as needed.  30 tablet  6  . lisinopril (PRINIVIL,ZESTRIL) 10 MG tablet TAKE ONE TABLET BY MOUTH ONCE DAILY  30 tablet  0  . Loratadine (CLARITIN PO) Take 1 tablet by mouth as needed.       . simvastatin (ZOCOR) 40 MG tablet Take 40 mg by mouth at bedtime.        [name] spironolactone (ALDACTONE) 25 MG tablet TAKE ONE TABLET BY MOUTH AT BEDTIME  30 tablet  11   No current facility-administered medications on file prior to visit.    Allergies  Allergen Reactions  . Codeine     Past Medical History  Diagnosis Date  . Hyperlipidemia   . LBBB (left bundle branch block)   . Nonischemic dilated cardiomyopathy   . Mitral insufficiency   . Pulmonary hypertension   . LV dysfunction   . Chronic systolic CHF (congestive heart failure)   . 6644 lead 06/10/2011    6949 rate sense portion was replaced with a Medtronic 5076-lead 2010     Past Surgical  History  Procedure Laterality Date  . Cardiac catheterization  10/25/2001    EF 65%  . Cholecystectomy    . Icd      BIVENTRICULAR  . US echocardiography  02/19/2009    EF 45%    History  Smoking status  . Never Smoker   Smokeless tobacco  . Not on file    History  Alcohol Use No    History reviewed. No pertinent family history.  Review of Systems: As noted in history of present illness.  All other systems were reviewed and are negative.  Physical Exam: BP 113/68  Pulse 78  Ht 5\' 7"  (1.702 m)  Wt 244 lb (110.678 kg)  BMI 38.21 kg/m2 He is an overweight white male in no acute distress.  HEENT exam is normal.  The carotids are 2+ without bruits.  There is no thyromegaly.  There is no JVD.  The lungs are clear.    The heart exam reveals a regular rate with a normal S1 and S2.  There are no murmurs, gallops, or rubs.  The PMI is not displaced.  ICD is in place in the left upper chest. Abdominal exam reveals good bowel sounds.   There is no  hepatosplenomegaly.  There are no masses.  Exam of the legs reveal no clubbing, cyanosis, or edema.  The legs are without rashes.  The distal pulses are intact.  Cranial nerves II - XII are intact.  Motor and sensory functions are intact.  The gait is normal.  LABORATORY DATA: ICD check in Jan. 2015 showed 100% biv pacing.  Echo:Study Conclusions  - Left ventricle: The cavity size was mildly dilated. Wall thickness was normal. Systolic function was mildly reduced. The estimated ejection fraction was in the range of 45% to 50%. Diffuse hypokinesis. Doppler parameters are consistent with abnormal left ventricular relaxation (grade 1 diastolic dysfunction). - Left atrium: The atrium was mildly dilated. - Right ventricle: The cavity size was mildly dilated. - Right atrium: The atrium was mildly dilated.     Assessment / Plan: 1. Chronic systolic congestive heart failure. Ejection fraction of 45-50%. He appears to be well compensated. He  is on optimal medical therapy with an ACE inhibitor, carvedilol, and Aldactone. He has a biventricular pacemaker in place. I've made no changes in his medications today. Follow up in 6 months.  2. Status post ICD/biventricular pacemaker. Continue followup in EP clinic.  3. Obesity. Encouraged continued weight loss.  4. Left bundle branch block.

## 2013-12-26 NOTE — Patient Instructions (Signed)
Continue your current therapy  Good work on your diet and weight loss.  I will see you in 6 months

## 2014-01-04 ENCOUNTER — Ambulatory Visit (INDEPENDENT_AMBULATORY_CARE_PROVIDER_SITE_OTHER): Payer: 59 | Admitting: *Deleted

## 2014-01-04 DIAGNOSIS — I5023 Acute on chronic systolic (congestive) heart failure: Secondary | ICD-10-CM

## 2014-01-04 DIAGNOSIS — I428 Other cardiomyopathies: Secondary | ICD-10-CM

## 2014-01-04 LAB — MDC_IDC_ENUM_SESS_TYPE_REMOTE
Brady Statistic AP VP Percent: 36.04 %
Brady Statistic AS VP Percent: 63.96 %
Brady Statistic AS VS Percent: 0 %
Brady Statistic RA Percent Paced: 36.04 %
Brady Statistic RV Percent Paced: 100 %
HighPow Impedance: 54 Ohm
HighPow Impedance: 79 Ohm
Lead Channel Impedance Value: 323 Ohm
Lead Channel Impedance Value: 475 Ohm
Lead Channel Impedance Value: 741 Ohm
Lead Channel Impedance Value: 798 Ohm
Lead Channel Pacing Threshold Pulse Width: 0.4 ms
Lead Channel Sensing Intrinsic Amplitude: 12.5 mV
Lead Channel Setting Pacing Amplitude: 2 V
Lead Channel Setting Pacing Amplitude: 2 V
Lead Channel Setting Pacing Pulse Width: 0.4 ms
Lead Channel Setting Sensing Sensitivity: 0.45 mV
MDC IDC MSMT BATTERY VOLTAGE: 2.91 V
MDC IDC MSMT LEADCHNL LV IMPEDANCE VALUE: 684 Ohm
MDC IDC MSMT LEADCHNL LV PACING THRESHOLD AMPLITUDE: 0.875 V
MDC IDC MSMT LEADCHNL RA SENSING INTR AMPL: 3.875 mV
MDC IDC SESS DTM: 20150429113728
MDC IDC SET LEADCHNL LV PACING PULSEWIDTH: 0.4 ms
MDC IDC SET LEADCHNL RV PACING AMPLITUDE: 2.5 V
MDC IDC SET ZONE DETECTION INTERVAL: 300 ms
MDC IDC SET ZONE DETECTION INTERVAL: 450 ms
MDC IDC STAT BRADY AP VS PERCENT: 0 %
Zone Setting Detection Interval: 350 ms
Zone Setting Detection Interval: 350 ms

## 2014-01-06 ENCOUNTER — Other Ambulatory Visit: Payer: Self-pay | Admitting: Cardiology

## 2014-01-20 ENCOUNTER — Encounter: Payer: Self-pay | Admitting: Cardiology

## 2014-01-20 NOTE — Progress Notes (Signed)
Remote ICD transmission.   

## 2014-01-24 ENCOUNTER — Encounter: Payer: Self-pay | Admitting: Internal Medicine

## 2014-01-30 ENCOUNTER — Other Ambulatory Visit: Payer: Self-pay | Admitting: Cardiology

## 2014-02-15 ENCOUNTER — Other Ambulatory Visit: Payer: Self-pay | Admitting: Cardiology

## 2014-03-23 NOTE — Telephone Encounter (Signed)
Close encounter 

## 2014-04-10 ENCOUNTER — Ambulatory Visit (INDEPENDENT_AMBULATORY_CARE_PROVIDER_SITE_OTHER): Payer: 59 | Admitting: *Deleted

## 2014-04-10 DIAGNOSIS — I5022 Chronic systolic (congestive) heart failure: Secondary | ICD-10-CM

## 2014-04-10 DIAGNOSIS — I428 Other cardiomyopathies: Secondary | ICD-10-CM

## 2014-04-11 ENCOUNTER — Telehealth: Payer: Self-pay | Admitting: Cardiology

## 2014-04-11 DIAGNOSIS — I428 Other cardiomyopathies: Secondary | ICD-10-CM

## 2014-04-11 DIAGNOSIS — I5022 Chronic systolic (congestive) heart failure: Secondary | ICD-10-CM

## 2014-04-11 NOTE — Telephone Encounter (Signed)
Spoke with pt and reminded pt of remote transmission that is due today. Pt verbalized understanding.   

## 2014-04-11 NOTE — Progress Notes (Signed)
Remote ICD transmission.   

## 2014-04-12 LAB — MDC_IDC_ENUM_SESS_TYPE_REMOTE
Battery Voltage: 2.85 V
Brady Statistic AP VS Percent: 0 %
Brady Statistic AS VS Percent: 0 %
Brady Statistic RA Percent Paced: 39.79 %
Date Time Interrogation Session: 20150804182739
HIGH POWER IMPEDANCE MEASURED VALUE: 55 Ohm
HIGH POWER IMPEDANCE MEASURED VALUE: 84 Ohm
Lead Channel Impedance Value: 475 Ohm
Lead Channel Impedance Value: 741 Ohm
Lead Channel Impedance Value: 855 Ohm
Lead Channel Pacing Threshold Amplitude: 0.875 V
Lead Channel Pacing Threshold Pulse Width: 0.4 ms
Lead Channel Sensing Intrinsic Amplitude: 12.5 mV
Lead Channel Sensing Intrinsic Amplitude: 3.75 mV
Lead Channel Sensing Intrinsic Amplitude: 3.75 mV
Lead Channel Setting Pacing Amplitude: 2 V
Lead Channel Setting Pacing Amplitude: 2 V
Lead Channel Setting Pacing Amplitude: 2.5 V
Lead Channel Setting Pacing Pulse Width: 0.4 ms
Lead Channel Setting Sensing Sensitivity: 0.45 mV
MDC IDC MSMT LEADCHNL LV IMPEDANCE VALUE: 323 Ohm
MDC IDC MSMT LEADCHNL LV IMPEDANCE VALUE: 684 Ohm
MDC IDC MSMT LEADCHNL RV SENSING INTR AMPL: 12.5 mV
MDC IDC SET LEADCHNL RV PACING PULSEWIDTH: 0.4 ms
MDC IDC SET ZONE DETECTION INTERVAL: 350 ms
MDC IDC SET ZONE DETECTION INTERVAL: 450 ms
MDC IDC STAT BRADY AP VP PERCENT: 39.79 %
MDC IDC STAT BRADY AS VP PERCENT: 60.21 %
MDC IDC STAT BRADY RV PERCENT PACED: 100 %
Zone Setting Detection Interval: 300 ms
Zone Setting Detection Interval: 350 ms

## 2014-05-02 ENCOUNTER — Encounter: Payer: Self-pay | Admitting: Cardiology

## 2014-05-11 ENCOUNTER — Encounter: Payer: Self-pay | Admitting: Internal Medicine

## 2014-06-16 ENCOUNTER — Encounter: Payer: Self-pay | Admitting: Cardiology

## 2014-06-21 ENCOUNTER — Other Ambulatory Visit: Payer: Self-pay

## 2014-06-21 MED ORDER — CARVEDILOL 25 MG PO TABS: ORAL_TABLET | ORAL | Status: DC

## 2014-06-30 ENCOUNTER — Encounter: Payer: Self-pay | Admitting: Internal Medicine

## 2014-06-30 ENCOUNTER — Ambulatory Visit (INDEPENDENT_AMBULATORY_CARE_PROVIDER_SITE_OTHER): Payer: 59 | Admitting: Internal Medicine

## 2014-06-30 VITALS — BP 102/56 | HR 84 | Ht 67.0 in | Wt 238.4 lb

## 2014-06-30 DIAGNOSIS — Z4502 Encounter for adjustment and management of automatic implantable cardiac defibrillator: Secondary | ICD-10-CM

## 2014-06-30 DIAGNOSIS — I428 Other cardiomyopathies: Secondary | ICD-10-CM

## 2014-06-30 DIAGNOSIS — I5022 Chronic systolic (congestive) heart failure: Secondary | ICD-10-CM

## 2014-06-30 DIAGNOSIS — I429 Cardiomyopathy, unspecified: Secondary | ICD-10-CM

## 2014-06-30 LAB — MDC_IDC_ENUM_SESS_TYPE_INCLINIC
Battery Voltage: 2.75 V
Brady Statistic AP VP Percent: 32.18 %
Brady Statistic AS VS Percent: 0 %
Brady Statistic RA Percent Paced: 32.18 %
Brady Statistic RV Percent Paced: 100 %
Date Time Interrogation Session: 20151023142327
HighPow Impedance: 54 Ohm
HighPow Impedance: 82 Ohm
Lead Channel Impedance Value: 475 Ohm
Lead Channel Impedance Value: 836 Ohm
Lead Channel Pacing Threshold Amplitude: 0.5 V
Lead Channel Pacing Threshold Amplitude: 0.75 V
Lead Channel Pacing Threshold Pulse Width: 0.4 ms
Lead Channel Sensing Intrinsic Amplitude: 6 mV
Lead Channel Setting Pacing Pulse Width: 0.4 ms
Lead Channel Setting Sensing Sensitivity: 0.45 mV
MDC IDC MSMT LEADCHNL LV IMPEDANCE VALUE: 323 Ohm
MDC IDC MSMT LEADCHNL LV IMPEDANCE VALUE: 646 Ohm
MDC IDC MSMT LEADCHNL LV PACING THRESHOLD AMPLITUDE: 0.875 V
MDC IDC MSMT LEADCHNL LV PACING THRESHOLD PULSEWIDTH: 0.4 ms
MDC IDC MSMT LEADCHNL LV PACING THRESHOLD PULSEWIDTH: 0.4 ms
MDC IDC MSMT LEADCHNL RA PACING THRESHOLD AMPLITUDE: 0.75 V
MDC IDC MSMT LEADCHNL RA SENSING INTR AMPL: 4.375 mV
MDC IDC MSMT LEADCHNL RV IMPEDANCE VALUE: 741 Ohm
MDC IDC MSMT LEADCHNL RV PACING THRESHOLD PULSEWIDTH: 0.4 ms
MDC IDC MSMT LEADCHNL RV SENSING INTR AMPL: 12.5 mV
MDC IDC MSMT LEADCHNL RV SENSING INTR AMPL: 13.125 mV
MDC IDC SET LEADCHNL LV PACING AMPLITUDE: 2 V
MDC IDC SET LEADCHNL RA PACING AMPLITUDE: 2 V
MDC IDC SET LEADCHNL RV PACING AMPLITUDE: 2.5 V
MDC IDC SET LEADCHNL RV PACING PULSEWIDTH: 0.4 ms
MDC IDC SET ZONE DETECTION INTERVAL: 350 ms
MDC IDC SET ZONE DETECTION INTERVAL: 450 ms
MDC IDC STAT BRADY AP VS PERCENT: 0 %
MDC IDC STAT BRADY AS VP PERCENT: 67.82 %
Zone Setting Detection Interval: 300 ms
Zone Setting Detection Interval: 350 ms

## 2014-06-30 NOTE — Progress Notes (Signed)
  HPI  Jonathon Avila is a 58 y.o. male  seen in followup for a CRT-D. implanted for congestive heart failure in the setting of nonischemic heart disease. He is doing pretty well. He remains class II.   The patient denies chest pain, shortness of breath, nocturnal dyspnea, orthopnea or peripheral edema.  There have been no palpitations, lightheadedness or syncope.   .  Past Medical History  Diagnosis Date  . Hyperlipidemia   . LBBB (left bundle branch block)   . Nonischemic dilated cardiomyopathy   . Mitral insufficiency   . Pulmonary hypertension   . LV dysfunction   . Chronic systolic CHF (congestive heart failure)   . 2119 lead 06/10/2011    6949 rate sense portion was replaced with a Medtronic 5076-lead 2010     Past Surgical History  Procedure Laterality Date  . Cardiac catheterization  10/25/2001    EF 65%  . Cholecystectomy    . Icd      BIVENTRICULAR  . US echocardiography  02/19/2009    EF 45%    Current Outpatient Prescriptions  Medication Sig Dispense Refill  . aspirin EC 81 MG tablet Take 1 tablet (81 mg total) by mouth daily.  90 tablet  3  . carvedilol (COREG) 25 MG tablet TAKE ONE TABLET BY MOUTH TWICE DAILY WITH MEALS  60 tablet  6  . cholecalciferol (VITAMIN D) 1000 UNITS tablet Take 2,000 Units by mouth 2 (two) times daily.       . fluticasone (FLONASE) 50 MCG/ACT nasal spray Place 2 sprays into the nose daily.      . furosemide (LASIX) 40 MG tablet Take 1 tablet (40 mg total) by mouth as needed.  30 tablet  6  . lisinopril (PRINIVIL,ZESTRIL) 5 MG tablet Take 5 mg by mouth daily.      . Loratadine (CLARITIN PO) Take 1 tablet by mouth as needed.       . simvastatin (ZOCOR) 20 MG tablet Take 20 mg by mouth daily.      [name] spironolactone (ALDACTONE) 25 MG tablet TAKE ONE TABLET BY MOUTH AT BEDTIME  30 tablet  5   No current facility-administered medications for this visit.    Allergies  Allergen Reactions  . Codeine     Upset stomach   .  Shellfish-Derived Products Nausea And Vomiting    Review of Systems negative except from HPI and PMH  Physical Exam BP 102/56  Pulse 84  Ht 5\' 7"  (1.702 m)  Wt 238 lb 6.4 oz (108.138 kg)  BMI 37.33 kg/m2  Well developed and well nourished in no acute distress HENT normal E scleral and icterus clear Neck Supple JVP flat; carotids brisk and full Clear to ausculation Device pocket well healed; without hematoma or erythema  Regular rate and rhythm, no murmurs gallops or rub Soft with active bowel sounds No clubbing cyanosis and edema Alert and oriented, grossly normal motor and sensory function Skin Warm and Dry   Assessment and  Plan  Nonischemic cardiomyopathy  Systolic heart failure chronic 2 Euvolemic continue current meds  Implantable defibrillator-CRT-Medtronic The patient's device was interrogated.  The information was reviewed. No changes were made in the programming.     He is doing very well. We will continue him on his current medications. He is euvolemic.  a

## 2014-06-30 NOTE — Patient Instructions (Signed)
Send a carelink transmission on 10/02/14  Your physician wants you to follow-up in: 1 year with Dr. Caryl Comes.  You will receive a reminder letter in the mail two months in advance. If you don't receive a letter, please call our office to schedule the follow-up appointment.

## 2014-07-07 ENCOUNTER — Ambulatory Visit: Payer: 59 | Admitting: Cardiology

## 2014-08-31 ENCOUNTER — Other Ambulatory Visit: Payer: Self-pay | Admitting: *Deleted

## 2014-09-11 ENCOUNTER — Encounter: Payer: Self-pay | Admitting: Cardiology

## 2014-09-11 ENCOUNTER — Ambulatory Visit (INDEPENDENT_AMBULATORY_CARE_PROVIDER_SITE_OTHER): Payer: 59 | Admitting: Cardiology

## 2014-09-11 VITALS — BP 120/76 | HR 80 | Ht 67.0 in | Wt 243.7 lb

## 2014-09-11 DIAGNOSIS — I447 Left bundle-branch block, unspecified: Secondary | ICD-10-CM

## 2014-09-11 DIAGNOSIS — E785 Hyperlipidemia, unspecified: Secondary | ICD-10-CM | POA: Diagnosis not present

## 2014-09-11 DIAGNOSIS — Z95810 Presence of automatic (implantable) cardiac defibrillator: Secondary | ICD-10-CM

## 2014-09-11 DIAGNOSIS — I5022 Chronic systolic (congestive) heart failure: Secondary | ICD-10-CM

## 2014-09-11 DIAGNOSIS — I428 Other cardiomyopathies: Secondary | ICD-10-CM

## 2014-09-11 DIAGNOSIS — I429 Cardiomyopathy, unspecified: Secondary | ICD-10-CM

## 2014-09-11 MED ORDER — SPIRONOLACTONE 25 MG PO TABS: 25.0000 mg | ORAL_TABLET | Freq: Every day | ORAL | Status: DC

## 2014-09-11 MED ORDER — FUROSEMIDE 40 MG PO TABS: 40.0000 mg | ORAL_TABLET | ORAL | Status: DC | PRN

## 2014-09-11 NOTE — Patient Instructions (Signed)
Continue your current therapy  I will see you in 6 months.   

## 2014-09-11 NOTE — Progress Notes (Signed)
Jonathon Avila Date of Birth: [birth year]   History of Present Illness: Jonathon Avila is seen  today for followup of CHF. 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 He is status post biventricular ICD. This did result in significant improvement in LV function. Last EF 45-50% in October 2014.  He is doing well. He remains active. He denies dyspnea or chest pain. No palpitations or dizziness. He states his primary care reduced lisinopril from 10 mg to 5 mg due to low BP. Zocor dose was also reduced. He takes lasix prn.   Current Outpatient Prescriptions on File Prior to Visit  Medication Sig Dispense Refill  . aspirin EC 81 MG tablet Take 1 tablet (81 mg total) by mouth daily. 90 tablet 3  . carvedilol (COREG) 25 MG tablet TAKE ONE TABLET BY MOUTH TWICE DAILY WITH MEALS 60 tablet 6  . cholecalciferol (VITAMIN D) 1000 UNITS tablet Take 2,000 Units by mouth 2 (two) times daily.     . fluticasone (FLONASE) 50 MCG/ACT nasal spray Place 2 sprays into the nose daily.    [name] lisinopril (PRINIVIL,ZESTRIL) 5 MG tablet Take 5 mg by mouth daily.    . Loratadine (CLARITIN PO) Take 1 tablet by mouth as needed.     . simvastatin (ZOCOR) 20 MG tablet Take 20 mg by mouth daily.     No current facility-administered medications on file prior to visit.    Allergies  Allergen Reactions  . Codeine     Upset stomach   . Shellfish-Derived Products Nausea And Vomiting    Past Medical History  Diagnosis Date  . Hyperlipidemia   . LBBB (left bundle branch block)   . Nonischemic dilated cardiomyopathy   . Mitral insufficiency   . Pulmonary hypertension   . LV dysfunction   . Chronic systolic CHF (congestive heart failure)   . 1751 lead 06/10/2011    6949 rate sense portion was replaced with a Medtronic 5076-lead 2010     Past Surgical History  Procedure Laterality Date  . Cardiac catheterization  10/25/2001    EF 65%  . Cholecystectomy    . Icd     BIVENTRICULAR  . US echocardiography  02/19/2009    EF 45%    History  Smoking status  . Never Smoker   Smokeless tobacco  . Not on file    History  Alcohol Use No    History reviewed. No pertinent family history.  Review of Systems: As noted in history of present illness.  All other systems were reviewed and are negative.  Physical Exam: BP 120/76 mmHg  Pulse 80  Ht 5\' 7"  (1.702 m)  Wt 243 lb 11.2 oz (110.542 kg)  BMI 38.16 kg/m2 He is an overweight white male in no acute distress.  HEENT exam is normal.  The carotids are 2+ without bruits.  There is no thyromegaly.  There is no JVD.  The lungs are clear.    The heart exam reveals a regular rate with a normal S1 and S2.  There are no murmurs, gallops, or rubs.  The PMI is not displaced.  ICD is in place in the left upper chest. Abdominal exam reveals good bowel sounds.   There is no hepatosplenomegaly.  There are no masses.  Exam of the legs reveal no clubbing, cyanosis, or edema.  The distal pulses are intact.  Cranial nerves II - XII are intact.  Motor and sensory functions are intact.  The gait is  normal.  LABORATORY DATA: ICD check in Octoer 2015 showed 99% biv pacing. One episode of NSVT.  Echo:Study Conclusions 06/28/13  - Left ventricle: The cavity size was mildly dilated. Wall thickness was normal. Systolic function was mildly reduced. The estimated ejection fraction was in the range of 45% to 50%. Diffuse hypokinesis. Doppler parameters are consistent with abnormal left ventricular relaxation (grade 1 diastolic dysfunction). - Left atrium: The atrium was mildly dilated. - Right ventricle: The cavity size was mildly dilated. - Right atrium: The atrium was mildly dilated.     Assessment / Plan: 1. Chronic systolic congestive heart failure. Ejection fraction of 45-50%. He appears to be well compensated. He is on optimal medical therapy with an ACE inhibitor, carvedilol, and Aldactone. He has a biventricular  pacemaker in place. I've made no changes in his medications today. Chemistries reviewed from primary care in November with normal potassium and renal indices.   2. Status post ICD/biventricular pacemaker. Continue followup in EP clinic.  3. Obesity. Encouraged continued weight loss.  4. Left bundle branch block.

## 2014-10-02 ENCOUNTER — Ambulatory Visit (INDEPENDENT_AMBULATORY_CARE_PROVIDER_SITE_OTHER): Payer: 59 | Admitting: *Deleted

## 2014-10-02 ENCOUNTER — Telehealth: Payer: Self-pay | Admitting: Cardiology

## 2014-10-02 ENCOUNTER — Encounter: Payer: Self-pay | Admitting: Internal Medicine

## 2014-10-02 DIAGNOSIS — I429 Cardiomyopathy, unspecified: Secondary | ICD-10-CM | POA: Diagnosis not present

## 2014-10-02 DIAGNOSIS — I5022 Chronic systolic (congestive) heart failure: Secondary | ICD-10-CM | POA: Diagnosis not present

## 2014-10-02 DIAGNOSIS — I428 Other cardiomyopathies: Secondary | ICD-10-CM

## 2014-10-02 LAB — MDC_IDC_ENUM_SESS_TYPE_REMOTE
Brady Statistic AP VP Percent: 26.94 %
Brady Statistic AP VS Percent: 0 %
Brady Statistic AS VP Percent: 73.06 %
Brady Statistic AS VS Percent: 0 %
Brady Statistic RA Percent Paced: 26.94 %
Brady Statistic RV Percent Paced: 100 %
Date Time Interrogation Session: 20160125212427
HighPow Impedance: 57 Ohm
HighPow Impedance: 86 Ohm
Lead Channel Impedance Value: 323 Ohm
Lead Channel Impedance Value: 779 Ohm
Lead Channel Impedance Value: 855 Ohm
Lead Channel Pacing Threshold Amplitude: 0.875 V
Lead Channel Pacing Threshold Pulse Width: 0.4 ms
Lead Channel Setting Pacing Amplitude: 2 V
Lead Channel Setting Pacing Amplitude: 2 V
Lead Channel Setting Pacing Pulse Width: 0.4 ms
Lead Channel Setting Pacing Pulse Width: 0.4 ms
MDC IDC MSMT BATTERY VOLTAGE: 2.69 V
MDC IDC MSMT LEADCHNL LV IMPEDANCE VALUE: 741 Ohm
MDC IDC MSMT LEADCHNL RA IMPEDANCE VALUE: 437 Ohm
MDC IDC MSMT LEADCHNL RA SENSING INTR AMPL: 4.75 mV
MDC IDC MSMT LEADCHNL RV SENSING INTR AMPL: 12.5 mV
MDC IDC SET LEADCHNL RV PACING AMPLITUDE: 2.5 V
MDC IDC SET LEADCHNL RV SENSING SENSITIVITY: 0.45 mV
MDC IDC SET ZONE DETECTION INTERVAL: 350 ms
Zone Setting Detection Interval: 300 ms
Zone Setting Detection Interval: 350 ms
Zone Setting Detection Interval: 450 ms

## 2014-10-02 NOTE — Telephone Encounter (Signed)
Spoke with pt and reminded pt of remote transmission that is due today. Pt verbalized understanding.   

## 2014-10-03 NOTE — Progress Notes (Signed)
Remote ICD transmission.   

## 2014-10-04 ENCOUNTER — Telehealth: Payer: Self-pay | Admitting: *Deleted

## 2014-10-04 NOTE — Telephone Encounter (Signed)
Pt's spouse heard a noise "that sounded like toe nails rubbing on the sheets" that came from pt's chest. Noises were heard since remote received on 10/02/14. That transmission shows normal diagnostics, no alerts or episodes. No new transmission since 10/02/14. Pt will send an updated manual remote.  Remote received. No alerts, no episodes. All diagnostics normal.   Pt nearing ERI, pt aware device will sound an alert if ERI occurs.

## 2014-10-16 ENCOUNTER — Encounter: Payer: Self-pay | Admitting: Cardiology

## 2014-11-27 DIAGNOSIS — N401 Enlarged prostate with lower urinary tract symptoms: Secondary | ICD-10-CM | POA: Insufficient documentation

## 2015-01-02 ENCOUNTER — Telehealth: Payer: Self-pay | Admitting: Cardiology

## 2015-01-02 ENCOUNTER — Ambulatory Visit (INDEPENDENT_AMBULATORY_CARE_PROVIDER_SITE_OTHER): Payer: 59 | Admitting: *Deleted

## 2015-01-02 DIAGNOSIS — I5022 Chronic systolic (congestive) heart failure: Secondary | ICD-10-CM

## 2015-01-02 DIAGNOSIS — I428 Other cardiomyopathies: Secondary | ICD-10-CM

## 2015-01-02 NOTE — Telephone Encounter (Signed)
LMOVM reminding pt to send remote transmission.   

## 2015-01-03 ENCOUNTER — Encounter: Payer: Self-pay | Admitting: Cardiology

## 2015-01-03 DIAGNOSIS — I429 Cardiomyopathy, unspecified: Secondary | ICD-10-CM

## 2015-01-03 DIAGNOSIS — I5022 Chronic systolic (congestive) heart failure: Secondary | ICD-10-CM

## 2015-01-03 LAB — MDC_IDC_ENUM_SESS_TYPE_REMOTE
Battery Voltage: 2.64 V
Brady Statistic AP VP Percent: 25.18 %
Brady Statistic AS VP Percent: 74.82 %
Brady Statistic AS VS Percent: 0 %
Brady Statistic RA Percent Paced: 25.18 %
Brady Statistic RV Percent Paced: 100 %
Date Time Interrogation Session: 20160427140551
HighPow Impedance: 52 Ohm
HighPow Impedance: 78 Ohm
Lead Channel Impedance Value: 703 Ohm
Lead Channel Impedance Value: 836 Ohm
Lead Channel Sensing Intrinsic Amplitude: 4.125 mV
Lead Channel Setting Pacing Amplitude: 2 V
Lead Channel Setting Pacing Amplitude: 2 V
Lead Channel Setting Pacing Amplitude: 2.5 V
Lead Channel Setting Pacing Pulse Width: 0.4 ms
MDC IDC MSMT LEADCHNL LV IMPEDANCE VALUE: 323 Ohm
MDC IDC MSMT LEADCHNL LV IMPEDANCE VALUE: 646 Ohm
MDC IDC MSMT LEADCHNL RA IMPEDANCE VALUE: 418 Ohm
MDC IDC MSMT LEADCHNL RV SENSING INTR AMPL: 13.125 mV
MDC IDC SET LEADCHNL LV PACING PULSEWIDTH: 0.4 ms
MDC IDC SET LEADCHNL RV SENSING SENSITIVITY: 0.45 mV
MDC IDC SET ZONE DETECTION INTERVAL: 300 ms
MDC IDC SET ZONE DETECTION INTERVAL: 350 ms
MDC IDC SET ZONE DETECTION INTERVAL: 450 ms
MDC IDC STAT BRADY AP VS PERCENT: 0 %
Zone Setting Detection Interval: 350 ms

## 2015-01-03 NOTE — Progress Notes (Signed)
Remote ICD transmission.   

## 2015-01-05 ENCOUNTER — Other Ambulatory Visit: Payer: Self-pay | Admitting: Internal Medicine

## 2015-01-22 ENCOUNTER — Telehealth: Payer: Self-pay | Admitting: Cardiology

## 2015-01-22 MED ORDER — CARVEDILOL 25 MG PO TABS: ORAL_TABLET | ORAL | Status: DC

## 2015-01-22 NOTE — Telephone Encounter (Signed)
Spoke with pt, aware refill sent to the pharmacy. 

## 2015-01-22 NOTE — Telephone Encounter (Signed)
°  1. Which medications need to be refilled? Carvedilol  2. Which pharmacy is medication to be sent to?[facility] on [address]   3. Do they need a 30 day or 90 day supply? 90  4. Would they like a call back once the medication has been sent to the pharmacy? Yes, he has 3 pills left

## 2015-01-25 ENCOUNTER — Telehealth: Payer: Self-pay | Admitting: Internal Medicine

## 2015-01-25 NOTE — Telephone Encounter (Signed)
Informed pt it is due to battery needs to be checked monthly. Pt verbalized understanding.

## 2015-01-25 NOTE — Telephone Encounter (Signed)
New Message  Pt wanted to speak w/ Rn about remote transmission on 5/24. Pt is not aware why it is necessary. Please call back and discuss.

## 2015-01-26 ENCOUNTER — Encounter: Payer: Self-pay | Admitting: Cardiology

## 2015-01-30 ENCOUNTER — Ambulatory Visit (INDEPENDENT_AMBULATORY_CARE_PROVIDER_SITE_OTHER): Payer: 59 | Admitting: *Deleted

## 2015-01-30 DIAGNOSIS — Z95810 Presence of automatic (implantable) cardiac defibrillator: Secondary | ICD-10-CM

## 2015-01-30 NOTE — Progress Notes (Signed)
Remote ICD transmission.   

## 2015-01-31 ENCOUNTER — Encounter: Payer: Self-pay | Admitting: Internal Medicine

## 2015-01-31 LAB — CUP PACEART REMOTE DEVICE CHECK
Battery Voltage: 2.63 V
Brady Statistic AP VS Percent: 0 %
Brady Statistic AS VS Percent: 0 %
HIGH POWER IMPEDANCE MEASURED VALUE: 53 Ohm
HighPow Impedance: 77 Ohm
Lead Channel Impedance Value: 342 Ohm
Lead Channel Impedance Value: 855 Ohm
Lead Channel Pacing Threshold Amplitude: 0.875 V
Lead Channel Pacing Threshold Pulse Width: 0.4 ms
Lead Channel Sensing Intrinsic Amplitude: 13.125 mV
Lead Channel Setting Pacing Amplitude: 2 V
Lead Channel Setting Pacing Amplitude: 2 V
Lead Channel Setting Pacing Pulse Width: 0.4 ms
Lead Channel Setting Pacing Pulse Width: 0.4 ms
MDC IDC MSMT LEADCHNL LV IMPEDANCE VALUE: 646 Ohm
MDC IDC MSMT LEADCHNL RA IMPEDANCE VALUE: 494 Ohm
MDC IDC MSMT LEADCHNL RA SENSING INTR AMPL: 4.375 mV
MDC IDC MSMT LEADCHNL RA SENSING INTR AMPL: 4.375 mV
MDC IDC MSMT LEADCHNL RV IMPEDANCE VALUE: 703 Ohm
MDC IDC MSMT LEADCHNL RV SENSING INTR AMPL: 13.125 mV
MDC IDC SESS DTM: 20160524124238
MDC IDC SET LEADCHNL RV PACING AMPLITUDE: 2.5 V
MDC IDC SET LEADCHNL RV SENSING SENSITIVITY: 0.45 mV
MDC IDC SET ZONE DETECTION INTERVAL: 300 ms
MDC IDC SET ZONE DETECTION INTERVAL: 450 ms
MDC IDC STAT BRADY AP VP PERCENT: 16.14 %
MDC IDC STAT BRADY AS VP PERCENT: 83.86 %
MDC IDC STAT BRADY RA PERCENT PACED: 16.14 %
MDC IDC STAT BRADY RV PERCENT PACED: 100 %
Zone Setting Detection Interval: 350 ms
Zone Setting Detection Interval: 350 ms

## 2015-02-14 ENCOUNTER — Encounter: Payer: Self-pay | Admitting: Cardiology

## 2015-02-21 ENCOUNTER — Encounter: Payer: Self-pay | Admitting: Internal Medicine

## 2015-03-05 ENCOUNTER — Ambulatory Visit (INDEPENDENT_AMBULATORY_CARE_PROVIDER_SITE_OTHER): Payer: 59 | Admitting: *Deleted

## 2015-03-05 DIAGNOSIS — Z95810 Presence of automatic (implantable) cardiac defibrillator: Secondary | ICD-10-CM

## 2015-03-05 NOTE — Progress Notes (Signed)
Remote ICD transmission.   

## 2015-03-07 LAB — CUP PACEART REMOTE DEVICE CHECK
Brady Statistic AP VS Percent: 0 %
Brady Statistic AS VS Percent: 0 %
Brady Statistic RA Percent Paced: 19.42 %
Date Time Interrogation Session: 20160627073329
HIGH POWER IMPEDANCE MEASURED VALUE: 53 Ohm
HIGH POWER IMPEDANCE MEASURED VALUE: 75 Ohm
Lead Channel Impedance Value: 342 Ohm
Lead Channel Impedance Value: 418 Ohm
Lead Channel Impedance Value: 684 Ohm
Lead Channel Impedance Value: 684 Ohm
Lead Channel Impedance Value: 855 Ohm
Lead Channel Pacing Threshold Amplitude: 0.875 V
Lead Channel Pacing Threshold Pulse Width: 0.4 ms
Lead Channel Sensing Intrinsic Amplitude: 13.125 mV
Lead Channel Sensing Intrinsic Amplitude: 4.375 mV
Lead Channel Sensing Intrinsic Amplitude: 4.375 mV
Lead Channel Setting Pacing Amplitude: 2 V
Lead Channel Setting Pacing Amplitude: 2 V
Lead Channel Setting Pacing Pulse Width: 0.4 ms
Lead Channel Setting Sensing Sensitivity: 0.45 mV
MDC IDC MSMT BATTERY VOLTAGE: 2.63 V
MDC IDC MSMT LEADCHNL RV SENSING INTR AMPL: 13.125 mV
MDC IDC SET LEADCHNL LV PACING PULSEWIDTH: 0.4 ms
MDC IDC SET LEADCHNL RV PACING AMPLITUDE: 2.5 V
MDC IDC SET ZONE DETECTION INTERVAL: 450 ms
MDC IDC STAT BRADY AP VP PERCENT: 19.42 %
MDC IDC STAT BRADY AS VP PERCENT: 80.58 %
MDC IDC STAT BRADY RV PERCENT PACED: 100 %
Zone Setting Detection Interval: 300 ms
Zone Setting Detection Interval: 350 ms
Zone Setting Detection Interval: 350 ms

## 2015-03-15 ENCOUNTER — Encounter: Payer: Self-pay | Admitting: Cardiology

## 2015-03-15 ENCOUNTER — Ambulatory Visit (INDEPENDENT_AMBULATORY_CARE_PROVIDER_SITE_OTHER): Payer: 59 | Admitting: Cardiology

## 2015-03-15 VITALS — BP 116/78 | HR 80 | Ht 67.0 in | Wt 254.4 lb

## 2015-03-15 DIAGNOSIS — I447 Left bundle-branch block, unspecified: Secondary | ICD-10-CM | POA: Diagnosis not present

## 2015-03-15 DIAGNOSIS — I5022 Chronic systolic (congestive) heart failure: Secondary | ICD-10-CM | POA: Diagnosis not present

## 2015-03-15 DIAGNOSIS — E785 Hyperlipidemia, unspecified: Secondary | ICD-10-CM

## 2015-03-15 DIAGNOSIS — Z95810 Presence of automatic (implantable) cardiac defibrillator: Secondary | ICD-10-CM | POA: Diagnosis not present

## 2015-03-15 DIAGNOSIS — I429 Cardiomyopathy, unspecified: Secondary | ICD-10-CM

## 2015-03-15 DIAGNOSIS — I428 Other cardiomyopathies: Secondary | ICD-10-CM

## 2015-03-15 NOTE — Patient Instructions (Signed)
Continue your current medication  You need to focus on weight loss.   I will see you in 6 months.

## 2015-03-15 NOTE — Progress Notes (Signed)
Jonathon Avila Date of Birth: 1956/06/27   History of Present Illness: Jonathon Avila is seen  today for followup of CHF. 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 He is status post biventricular ICD. This did result in significant improvement in LV function. Last EF 45-50% in October 2014.  He is doing well. He remains active. He denies dyspnea or chest pain. No palpitations or dizziness. He did move in with his daughter. He has gained weight- 11 lbs. Admits he needs to do better with portion control and still drinks 3 sodas per day.  Current Outpatient Prescriptions on File Prior to Visit  Medication Sig Dispense Refill  . aspirin EC 81 MG tablet Take 1 tablet (81 mg total) by mouth daily. 90 tablet 3  . carvedilol (COREG) 25 MG tablet TAKE ONE TABLET BY MOUTH TWICE DAILY WITH MEALS 180 tablet 3  . cholecalciferol (VITAMIN D) 1000 UNITS tablet Take 2,000 Units by mouth 2 (two) times daily.     . fluticasone (FLONASE) 50 MCG/ACT nasal spray Place 2 sprays into the nose daily.    . furosemide (LASIX) 40 MG tablet Take 1 tablet (40 mg total) by mouth as needed. 30 tablet 6  . lisinopril (PRINIVIL,ZESTRIL) 5 MG tablet Take 5 mg by mouth daily.    . Loratadine (CLARITIN PO) Take 1 tablet by mouth as needed.     . simvastatin (ZOCOR) 20 MG tablet Take 20 mg by mouth daily.    [name] spironolactone (ALDACTONE) 25 MG tablet Take 1 tablet (25 mg total) by mouth at bedtime. 30 tablet 6   No current facility-administered medications on file prior to visit.    Allergies  Allergen Reactions  . Codeine     Upset stomach   . Shellfish-Derived Products Nausea And Vomiting    Past Medical History  Diagnosis Date  . Hyperlipidemia   . LBBB (left bundle branch block)   . Nonischemic dilated cardiomyopathy   . Mitral insufficiency   . Pulmonary hypertension   . LV dysfunction   . Chronic systolic CHF (congestive heart failure)   . 4742 lead 06/10/2011    6949  rate sense portion was replaced with a Medtronic 5076-lead 2010     Past Surgical History  Procedure Laterality Date  . Cardiac catheterization  10/25/2001    EF 65%  . Cholecystectomy    . Icd      BIVENTRICULAR  . US echocardiography  02/19/2009    EF 45%    History  Smoking status  . Never Smoker   Smokeless tobacco  . Not on file    History  Alcohol Use No    History reviewed. No pertinent family history.  Review of Systems: As noted in history of present illness.  All other systems were reviewed and are negative.  Physical Exam: BP 116/78 mmHg  Pulse 80  Ht 5\' 7"  (1.702 m)  Wt 115.395 kg (254 lb 6.4 oz)  BMI 39.84 kg/m2 He is an overweight white male in no acute distress.  HEENT exam is normal.  The carotids are 2+ without bruits.  There is no thyromegaly.  There is no JVD.  The lungs are clear.    The heart exam reveals a regular rate with a normal S1 and S2.  There are no murmurs, gallops, or rubs.  The PMI is not displaced.  ICD is in place in the left upper chest. Abdominal exam reveals good bowel sounds.   There  is no hepatosplenomegaly.  There are no masses.  Exam of the legs reveal no clubbing, cyanosis, or edema.  The distal pulses are intact.  Cranial nerves II - XII are intact.  Motor and sensory functions are intact.  The gait is normal.  LABORATORY DATA: ICD check in June 2016 showed 99% biv pacing. No significant arrhythmias. He is approaching elective replacement parameter.   Echo:Study Conclusions 06/28/13  - Left ventricle: The cavity size was mildly dilated. Wall thickness was normal. Systolic function was mildly reduced. The estimated ejection fraction was in the range of 45% to 50%. Diffuse hypokinesis. Doppler parameters are consistent with abnormal left ventricular relaxation (grade 1 diastolic dysfunction). - Left atrium: The atrium was mildly dilated. - Right ventricle: The cavity size was mildly dilated. - Right atrium: The atrium was  mildly dilated.     Assessment / Plan: 1. Chronic systolic congestive heart failure. Ejection fraction of 45-50%. He appears to be well compensated. He is on optimal medical therapy with an ACE inhibitor, carvedilol, and Aldactone. He has a biventricular pacemaker in place. I've made no changes in his medications today.   2. Status post ICD/biventricular pacemaker. Continue followup in EP clinic. Anticipate will need elective replacement soon.  3. Obesity. 11 lb weight gain.  Encouraged weight loss- restrict carbs and sodas.  4. Left bundle branch block.  Follow up in 6 months

## 2015-04-03 ENCOUNTER — Ambulatory Visit (INDEPENDENT_AMBULATORY_CARE_PROVIDER_SITE_OTHER): Payer: 59 | Admitting: *Deleted

## 2015-04-03 DIAGNOSIS — I428 Other cardiomyopathies: Secondary | ICD-10-CM

## 2015-04-03 DIAGNOSIS — I429 Cardiomyopathy, unspecified: Secondary | ICD-10-CM | POA: Diagnosis not present

## 2015-04-03 DIAGNOSIS — I5022 Chronic systolic (congestive) heart failure: Secondary | ICD-10-CM

## 2015-04-03 DIAGNOSIS — I447 Left bundle-branch block, unspecified: Secondary | ICD-10-CM

## 2015-04-03 DIAGNOSIS — Z95810 Presence of automatic (implantable) cardiac defibrillator: Secondary | ICD-10-CM | POA: Diagnosis not present

## 2015-04-13 ENCOUNTER — Encounter: Payer: Self-pay | Admitting: Cardiology

## 2015-04-17 LAB — CUP PACEART REMOTE DEVICE CHECK
Battery Voltage: 2.62 V
Brady Statistic AP VP Percent: 22.56 %
Brady Statistic AP VS Percent: 0 %
Brady Statistic AS VS Percent: 0 %
Brady Statistic RA Percent Paced: 22.56 %
HighPow Impedance: 57 Ohm
HighPow Impedance: 80 Ohm
Lead Channel Impedance Value: 437 Ohm
Lead Channel Impedance Value: 741 Ohm
Lead Channel Impedance Value: 855 Ohm
Lead Channel Pacing Threshold Amplitude: 0.875 V
Lead Channel Sensing Intrinsic Amplitude: 4.875 mV
Lead Channel Setting Pacing Amplitude: 2 V
Lead Channel Setting Pacing Pulse Width: 0.4 ms
Lead Channel Setting Pacing Pulse Width: 0.4 ms
MDC IDC MSMT LEADCHNL LV IMPEDANCE VALUE: 342 Ohm
MDC IDC MSMT LEADCHNL LV IMPEDANCE VALUE: 646 Ohm
MDC IDC MSMT LEADCHNL LV PACING THRESHOLD PULSEWIDTH: 0.4 ms
MDC IDC MSMT LEADCHNL RA SENSING INTR AMPL: 4.875 mV
MDC IDC MSMT LEADCHNL RV SENSING INTR AMPL: 13.125 mV
MDC IDC MSMT LEADCHNL RV SENSING INTR AMPL: 13.125 mV
MDC IDC SESS DTM: 20160726073529
MDC IDC SET LEADCHNL LV PACING AMPLITUDE: 2 V
MDC IDC SET LEADCHNL RV PACING AMPLITUDE: 2.5 V
MDC IDC SET LEADCHNL RV SENSING SENSITIVITY: 0.45 mV
MDC IDC STAT BRADY AS VP PERCENT: 77.43 %
MDC IDC STAT BRADY RV PERCENT PACED: 100 %
Zone Setting Detection Interval: 300 ms
Zone Setting Detection Interval: 350 ms
Zone Setting Detection Interval: 350 ms
Zone Setting Detection Interval: 450 ms

## 2015-04-20 ENCOUNTER — Encounter: Payer: Self-pay | Admitting: Internal Medicine

## 2015-04-25 ENCOUNTER — Other Ambulatory Visit: Payer: Self-pay | Admitting: Cardiology

## 2015-05-10 ENCOUNTER — Encounter: Payer: Self-pay | Admitting: Cardiology

## 2015-05-13 ENCOUNTER — Emergency Department (HOSPITAL_COMMUNITY): Payer: 59

## 2015-05-13 ENCOUNTER — Encounter (HOSPITAL_COMMUNITY): Payer: Self-pay | Admitting: *Deleted

## 2015-05-13 ENCOUNTER — Emergency Department (HOSPITAL_COMMUNITY)
Admission: EM | Admit: 2015-05-13 | Discharge: 2015-05-13 | Disposition: A | Discharge status: Home / Self Care | Payer: 59 | Attending: Emergency Medicine | Admitting: Emergency Medicine

## 2015-05-13 DIAGNOSIS — Z7951 Long term (current) use of inhaled steroids: Secondary | ICD-10-CM | POA: Diagnosis not present

## 2015-05-13 DIAGNOSIS — Z7982 Long term (current) use of aspirin: Secondary | ICD-10-CM | POA: Insufficient documentation

## 2015-05-13 DIAGNOSIS — R509 Fever, unspecified: Secondary | ICD-10-CM

## 2015-05-13 DIAGNOSIS — B349 Viral infection, unspecified: Secondary | ICD-10-CM | POA: Diagnosis not present

## 2015-05-13 DIAGNOSIS — E785 Hyperlipidemia, unspecified: Secondary | ICD-10-CM | POA: Diagnosis not present

## 2015-05-13 DIAGNOSIS — Z79899 Other long term (current) drug therapy: Secondary | ICD-10-CM | POA: Diagnosis not present

## 2015-05-13 DIAGNOSIS — Z9889 Other specified postprocedural states: Secondary | ICD-10-CM | POA: Insufficient documentation

## 2015-05-13 DIAGNOSIS — I5022 Chronic systolic (congestive) heart failure: Secondary | ICD-10-CM | POA: Insufficient documentation

## 2015-05-13 LAB — COMPREHENSIVE METABOLIC PANEL
ALT: 23 U/L (ref 17–63)
AST: 22 U/L (ref 15–41)
Albumin: 3.8 g/dL (ref 3.5–5.0)
Alkaline Phosphatase: 69 U/L (ref 38–126)
Anion gap: 7 (ref 5–15)
BILIRUBIN TOTAL: 1 mg/dL (ref 0.3–1.2)
BUN: 9 mg/dL (ref 6–20)
CO2: 26 mmol/L (ref 22–32)
CREATININE: 0.82 mg/dL (ref 0.61–1.24)
Calcium: 9.3 mg/dL (ref 8.9–10.3)
Chloride: 98 mmol/L — ABNORMAL LOW (ref 101–111)
GFR calc Af Amer: >60 mL/min (ref >60)
Glucose, Bld: 123 mg/dL — ABNORMAL HIGH (ref 65–99)
Potassium: 4.2 mmol/L (ref 3.5–5.1)
Sodium: 131 mmol/L — ABNORMAL LOW (ref 135–145)
TOTAL PROTEIN: 7.3 g/dL (ref 6.5–8.1)

## 2015-05-13 LAB — URINALYSIS, ROUTINE W REFLEX MICROSCOPIC
Bilirubin Urine: NEGATIVE
GLUCOSE, UA: NEGATIVE mg/dL
KETONES UR: NEGATIVE mg/dL
Leukocytes, UA: NEGATIVE
Nitrite: NEGATIVE
PROTEIN: NEGATIVE mg/dL
Specific Gravity, Urine: 1.025 (ref 1.005–1.030)
Urobilinogen, UA: 1 mg/dL (ref 0.0–1.0)
pH: 5.5 (ref 5.0–8.0)

## 2015-05-13 LAB — CBC WITH DIFFERENTIAL/PLATELET
BASOS PCT: 0 % (ref 0–1)
Basophils Absolute: 0 10*3/uL (ref 0.0–0.1)
Eosinophils Absolute: 0.2 10*3/uL (ref 0.0–0.7)
Eosinophils Relative: 2 % (ref 0–5)
HEMATOCRIT: 41.4 % (ref 39.0–52.0)
Hemoglobin: 14.2 g/dL (ref 13.0–17.0)
LYMPHS ABS: 1.8 10*3/uL (ref 0.7–4.0)
Lymphocytes Relative: 14 % (ref 12–46)
MCH: 29 pg (ref 26.0–34.0)
MCHC: 34.3 g/dL (ref 30.0–36.0)
MCV: 84.5 fL (ref 78.0–100.0)
MONO ABS: 1.3 10*3/uL — AB (ref 0.1–1.0)
MONOS PCT: 10 % (ref 3–12)
NEUTROS ABS: 9.2 10*3/uL — AB (ref 1.7–7.7)
Neutrophils Relative %: 74 % (ref 43–77)
Platelets: 169 10*3/uL (ref 150–400)
RBC: 4.9 MIL/uL (ref 4.22–5.81)
RDW: 14.5 % (ref 11.5–15.5)
WBC: 12.5 10*3/uL — ABNORMAL HIGH (ref 4.0–10.5)

## 2015-05-13 LAB — URINE MICROSCOPIC-ADD ON

## 2015-05-13 LAB — I-STAT CG4 LACTIC ACID, ED: Lactic Acid, Venous: 1.05 mmol/L (ref 0.5–2.0)

## 2015-05-13 NOTE — Discharge Instructions (Signed)
Fever, Adult A fever is a higher than normal body temperature. In an adult, an oral temperature around 98.6 F (37 C) is considered normal. A temperature of 100.4 F (38 C) or higher is generally considered a fever. Mild or moderate fevers generally have no long-term effects and often do not require treatment. Extreme fever (greater than or equal to 106 F or 41.1 C) can cause seizures. The sweating that may occur with repeated or prolonged fever may cause dehydration. Elderly people can develop confusion during a fever. A measured temperature can vary with:  Age.  Time of day.  Method of measurement (mouth, underarm, rectal, or ear). The fever is confirmed by taking a temperature with a thermometer. Temperatures can be taken different ways. Some methods are accurate and some are not.  An oral temperature is used most commonly. Electronic thermometers are fast and accurate.  An ear temperature will only be accurate if the thermometer is positioned as recommended by the manufacturer.  A rectal temperature is accurate and done for those adults who have a condition where an oral temperature cannot be taken.  An underarm (axillary) temperature is not accurate and not recommended. Fever is a symptom, not a disease.  CAUSES   Infections commonly cause fever.  Some noninfectious causes for fever include:  Some arthritis conditions.  Some thyroid or adrenal gland conditions.  Some immune system conditions.  Some types of cancer.  A medicine reaction.  High doses of certain street drugs such as methamphetamine.  Dehydration.  Exposure to high outside or room temperatures.  Occasionally, the source of a fever cannot be determined. This is sometimes called a "fever of unknown origin" (FUO).  Some situations may lead to a temporary rise in body temperature that may go away on its own. Examples are:  Childbirth.  Surgery.  Intense exercise. HOME CARE INSTRUCTIONS   Take  appropriate medicines for fever. Follow dosing instructions carefully. If you use acetaminophen to reduce the fever, be careful to avoid taking other medicines that also contain acetaminophen. Do not take aspirin for a fever if you are younger than age 67. There is an association with Reye's syndrome. Reye's syndrome is a rare but potentially deadly disease.  If an infection is present and antibiotics have been prescribed, take them as directed. Finish them even if you start to feel better.  Rest as needed.  Maintain an adequate fluid intake. To prevent dehydration during an illness with prolonged or recurrent fever, you may need to drink extra fluid.Drink enough fluids to keep your urine clear or pale yellow.  Sponging or bathing with room temperature water may help reduce body temperature. Do not use ice water or alcohol sponge baths.  Dress comfortably, but do not over-bundle. SEEK MEDICAL CARE IF:   You are unable to keep fluids down.  You develop vomiting or diarrhea.  You are not feeling at least partly better after 3 days.  You develop new symptoms or problems. SEEK IMMEDIATE MEDICAL CARE IF:   You have shortness of breath or trouble breathing.  You develop excessive weakness.  You are dizzy or you faint.  You are extremely thirsty or you are making little or no urine.  You develop new pain that was not there before (such as in the head, neck, chest, back, or abdomen).  You have persistent vomiting and diarrhea for more than 1 to 2 days.  You develop a stiff neck or your eyes become sensitive to light.  You develop a  skin rash.  You have a fever or persistent symptoms for more than 2 to 3 days.  You have a fever and your symptoms suddenly get worse. MAKE SURE YOU:   Understand these instructions.  Will watch your condition.  Will get help right away if you are not doing well or get worse. Document Released: 02/18/2001 Document Revised: 01/09/2014 Document  Reviewed: 06/26/2011 [facility] Patient Information 2015 [place], [place]. This information is not intended to replace advice given to you by your health care provider. Make sure you discuss any questions you have with your health care provider.  Viral Infections A viral infection can be caused by different types of viruses.Most viral infections are not serious and resolve on their own. However, some infections may cause severe symptoms and may lead to further complications. SYMPTOMS Viruses can frequently cause:  Minor sore throat.  Aches and pains.  Headaches.  Runny nose.  Different types of rashes.  Watery eyes.  Tiredness.  Cough.  Loss of appetite.  Gastrointestinal infections, resulting in nausea, vomiting, and diarrhea. These symptoms do not respond to antibiotics because the infection is not caused by bacteria. However, you might catch a bacterial infection following the viral infection. This is sometimes called a "superinfection." Symptoms of such a bacterial infection may include:  Worsening sore throat with pus and difficulty swallowing.  Swollen neck glands.  Chills and a high or persistent fever.  Severe headache.  Tenderness over the sinuses.  Persistent overall ill feeling (malaise), muscle aches, and tiredness (fatigue).  Persistent cough.  Yellow, green, or brown mucus production with coughing. HOME CARE INSTRUCTIONS   Only take over-the-counter or prescription medicines for pain, discomfort, diarrhea, or fever as directed by your caregiver.  Drink enough water and fluids to keep your urine clear or pale yellow. Sports drinks can provide valuable electrolytes, sugars, and hydration.  Get plenty of rest and maintain proper nutrition. Soups and broths with crackers or rice are fine. SEEK IMMEDIATE MEDICAL CARE IF:   You have severe headaches, shortness of breath, chest pain, neck pain, or an unusual rash.  You have uncontrolled vomiting, diarrhea,  or you are unable to keep down fluids.  You or your child has an oral temperature above 102 F (38.9 C), not controlled by medicine.  Your baby is older than 3 months with a rectal temperature of 102 F (38.9 C) or higher.  Your baby is 101 months old or younger with a rectal temperature of 100.4 F (38 C) or higher. MAKE SURE YOU:   Understand these instructions.  Will watch your condition.  Will get help right away if you are not doing well or get worse. Document Released: 06/04/2005 Document Revised: 11/17/2011 Document Reviewed: 12/30/2010 [facility] Patient Information 2015 [place], [place]. This information is not intended to replace advice given to you by your health care provider. Make sure you discuss any questions you have with your health care provider.

## 2015-05-13 NOTE — ED Provider Notes (Signed)
CSN: [id number]     Arrival date & time 05/13/15  1311 History   First MD Initiated Contact with Patient 05/13/15 1601     Chief Complaint  Patient presents with  . Fever     (Consider location/radiation/quality/duration/timing/severity/associated sxs/prior Treatment) Patient is a 59 y.o. male presenting with fever.  Fever Max temp prior to arrival:  101.5 Temp source:  Oral Severity:  Moderate Onset quality:  Gradual Duration:  2 days Timing:  Intermittent Progression:  Improving Chronicity:  New Relieved by:  Nothing Worsened by:  Nothing tried Associated symptoms: chills, congestion (mild) and headaches   Associated symptoms: no chest pain, no cough, no diarrhea, no dysuria, no nausea, no rhinorrhea, no sore throat and no vomiting     Past Medical History  Diagnosis Date  . Hyperlipidemia   . LBBB (left bundle branch block)   . Nonischemic dilated cardiomyopathy   . Mitral insufficiency   . Pulmonary hypertension   . LV dysfunction   . Chronic systolic CHF (congestive heart failure)   . 8882 lead 06/10/2011    6949 rate sense portion was replaced with a Medtronic 5076-lead 2010    Past Surgical History  Procedure Laterality Date  . Cardiac catheterization  10/25/2001    EF 65%  . Cholecystectomy    . Icd      BIVENTRICULAR  . US echocardiography  02/19/2009    EF 45%   No family history on file. Social History  Substance Use Topics  . Smoking status: Never Smoker   . Smokeless tobacco: None  . Alcohol Use: No    Review of Systems  Constitutional: Positive for fever and chills.  HENT: Positive for congestion (mild). Negative for rhinorrhea and sore throat.   Respiratory: Negative for cough.   Cardiovascular: Negative for chest pain.  Gastrointestinal: Negative for nausea, vomiting and diarrhea.  Genitourinary: Negative for dysuria.  Neurological: Positive for headaches.  All other systems reviewed and are negative.     Allergies  Codeine and  Shellfish-derived products  Home Medications   Prior to Admission medications   Medication Sig Start Date End Date Taking? Authorizing Provider  aspirin EC 81 MG tablet Take 1 tablet (81 mg total) by mouth daily. 06/09/13   Peter M Martinique, MD  carvedilol (COREG) 25 MG tablet TAKE ONE TABLET BY MOUTH TWICE DAILY WITH MEALS 01/22/15   Peter M Martinique, MD  cholecalciferol (VITAMIN D) 1000 UNITS tablet Take 2,000 Units by mouth 2 (two) times daily.     Historical Provider, MD  cyclobenzaprine (FLEXERIL) 5 MG tablet Take 5-10 mg by mouth daily. Take 1 tab daily at bedtime 03/09/15 03/08/16  Historical Provider, MD  fluticasone (FLONASE) 50 MCG/ACT nasal spray Place 2 sprays into the nose daily.    Historical Provider, MD  furosemide (LASIX) 40 MG tablet Take 1 tablet (40 mg total) by mouth as needed. 09/11/14   Peter M Martinique, MD  lisinopril (PRINIVIL,ZESTRIL) 5 MG tablet Take 5 mg by mouth daily.    Historical Provider, MD  Loratadine (CLARITIN PO) Take 1 tablet by mouth as needed.     Historical Provider, MD  montelukast (SINGULAIR) 10 MG tablet Take 10 mg by mouth daily. Take 1 tab daily at bedtime 02/01/15 02/01/16  Historical Provider, MD  simvastatin (ZOCOR) 20 MG tablet Take 20 mg by mouth daily.    Historical Provider, MD  spironolactone (ALDACTONE) 25 MG tablet TAKE ONE TABLET BY MOUTH AT BEDTIME 04/26/15   Peter M Martinique, MD  BP 138/67 mmHg  Pulse 88  Temp(Src) 99.3 F (37.4 C) (Oral)  Resp 18  Ht 5\' 7"  (1.702 m)  Wt 250 lb (113.399 kg)  BMI 39.15 kg/m2  SpO2 97% Physical Exam  Constitutional: He is oriented to person, place, and time. He appears well-developed and well-nourished. No distress.  HENT:  Head: Normocephalic and atraumatic.  Right Ear: Tympanic membrane normal.  Left Ear: Tympanic membrane normal.  Nose: Right sinus exhibits no maxillary sinus tenderness and no frontal sinus tenderness. Left sinus exhibits no maxillary sinus tenderness and no frontal sinus tenderness.   Mouth/Throat: Oropharynx is clear and moist and mucous membranes are normal. No posterior oropharyngeal edema or posterior oropharyngeal erythema.  Eyes: Conjunctivae are normal. Pupils are equal, round, and reactive to light. No scleral icterus.  Neck: Neck supple.  Cardiovascular: Normal rate, regular rhythm, normal heart sounds and intact distal pulses.   No murmur heard. Pulmonary/Chest: Effort normal and breath sounds normal. No stridor. No respiratory distress. He has no wheezes. He has no rales.  Abdominal: Soft. He exhibits no distension. There is no tenderness.  Musculoskeletal: Normal range of motion. He exhibits no edema.  Neurological: He is alert and oriented to person, place, and time.  Skin: Skin is warm and dry. No rash noted.  Psychiatric: He has a normal mood and affect. His behavior is normal.  Nursing note and vitals reviewed.   ED Course  Procedures (including critical care time) Labs Review Labs Reviewed  COMPREHENSIVE METABOLIC PANEL - Abnormal; Notable for the following:    Sodium 131 (*)    Chloride 98 (*)    Glucose, Bld 123 (*)    All other components within normal limits  URINALYSIS, ROUTINE W REFLEX MICROSCOPIC (NOT AT [facility]) - Abnormal; Notable for the following:    Hgb urine dipstick SMALL (*)    All other components within normal limits  CBC WITH DIFFERENTIAL/PLATELET - Abnormal; Notable for the following:    WBC 12.5 (*)    Neutro Abs 9.2 (*)    Monocytes Absolute 1.3 (*)    All other components within normal limits  URINE CULTURE  URINE MICROSCOPIC-ADD ON  I-STAT CG4 LACTIC ACID, ED  I-STAT CG4 LACTIC ACID, ED    Imaging Review Dg Chest 2 View  05/13/2015   CLINICAL DATA:  Fever, headache, and dry cough for 3 days.  EXAM: CHEST  2 VIEW  COMPARISON:  10/20/2009  FINDINGS: AICD noted, lead positioning unchanged.  Cardiac and mediastinal margins appear normal. The lungs appear clear. No pleural effusion.  IMPRESSION: 1. No acute findings. 2. AICD  remains in place.   Electronically Signed   By: Van Clines M.D.   On: 05/13/2015 14:26   I have personally reviewed and evaluated these images and lab results as part of my medical decision-making.   EKG Interpretation None      MDM   Final diagnoses:  Fever, unspecified fever cause  Viral syndrome    Very well appearing 59 yo male with hx of CHF presenting with two days of fevers.  Have been controlled with acetaminophen.  Had a headache yesterday, but feels a lot better today.  He was seen at Urgent Care, but was referred to the ED because he has a history of heart failure.  His CHF appears to be under very good control.  He has no signs of bacterial infection.  Plan dc home with supportive treatment for what appears to be a mild viral syndrome.  Return  precautions given.   Serita Grit, MD 05/13/15 [phone]

## 2015-05-13 NOTE — ED Notes (Addendum)
PT states he had been having fevers (101.5) since Friday.  The fevers are alleviated with tylenol.  Denies sob, chest pain, abdominal pain, problems urinating.  Only c/o frontal headache, ear pain and dry cough.  Was seen at UC this am and tested neg for flu.

## 2015-05-14 LAB — URINE CULTURE: Culture: NO GROWTH

## 2015-05-23 ENCOUNTER — Encounter: Payer: Self-pay | Admitting: Internal Medicine

## 2015-07-03 ENCOUNTER — Encounter: Payer: Self-pay | Admitting: Internal Medicine

## 2015-07-03 ENCOUNTER — Ambulatory Visit (INDEPENDENT_AMBULATORY_CARE_PROVIDER_SITE_OTHER): Payer: 59 | Admitting: Internal Medicine

## 2015-07-03 VITALS — BP 116/68 | HR 71 | Ht 67.0 in | Wt 250.6 lb

## 2015-07-03 DIAGNOSIS — Z95810 Presence of automatic (implantable) cardiac defibrillator: Secondary | ICD-10-CM | POA: Diagnosis not present

## 2015-07-03 DIAGNOSIS — I5022 Chronic systolic (congestive) heart failure: Secondary | ICD-10-CM

## 2015-07-03 DIAGNOSIS — I447 Left bundle-branch block, unspecified: Secondary | ICD-10-CM | POA: Diagnosis not present

## 2015-07-03 DIAGNOSIS — I429 Cardiomyopathy, unspecified: Secondary | ICD-10-CM | POA: Diagnosis not present

## 2015-07-03 DIAGNOSIS — I428 Other cardiomyopathies: Secondary | ICD-10-CM

## 2015-07-03 LAB — CUP PACEART INCLINIC DEVICE CHECK
Battery Voltage: 2.61 V
Brady Statistic AP VP Percent: 21.79 %
Brady Statistic AP VS Percent: 0 %
Brady Statistic AS VP Percent: 78.21 %
Brady Statistic AS VS Percent: 0 %
Brady Statistic RA Percent Paced: 21.79 %
Brady Statistic RV Percent Paced: 100 %
Date Time Interrogation Session: 20161025170146
HighPow Impedance: 56 Ohm
HighPow Impedance: 86 Ohm
Implantable Lead Implant Date: 20050120
Implantable Lead Implant Date: 20050120
Implantable Lead Implant Date: 20100826
Implantable Lead Location: 753858
Implantable Lead Location: 753859
Implantable Lead Location: 753860
Implantable Lead Model: 4194
Implantable Lead Model: 5076
Implantable Lead Model: 5076
Lead Channel Impedance Value: 342 Ohm
Lead Channel Impedance Value: 437 Ohm
Lead Channel Impedance Value: 684 Ohm
Lead Channel Impedance Value: 741 Ohm
Lead Channel Impedance Value: 893 Ohm
Lead Channel Pacing Threshold Amplitude: 0.5 V
Lead Channel Pacing Threshold Amplitude: 0.75 V
Lead Channel Pacing Threshold Amplitude: 1 V
Lead Channel Pacing Threshold Pulse Width: 0.4 ms
Lead Channel Pacing Threshold Pulse Width: 0.4 ms
Lead Channel Pacing Threshold Pulse Width: 0.4 ms
Lead Channel Sensing Intrinsic Amplitude: 13.875 mV
Lead Channel Sensing Intrinsic Amplitude: 13.875 mV
Lead Channel Sensing Intrinsic Amplitude: 5.125 mV
Lead Channel Sensing Intrinsic Amplitude: 5.625 mV
Lead Channel Setting Pacing Amplitude: 2 V
Lead Channel Setting Pacing Amplitude: 2 V
Lead Channel Setting Pacing Amplitude: 2.5 V
Lead Channel Setting Pacing Pulse Width: 0.4 ms
Lead Channel Setting Pacing Pulse Width: 0.4 ms
Lead Channel Setting Sensing Sensitivity: 0.45 mV

## 2015-07-03 NOTE — Patient Instructions (Signed)
Medication Instructions: - no change  Labwork: - none  Procedures/Testing: - none  Follow-Up: - Remote monitoring is used to monitor your Pacemaker of ICD from home. This monitoring reduces the number of office visits required to check your device to one time per year. It allows [place] to keep an eye on the functioning of your device to ensure it is working properly. You are scheduled for a device check from home on 08/01/15 (battery check). You may send your transmission at any time that day. If you have a wireless device, the transmission will be sent automatically. After your physician reviews your transmission, you will receive a postcard with your next transmission date.  Any Additional Special Instructions Will Be Listed Below (If Applicable). - none

## 2015-07-03 NOTE — Progress Notes (Signed)
HPI  Jonathon Avila is a 59 y.o. male  seen in followup for a CRT-D. implanted for congestive heart failure in the setting of nonischemic heart disease. He is doing pretty well. He remains class II.   The patient denies chest pain, shortness of breath, nocturnal dyspnea, orthopnea or peripheral edema.  There have been no palpitations, lightheadedness or syncope.   Admitting the device is approaching ERI. He continues to struggle with weight. He snores terribly. He had a sleep study sometime ago. He does not remember how long ago. He has mild but not significant daytime fatigue.    [name]  Past Medical History  Diagnosis Date  . Hyperlipidemia   . LBBB (left bundle branch block)   . Nonischemic dilated cardiomyopathy ([place])   . Mitral insufficiency   . Pulmonary hypertension ([place])   . LV dysfunction   . Chronic systolic CHF (congestive heart failure) ([place])   . 2947 lead 06/10/2011    6949 rate sense portion was replaced with a Medtronic 5076-lead 2010     Past Surgical History  Procedure Laterality Date  . Cardiac catheterization  10/25/2001    EF 65%  . Cholecystectomy    . Icd      BIVENTRICULAR  . US echocardiography  02/19/2009    EF 45%    Current Outpatient Prescriptions  Medication Sig Dispense Refill  . aspirin EC 81 MG tablet Take 1 tablet (81 mg total) by mouth daily. 90 tablet 3  . carvedilol (COREG) 25 MG tablet TAKE ONE TABLET BY MOUTH TWICE DAILY WITH MEALS 180 tablet 3  . cholecalciferol (VITAMIN D) 1000 UNITS tablet Take 2,000 Units by mouth 2 (two) times daily.     . fluticasone (FLONASE) 50 MCG/ACT nasal spray Place 2 sprays into the nose daily.    . furosemide (LASIX) 40 MG tablet Take 1 tablet (40 mg total) by mouth as needed. 30 tablet 6  . lisinopril (PRINIVIL,ZESTRIL) 5 MG tablet Take 5 mg by mouth daily.    . Loratadine (CLARITIN PO) Take 1 tablet by mouth as needed.     . montelukast (SINGULAIR) 10 MG tablet Take 10 mg by mouth daily. Take 1 tab  daily at bedtime    . simvastatin (ZOCOR) 20 MG tablet Take 20 mg by mouth daily.    [name] spironolactone (ALDACTONE) 25 MG tablet TAKE ONE TABLET BY MOUTH AT BEDTIME 30 tablet 3   No current facility-administered medications for this visit.    Allergies  Allergen Reactions  . Codeine     Upset stomach   . Shellfish-Derived Products Nausea And Vomiting    Review of Systems negative except from HPI and PMH  Physical Exam BP 116/68 mmHg  Pulse 71  Ht 5\' 7"  (1.702 m)  Wt 250 lb 9.6 oz (113.671 kg)  BMI 39.24 kg/m2  Well developed and well nourished in no acute distress HENT normal E scleral and icterus clear Neck Supple JVP flat; carotids brisk and full Clear to ausculation Device pocket well healed; without hematoma or erythema  Regular rate and rhythm, no murmurs gallops or rub Soft with active bowel sounds No clubbing cyanosis and edema Alert and oriented, grossly normal motor and sensory function Skin Warm and Dry   Assessment and  Plan  Nonischemic cardiomyopathy  Sleep disordered breathing  6949-lead replaced with a 6546  Systolic heart failure chronic 2 Euvolemic continue current meds  Implantable defibrillator-CRT-Medtronic The patient's device was interrogated.  The information was reviewed. No changes were  made in the programming.     The device is approaching ERI. We have discussed the anticipated strategy which would be to use a antimicrobial pouch.  I would anticipate removing the scar tissue  His snoring is a significant problem. I've asked that he consider a repeat sleep study and discuss with Dr. Martinique and Jonathon Avila next months.

## 2015-08-01 ENCOUNTER — Ambulatory Visit (INDEPENDENT_AMBULATORY_CARE_PROVIDER_SITE_OTHER): Payer: Medicare Other | Admitting: *Deleted

## 2015-08-01 DIAGNOSIS — Z95810 Presence of automatic (implantable) cardiac defibrillator: Secondary | ICD-10-CM

## 2015-08-01 NOTE — Progress Notes (Signed)
Remote ICD transmission.   

## 2015-08-13 LAB — CUP PACEART REMOTE DEVICE CHECK
Brady Statistic AP VS Percent: 0 %
Brady Statistic AS VS Percent: 0 %
Brady Statistic RV Percent Paced: 100 %
HIGH POWER IMPEDANCE MEASURED VALUE: 85 Ohm
HighPow Impedance: 54 Ohm
Implantable Lead Implant Date: 20050120
Implantable Lead Implant Date: 20100826
Implantable Lead Location: 753859
Implantable Lead Location: 753860
Implantable Lead Model: 5076
Lead Channel Impedance Value: 323 Ohm
Lead Channel Impedance Value: 437 Ohm
Lead Channel Impedance Value: 646 Ohm
Lead Channel Pacing Threshold Amplitude: 0.875 V
Lead Channel Pacing Threshold Pulse Width: 0.4 ms
Lead Channel Sensing Intrinsic Amplitude: 5 mV
Lead Channel Sensing Intrinsic Amplitude: 5 mV
MDC IDC LEAD IMPLANT DT: 20050120
MDC IDC LEAD LOCATION: 753858
MDC IDC LEAD MODEL: 4194
MDC IDC MSMT BATTERY VOLTAGE: 2.61 V
MDC IDC MSMT LEADCHNL LV IMPEDANCE VALUE: 836 Ohm
MDC IDC MSMT LEADCHNL RV IMPEDANCE VALUE: 741 Ohm
MDC IDC MSMT LEADCHNL RV SENSING INTR AMPL: 13.875 mV
MDC IDC MSMT LEADCHNL RV SENSING INTR AMPL: 13.875 mV
MDC IDC SESS DTM: 20161123062307
MDC IDC SET LEADCHNL LV PACING AMPLITUDE: 2 V
MDC IDC SET LEADCHNL LV PACING PULSEWIDTH: 0.4 ms
MDC IDC SET LEADCHNL RA PACING AMPLITUDE: 2 V
MDC IDC SET LEADCHNL RV PACING AMPLITUDE: 2.5 V
MDC IDC SET LEADCHNL RV PACING PULSEWIDTH: 0.4 ms
MDC IDC SET LEADCHNL RV SENSING SENSITIVITY: 0.45 mV
MDC IDC STAT BRADY AP VP PERCENT: 19.33 %
MDC IDC STAT BRADY AS VP PERCENT: 80.67 %
MDC IDC STAT BRADY RA PERCENT PACED: 19.33 %

## 2015-08-14 ENCOUNTER — Encounter: Payer: Self-pay | Admitting: Cardiology

## 2015-08-30 ENCOUNTER — Telehealth: Payer: Self-pay | Admitting: Cardiology

## 2015-08-30 NOTE — Telephone Encounter (Signed)
Spoke w/ pt and informed him that his device reached has reached RRT. Informed pt that the alert tone he heard will go off for 16 days and then it will time out. Informed pt that a scheduler will call to schedule him an appt w/ MD / PA / NP. Pt verbalized understanding.

## 2015-08-30 NOTE — Telephone Encounter (Signed)
LMOVM for pt to return call. He reached RRT on 08-30-15

## 2015-09-04 ENCOUNTER — Ambulatory Visit (INDEPENDENT_AMBULATORY_CARE_PROVIDER_SITE_OTHER): Payer: Medicare Other | Admitting: *Deleted

## 2015-09-04 DIAGNOSIS — Z95810 Presence of automatic (implantable) cardiac defibrillator: Secondary | ICD-10-CM

## 2015-09-04 NOTE — Progress Notes (Signed)
Cardiology Office Note Date:  09/05/2015  Patient ID:  Jonathon Avila, Jonathon Avila [birth year], MRN [id number] PCP:  Leamon Arnt, MD  Cardiologist:  Dr. Martinique Electrophysiologist: Dr. Caryl Comes   Chief Complaint: ICD battery has reached ERI  History of Present Illness: Jonathon Avila is a 59 y.o. male with history of non-ischemic CM, LBBB, chronic systolic CHF, HLD was called to come in and arrange ICD battery change.  He comes today feeling well, he describes his exertional capacity as good, stable, describes himself as fairly active, no CP, palpitations, no rest SOB, will get mildly winded with some activities, but able to do his daily activities.  No dizziness, near syncope or syncope.  No shocks from his device.  He has not wanted to move forward with a sleep study as of yet.  He reports his labs/lipids are monitored with his PMD.   Past Medical History  Diagnosis Date  . Hyperlipidemia   . LBBB (left bundle branch block)   . Nonischemic dilated cardiomyopathy ([place])   . Mitral insufficiency   . Pulmonary hypertension ([place])   . LV dysfunction   . Chronic systolic CHF (congestive heart failure) ([place])   . [id number] lead 06/10/2011    6949 rate sense portion was replaced with a Medtronic 5076-lead 2010     Past Surgical History  Procedure Laterality Date  . Cardiac catheterization  10/25/2001    EF 65%  . Cholecystectomy    . Icd      BIVENTRICULAR  . US echocardiography  02/19/2009    EF 45%     Allergies:   Codeine and Shellfish-derived products   Social History:  The patient  reports that he has never smoked. He does not have any smokeless tobacco history on file. He reports that he does not drink alcohol or use illicit drugs.   Family History:  The patient's family history includes COPD in his mother; Colon cancer in his father.  ROS:  Please see the history of present illness.    All other systems are reviewed and otherwise negative.   PHYSICAL EXAM:  VS:  BP  116/66 mmHg  Pulse 75  Ht 5\' 7"  (1.702 m)  Wt 253 lb (114.76 kg)  BMI 39.62 kg/m2 BMI: Body mass index is 39.62 kg/(m^2). Plesant, obese WM in no acute distress HEENT: normocephalic, atraumatic Neck: no JVD, carotid bruits or masses Cardiac:  normal S1, S2; RRR; no significant murmurs, no rubs, or gallops Lungs:  clear to auscultation bilaterally, no wheezing, rhonchi or rales Abd: soft, nontender,  MS: no deformity or atrophy Ext: no edema Skin: warm and dry, no rash Neuro:  No gross deficits appreciated Psych: euthymic mood, full affect  ICD site is stable, no tethering or discomfort  06/28/13: Echocardiogram Study Conclusions - Left ventricle: The cavity size was mildly dilated. Wall thickness was normal. Systolic function was mildly reduced. The estimated ejection fraction was in the range of 45% to 50%. Diffuse hypokinesis. Doppler parameters are consistent with abnormal left ventricular relaxation (grade 1 diastolic dysfunction). - Left atrium: The atrium was mildly dilated. - Right ventricle: The cavity size was mildly dilated. - Right atrium: The atrium was mildly dilated.  EKG:  Done today shows AV pacing  Recent Labs: 05/13/2015: ALT 23; BUN 9; Creatinine, Ser 0.82; Hemoglobin 14.2; Platelets 169; Potassium 4.2; Sodium 131*     Wt Readings from Last 3 Encounters:  09/05/15 253 lb (114.76 kg)  07/03/15 250 lb 9.6 oz (113.671 kg)  05/13/15  250 lb (113.399 kg)     Other studies reviewed: Additional studies/records reviewed today include: summarized above  DEVICE information: MDT ICD and [id number] lead replaced with MDT 5076 lead on 05/03/09, original device ICD Sep 28, 2003 (Dr. Lovena Le)  ASSESSMENT AND PLAN:  1. Non-ischemic CM Last EF in 2014 was 45-50% ICD battery has reached ERI, plan for generator change Dr. Caryl Comes last note mentions as well using an antimocrobial pouch and scar tissue removal, will schedule accordingly Resume Carelink remotes post  generator change  2. Chronic systolic CHF Appears well compensated On BB/ACE-I, aldactone  Disposition: F/u with wound check post procedure and 3 month with Dr. Caryl Comes, sooner if needed.  Current medicines are reviewed at length with the patient today.  The patient did not have any concerns regarding medicines.  Jonathon Lasso, PA-C 09/05/2015 8:36 AM     [address] [place] [id number] [phone] (office)  [phone] (fax)

## 2015-09-05 ENCOUNTER — Ambulatory Visit (INDEPENDENT_AMBULATORY_CARE_PROVIDER_SITE_OTHER): Payer: 59 | Admitting: Physician Assistant

## 2015-09-05 ENCOUNTER — Encounter: Payer: Self-pay | Admitting: Physician Assistant

## 2015-09-05 ENCOUNTER — Encounter: Payer: Self-pay | Admitting: *Deleted

## 2015-09-05 VITALS — BP 116/66 | HR 75 | Ht 67.0 in | Wt 253.0 lb

## 2015-09-05 DIAGNOSIS — I429 Cardiomyopathy, unspecified: Secondary | ICD-10-CM

## 2015-09-05 DIAGNOSIS — I5022 Chronic systolic (congestive) heart failure: Secondary | ICD-10-CM | POA: Diagnosis not present

## 2015-09-05 DIAGNOSIS — I428 Other cardiomyopathies: Secondary | ICD-10-CM

## 2015-09-05 LAB — CUP PACEART REMOTE DEVICE CHECK
Brady Statistic AS VS Percent: 0 %
Brady Statistic RA Percent Paced: 22.67 %
Brady Statistic RV Percent Paced: 100 %
Date Time Interrogation Session: 20161227051809
HIGH POWER IMPEDANCE MEASURED VALUE: 82 Ohm
HighPow Impedance: 54 Ohm
Implantable Lead Implant Date: 20050120
Implantable Lead Location: 753858
Implantable Lead Location: 753859
Implantable Lead Location: 753860
Implantable Lead Model: 4194
Lead Channel Impedance Value: 475 Ohm
Lead Channel Impedance Value: 741 Ohm
Lead Channel Pacing Threshold Amplitude: 0.875 V
Lead Channel Pacing Threshold Pulse Width: 0.4 ms
Lead Channel Sensing Intrinsic Amplitude: 13.875 mV
Lead Channel Sensing Intrinsic Amplitude: 4.5 mV
Lead Channel Setting Pacing Amplitude: 2 V
Lead Channel Setting Pacing Pulse Width: 0.4 ms
Lead Channel Setting Sensing Sensitivity: 0.45 mV
MDC IDC LEAD IMPLANT DT: 20050120
MDC IDC LEAD IMPLANT DT: 20100826
MDC IDC MSMT BATTERY VOLTAGE: 2.61 V
MDC IDC MSMT LEADCHNL LV IMPEDANCE VALUE: 342 Ohm
MDC IDC MSMT LEADCHNL LV IMPEDANCE VALUE: 646 Ohm
MDC IDC MSMT LEADCHNL LV IMPEDANCE VALUE: 855 Ohm
MDC IDC MSMT LEADCHNL RA SENSING INTR AMPL: 4.5 mV
MDC IDC MSMT LEADCHNL RV SENSING INTR AMPL: 13.875 mV
MDC IDC SET LEADCHNL LV PACING AMPLITUDE: 2 V
MDC IDC SET LEADCHNL RV PACING AMPLITUDE: 2.5 V
MDC IDC SET LEADCHNL RV PACING PULSEWIDTH: 0.4 ms
MDC IDC STAT BRADY AP VP PERCENT: 22.67 %
MDC IDC STAT BRADY AP VS PERCENT: 0 %
MDC IDC STAT BRADY AS VP PERCENT: 77.32 %

## 2015-09-05 NOTE — Patient Instructions (Addendum)
Medication Instructions:   Your physician recommends that you continue on your current medications as directed. Please refer to the Current Medication list given to you today.  If you need a refill on your cardiac medications before your next appointment, please call your pharmacy.  Labwork: RETURN ON 09/11/15 FOR CBC AND BMET    Testing/Procedures: SEE LETTER FOR ICD GEN CHANGEOUT   Follow-Up:  10 DAYS AFTER 09/24/15 WOUND CHECK   91 DAYS AFTER 01/07/16 WITH DR Caryl Comes PHYS DEFIB CK   Any Other Special Instructions Will Be Listed Below (If Applicable).

## 2015-09-05 NOTE — Progress Notes (Signed)
Remote ICD transmission.   

## 2015-09-06 ENCOUNTER — Other Ambulatory Visit: Payer: Self-pay | Admitting: Cardiology

## 2015-09-07 NOTE — Telephone Encounter (Signed)
Rx request sent to pharmacy.  

## 2015-09-10 ENCOUNTER — Other Ambulatory Visit: Payer: Self-pay | Admitting: Physician Assistant

## 2015-09-11 ENCOUNTER — Other Ambulatory Visit: Payer: Self-pay | Admitting: Physician Assistant

## 2015-09-11 ENCOUNTER — Other Ambulatory Visit (INDEPENDENT_AMBULATORY_CARE_PROVIDER_SITE_OTHER): Payer: 59 | Admitting: *Deleted

## 2015-09-11 DIAGNOSIS — I5022 Chronic systolic (congestive) heart failure: Secondary | ICD-10-CM

## 2015-09-11 LAB — CBC
HEMATOCRIT: 40.9 % (ref 39.0–52.0)
Hemoglobin: 13.6 g/dL (ref 13.0–17.0)
MCH: 27.4 pg (ref 26.0–34.0)
MCHC: 33.3 g/dL (ref 30.0–36.0)
MCV: 82.3 fL (ref 78.0–100.0)
MPV: 9.8 fL (ref 8.6–12.4)
Platelets: 205 10*3/uL (ref 150–400)
RBC: 4.97 MIL/uL (ref 4.22–5.81)
RDW: 15.2 % (ref 11.5–15.5)
WBC: 10 10*3/uL (ref 4.0–10.5)

## 2015-09-11 LAB — BASIC METABOLIC PANEL
BUN: 12 mg/dL (ref 7–25)
CHLORIDE: 100 mmol/L (ref 98–110)
CO2: 26 mmol/L (ref 20–31)
Calcium: 9.3 mg/dL (ref 8.6–10.3)
Creat: 0.79 mg/dL (ref 0.70–1.33)
GLUCOSE: 121 mg/dL — AB (ref 65–99)
POTASSIUM: 4.2 mmol/L (ref 3.5–5.3)
Sodium: 137 mmol/L (ref 135–146)

## 2015-09-13 ENCOUNTER — Telehealth: Payer: Self-pay | Admitting: Physician Assistant

## 2015-09-13 ENCOUNTER — Telehealth: Payer: Self-pay | Admitting: *Deleted

## 2015-09-13 NOTE — Telephone Encounter (Signed)
New message ° ° ° ° °Returning a call to the nurse to get lab results °

## 2015-09-13 NOTE — Telephone Encounter (Signed)
-----   Message from [facility] [place] sent at 09/13/2015  6:41 AM EST ----- Please let him know his labs look good for his procedure.  Thanks [employer]

## 2015-09-14 ENCOUNTER — Encounter (HOSPITAL_COMMUNITY): Payer: Self-pay | Admitting: Internal Medicine

## 2015-09-14 ENCOUNTER — Encounter (HOSPITAL_COMMUNITY)
Admission: RE | Disposition: A | Discharge status: Home / Self Care | Payer: 59 | Source: Ambulatory Visit | Attending: Internal Medicine

## 2015-09-14 ENCOUNTER — Ambulatory Visit (HOSPITAL_COMMUNITY)
Admission: RE | Admit: 2015-09-14 | Discharge: 2015-09-14 | Disposition: A | Discharge status: Home / Self Care | Payer: 59 | Source: Ambulatory Visit | Attending: Internal Medicine | Admitting: Internal Medicine

## 2015-09-14 DIAGNOSIS — I340 Nonrheumatic mitral (valve) insufficiency: Secondary | ICD-10-CM | POA: Insufficient documentation

## 2015-09-14 DIAGNOSIS — I272 Other secondary pulmonary hypertension: Secondary | ICD-10-CM | POA: Insufficient documentation

## 2015-09-14 DIAGNOSIS — I429 Cardiomyopathy, unspecified: Secondary | ICD-10-CM | POA: Diagnosis not present

## 2015-09-14 DIAGNOSIS — E785 Hyperlipidemia, unspecified: Secondary | ICD-10-CM | POA: Insufficient documentation

## 2015-09-14 DIAGNOSIS — I420 Dilated cardiomyopathy: Secondary | ICD-10-CM | POA: Diagnosis not present

## 2015-09-14 DIAGNOSIS — Z4502 Encounter for adjustment and management of automatic implantable cardiac defibrillator: Secondary | ICD-10-CM | POA: Diagnosis present

## 2015-09-14 DIAGNOSIS — I447 Left bundle-branch block, unspecified: Secondary | ICD-10-CM | POA: Diagnosis not present

## 2015-09-14 DIAGNOSIS — Z95810 Presence of automatic (implantable) cardiac defibrillator: Secondary | ICD-10-CM | POA: Diagnosis present

## 2015-09-14 DIAGNOSIS — I5022 Chronic systolic (congestive) heart failure: Secondary | ICD-10-CM | POA: Diagnosis not present

## 2015-09-14 DIAGNOSIS — I428 Other cardiomyopathies: Secondary | ICD-10-CM

## 2015-09-14 HISTORY — PX: EP IMPLANTABLE DEVICE: SHX172B

## 2015-09-14 LAB — SURGICAL PCR SCREEN
MRSA, PCR: NEGATIVE
Staphylococcus aureus: NEGATIVE

## 2015-09-14 SURGERY — ICD/BIV ICD GENERATOR CHANGEOUT

## 2015-09-14 MED ORDER — FENTANYL CITRATE (PF) 100 MCG/2ML IJ SOLN
INTRAMUSCULAR | Status: AC
  Filled 2015-09-14: qty 2

## 2015-09-14 MED ORDER — MIDAZOLAM HCL 5 MG/5ML IJ SOLN
INTRAMUSCULAR | Status: AC
  Filled 2015-09-14: qty 5

## 2015-09-14 MED ORDER — SODIUM CHLORIDE 0.9 % IR SOLN
80.0000 mg | Status: AC
  Administered 2015-09-14: 80 mg

## 2015-09-14 MED ORDER — LIDOCAINE HCL (PF) 1 % IJ SOLN
INTRAMUSCULAR | Status: DC | PRN
  Administered 2015-09-14: 44 mL

## 2015-09-14 MED ORDER — MUPIROCIN 2 % EX OINT
1.0000 "application " | TOPICAL_OINTMENT | Freq: Once | CUTANEOUS | Status: AC
  Administered 2015-09-14: 1 via TOPICAL

## 2015-09-14 MED ORDER — CEFAZOLIN SODIUM-DEXTROSE 2-3 GM-% IV SOLR
2.0000 g | INTRAVENOUS | Status: AC
  Administered 2015-09-14: 2 g via INTRAVENOUS

## 2015-09-14 MED ORDER — CHLORHEXIDINE GLUCONATE 4 % EX LIQD: 60.0000 mL | Freq: Once | CUTANEOUS | Status: DC

## 2015-09-14 MED ORDER — SODIUM CHLORIDE 0.9 % IV SOLN
INTRAVENOUS | Status: DC
  Administered 2015-09-14: 07:00:00 via INTRAVENOUS

## 2015-09-14 MED ORDER — LIDOCAINE HCL (PF) 1 % IJ SOLN
INTRAMUSCULAR | Status: AC
  Filled 2015-09-14: qty 60

## 2015-09-14 MED ORDER — SODIUM CHLORIDE 0.9 % IR SOLN
Status: AC
  Filled 2015-09-14: qty 2

## 2015-09-14 MED ORDER — CEFAZOLIN SODIUM-DEXTROSE 2-3 GM-% IV SOLR
INTRAVENOUS | Status: AC
  Filled 2015-09-14: qty 50

## 2015-09-14 MED ORDER — MUPIROCIN 2 % EX OINT
TOPICAL_OINTMENT | CUTANEOUS | Status: AC
Start: 2015-09-14 — End: 2015-09-14
  Administered 2015-09-14: 1 via TOPICAL
  Filled 2015-09-14: qty 22

## 2015-09-14 MED ORDER — MIDAZOLAM HCL 5 MG/5ML IJ SOLN
INTRAMUSCULAR | Status: DC | PRN
  Administered 2015-09-14 (×3): 1 mg via INTRAVENOUS
  Administered 2015-09-14: 3 mg via INTRAVENOUS

## 2015-09-14 MED ORDER — FENTANYL CITRATE (PF) 100 MCG/2ML IJ SOLN
INTRAMUSCULAR | Status: DC | PRN
  Administered 2015-09-14 (×3): 25 ug via INTRAVENOUS
  Administered 2015-09-14: 50 ug via INTRAVENOUS

## 2015-09-14 SURGICAL SUPPLY — 9 items
CABLE SURGICAL S-101-97-12 (CABLE) ×2 IMPLANT
DEVICE DISSECT PLASMABLAD 3.0S (MISCELLANEOUS) IMPLANT
HEMOSTAT SURGICEL 2X4 FIBR (HEMOSTASIS) ×2 IMPLANT
ICD VIVA XT CRT-D DTBA1D1 (ICD Generator) ×2 IMPLANT
PAD DEFIB LIFELINK (PAD) ×2 IMPLANT
PLASMABLADE 3.0S (MISCELLANEOUS) ×3
POUCH AIGIS-R ANTIBACT ICD (Mesh General) ×3 IMPLANT
POUCH AIGIS-R ANTIBACT ICD LRG (Mesh General) IMPLANT
TRAY PACEMAKER INSERTION (PACKS) ×2 IMPLANT

## 2015-09-14 NOTE — Interval H&P Note (Signed)
ICD Criteria  Current LVEF:45%. Within 12 months prior to implant: No   Heart failure history: Yes, Class II  Cardiomyopathy history: Yes, Non-Ischemic Cardiomyopathy.  Atrial Fibrillation/Atrial Flutter: No.      Medication List    ASK your doctor about these medications        aspirin EC 81 MG tablet  Take 1 tablet (81 mg total) by mouth daily.     carvedilol 25 MG tablet  Commonly known as:  COREG  TAKE ONE TABLET BY MOUTH TWICE DAILY WITH MEALS     cholecalciferol 1000 units tablet  Commonly known as:  VITAMIN D  Take 2,000 Units by mouth 2 (two) times daily.     CLARITIN PO  Take 1 tablet by mouth daily as needed. For seasonal allergies     fluticasone 50 MCG/ACT nasal spray  Commonly known as:  FLONASE  Place 2 sprays into the nose daily.     furosemide 40 MG tablet  Commonly known as:  LASIX  Take 1 tablet (40 mg total) by mouth as needed.     lisinopril 5 MG tablet  Commonly known as:  PRINIVIL,ZESTRIL  Take 5 mg by mouth daily.     montelukast 10 MG tablet  Commonly known as:  SINGULAIR  Take 10 mg by mouth daily. Take 1 tab daily at bedtime     simvastatin 20 MG tablet  Commonly known as:  ZOCOR  Take 20 mg by mouth daily.     spironolactone 25 MG tablet  Commonly known as:  ALDACTONE  TAKE ONE TABLET BY MOUTH AT BEDTIME        Ventricular tachycardia history: Yes, Hemodynamic instability present. VT Type: Sustained Ventricular Tachycardia - Monomorphic.  Cardiac arrest history: No.  History of syndromes with risk of sudden death: No.  Previous ICD: Yes, Reason for ICD:  Primary prevention.  Current ICD indication: Primary  PPM indication: Yes. Pacing type: Both. Greater than 40% RV pacing requirement anticipated. Indication: CRT    Class I or II Bradycardia indication present: No  Beta Blocker therapy for 3 or more months: Yes, prescribed.   Ace Inhibitor/ARB therapy for 3 or more months: Yes, prescribed.   History and Physical  Interval Note:  09/14/2015 7:15 AM  Jonathon Avila  has presented today for surgery, with the diagnosis of cm  The various methods of treatment have been discussed with the patient and family. After consideration of risks, benefits and other options for treatment, the patient has consented to  Procedure(s):  ICD [employer] (N/A) as a surgical intervention .  The patient's history has been reviewed, patient examined, no change in status, stable for surgery.  I have reviewed the patient's chart and labs.  Questions were answered to the patient's satisfaction.     Virl Axe

## 2015-09-14 NOTE — H&P (View-Only) (Signed)
Cardiology Office Note Date:  09/05/2015  Patient ID:  Laquintin, Atkison 1955-11-10, MRN [id number] PCP:  Leamon Arnt, MD  Cardiologist:  Dr. Martinique Electrophysiologist: Dr. Caryl Comes   Chief Complaint: ICD battery has reached ERI  History of Present Illness: AMYR WASZAK is a 60 y.o. male with history of non-ischemic CM, LBBB, chronic systolic CHF, HLD was called to come in and arrange ICD battery change.  He comes today feeling well, he describes his exertional capacity as good, stable, describes himself as fairly active, no CP, palpitations, no rest SOB, will get mildly winded with some activities, but able to do his daily activities.  No dizziness, near syncope or syncope.  No shocks from his device.  He has not wanted to move forward with a sleep study as of yet.  He reports his labs/lipids are monitored with his PMD.   Past Medical History  Diagnosis Date  . Hyperlipidemia   . LBBB (left bundle branch block)   . Nonischemic dilated cardiomyopathy ([place])   . Mitral insufficiency   . Pulmonary hypertension ([place])   . LV dysfunction   . Chronic systolic CHF (congestive heart failure) ([place])   . [id number] lead 06/10/2011    6949 rate sense portion was replaced with a Medtronic 5076-lead 2010     Past Surgical History  Procedure Laterality Date  . Cardiac catheterization  10/25/2001    EF 65%  . Cholecystectomy    . Icd      BIVENTRICULAR  . US echocardiography  02/19/2009    EF 45%     Allergies:   Codeine and Shellfish-derived products   Social History:  The patient  reports that he has never smoked. He does not have any smokeless tobacco history on file. He reports that he does not drink alcohol or use illicit drugs.   Family History:  The patient's family history includes COPD in his mother; Colon cancer in his father.  ROS:  Please see the history of present illness.    All other systems are reviewed and otherwise negative.   PHYSICAL EXAM:  VS:  BP  116/66 mmHg  Pulse 75  Ht 5\' 7"  (1.702 m)  Wt 253 lb (114.76 kg)  BMI 39.62 kg/m2 BMI: Body mass index is 39.62 kg/(m^2). Plesant, obese WM in no acute distress HEENT: normocephalic, atraumatic Neck: no JVD, carotid bruits or masses Cardiac:  normal S1, S2; RRR; no significant murmurs, no rubs, or gallops Lungs:  clear to auscultation bilaterally, no wheezing, rhonchi or rales Abd: soft, nontender,  MS: no deformity or atrophy Ext: no edema Skin: warm and dry, no rash Neuro:  No gross deficits appreciated Psych: euthymic mood, full affect  ICD site is stable, no tethering or discomfort  06/28/13: Echocardiogram Study Conclusions - Left ventricle: The cavity size was mildly dilated. Wall thickness was normal. Systolic function was mildly reduced. The estimated ejection fraction was in the range of 45% to 50%. Diffuse hypokinesis. Doppler parameters are consistent with abnormal left ventricular relaxation (grade 1 diastolic dysfunction). - Left atrium: The atrium was mildly dilated. - Right ventricle: The cavity size was mildly dilated. - Right atrium: The atrium was mildly dilated.  EKG:  Done today shows AV pacing  Recent Labs: 05/13/2015: ALT 23; BUN 9; Creatinine, Ser 0.82; Hemoglobin 14.2; Platelets 169; Potassium 4.2; Sodium 131*     Wt Readings from Last 3 Encounters:  09/05/15 253 lb (114.76 kg)  07/03/15 250 lb 9.6 oz (113.671 kg)  05/13/15  250 lb (113.399 kg)     Other studies reviewed: Additional studies/records reviewed today include: summarized above  DEVICE information: MDT ICD and [phone] lead replaced with MDT 5076 lead on 05/03/09, original device ICD Sep 28, 2003 (Dr. Lovena Le)  ASSESSMENT AND PLAN:  1. Non-ischemic CM Last EF in 2014 was 45-50% ICD battery has reached ERI, plan for generator change Dr. Caryl Comes last note mentions as well using an antimocrobial pouch and scar tissue removal, will schedule accordingly Resume Carelink remotes post  generator change  2. Chronic systolic CHF Appears well compensated On BB/ACE-I, aldactone  Disposition: F/u with wound check post procedure and 3 month with Dr. Caryl Comes, sooner if needed.  Current medicines are reviewed at length with the patient today.  The patient did not have any concerns regarding medicines.  Haywood Lasso, PA-C 09/05/2015 8:36 AM     [place] [address] [phone] (office)  [phone] (fax)

## 2015-09-14 NOTE — Discharge Instructions (Signed)
Pacemaker Battery Change, Care After Refer to this sheet in the next few weeks. These instructions provide you with information on caring for yourself after your procedure. Your health care provider may also give you more specific instructions. Your treatment has been planned according to current medical practices, but problems sometimes occur. Call your health care provider if you have any problems or questions after your procedure. WHAT TO EXPECT AFTER THE PROCEDURE After your procedure, it is typical to have the following sensations:  Soreness at the pacemaker site. HOME CARE INSTRUCTIONS   Keep the incision clean and dry.  For the first week after the replacement, avoid stretching motions that pull at the incision site, and avoid heavy exercise with the arm that is on the same side as the incision.  Take medicines only as directed by your health care provider.  Keep all follow-up visits as directed by your health care provider. SEEK MEDICAL CARE IF:   You have pain at the incision site that is not relieved by over-the-counter or prescription medicine.  There is drainage or pus from the incision site.  There is swelling larger than a lime at the incision site.  You develop red streaking that extends above or below the incision site.  You feel brief, intermittent palpitations, light-headedness, or any symptoms that you feel might be related to your heart. SEEK IMMEDIATE MEDICAL CARE IF:   You experience chest pain that is different than the pain at the pacemaker site.  You experience shortness of breath.  You have palpitations or irregular heartbeat.  You have light-headedness that does not go away quickly.  You faint.  You have pain that gets worse and is not relieved by medicine.   This information is not intended to replace advice given to you by your health care provider. Make sure you discuss any questions you have with your health care provider.   Document Released:  06/15/2013 Document Revised: 09/15/2014 Document Reviewed: 06/15/2013 Elsevier Interactive Patient Education [employer].

## 2015-09-17 ENCOUNTER — Telehealth: Payer: Self-pay | Admitting: Internal Medicine

## 2015-09-17 NOTE — Telephone Encounter (Signed)
Patient informed that big bandage may be removed today. Patient voiced understanding.  I also informed patient about wound check appt for 1/16.

## 2015-09-17 NOTE — Telephone Encounter (Signed)
New Message  Pt req a call back to discuss when he can remove the bandages. Please call back to disucss

## 2015-09-17 NOTE — Telephone Encounter (Signed)
New message       Wife thinks pt is to come in tomorrow or wed for a follow up.  The computer says next Monday.  Please call.  Also, pt want to know when can he take the bandage off?

## 2015-09-18 ENCOUNTER — Ambulatory Visit (INDEPENDENT_AMBULATORY_CARE_PROVIDER_SITE_OTHER): Payer: 59 | Admitting: *Deleted

## 2015-09-18 DIAGNOSIS — I428 Other cardiomyopathies: Secondary | ICD-10-CM

## 2015-09-18 DIAGNOSIS — I429 Cardiomyopathy, unspecified: Secondary | ICD-10-CM

## 2015-09-18 MED FILL — Sodium Chloride Irrigation Soln 0.9%: Qty: 500 | Status: AC

## 2015-09-18 MED FILL — Gentamicin Sulfate Inj 40 MG/ML: INTRAMUSCULAR | Qty: 2 | Status: AC

## 2015-09-18 NOTE — Telephone Encounter (Signed)
Follow up     Patient calling has question regarding dressing changes

## 2015-09-18 NOTE — Telephone Encounter (Signed)
Spoke to wife about patient's wound. Wife states that patient removed the pressure dressing yesterday, but thought that the pt was supposed to come into the office today for a wound check. Appt made for today at 1600.

## 2015-09-20 NOTE — Progress Notes (Signed)
Patient presents to the office for wound check s/p CRT gen change on 09/14/15. Patient removed pressure dressing today at 1200. Hematoma is present but is soft to palpation and ecchymosis is now green/yellow near L axilla. Steri strips remain clean, dry, and intact over incision.  Discussed findings with Dr.Klein who recommended for pt to f/u for wound check as scheduled. I asked patient to call if his hematoma becomes larger. Patient voiced understanding.

## 2015-09-24 ENCOUNTER — Ambulatory Visit (INDEPENDENT_AMBULATORY_CARE_PROVIDER_SITE_OTHER): Payer: 59 | Admitting: *Deleted

## 2015-09-24 ENCOUNTER — Encounter: Payer: Self-pay | Admitting: Internal Medicine

## 2015-09-24 DIAGNOSIS — I429 Cardiomyopathy, unspecified: Secondary | ICD-10-CM | POA: Diagnosis not present

## 2015-09-24 DIAGNOSIS — I428 Other cardiomyopathies: Secondary | ICD-10-CM

## 2015-09-24 LAB — CUP PACEART INCLINIC DEVICE CHECK
Battery Remaining Longevity: 107 mo
Battery Voltage: 3.1 V
Brady Statistic AS VS Percent: 0.03 %
Brady Statistic RV Percent Paced: 99.95 %
Date Time Interrogation Session: 20170116103902
HighPow Impedance: 49 Ohm
HighPow Impedance: 83 Ohm
Implantable Lead Implant Date: 20050120
Implantable Lead Implant Date: 20050120
Implantable Lead Location: 753859
Implantable Lead Model: 5076
Implantable Lead Model: 5076
Lead Channel Impedance Value: 399 Ohm
Lead Channel Impedance Value: 551 Ohm
Lead Channel Pacing Threshold Amplitude: 0.5 V
Lead Channel Pacing Threshold Amplitude: 1.125 V
Lead Channel Pacing Threshold Pulse Width: 0.4 ms
Lead Channel Sensing Intrinsic Amplitude: 4.125 mV
Lead Channel Sensing Intrinsic Amplitude: 5.125 mV
MDC IDC LEAD IMPLANT DT: 20100826
MDC IDC LEAD LOCATION: 753858
MDC IDC LEAD LOCATION: 753860
MDC IDC LEAD MODEL: 4194
MDC IDC MSMT LEADCHNL LV IMPEDANCE VALUE: 304 Ohm
MDC IDC MSMT LEADCHNL LV IMPEDANCE VALUE: 646 Ohm
MDC IDC MSMT LEADCHNL LV IMPEDANCE VALUE: 874 Ohm
MDC IDC MSMT LEADCHNL LV PACING THRESHOLD PULSEWIDTH: 0.4 ms
MDC IDC MSMT LEADCHNL RA PACING THRESHOLD AMPLITUDE: 0.625 V
MDC IDC MSMT LEADCHNL RA PACING THRESHOLD PULSEWIDTH: 0.4 ms
MDC IDC MSMT LEADCHNL RV IMPEDANCE VALUE: 703 Ohm
MDC IDC MSMT LEADCHNL RV SENSING INTR AMPL: 13.75 mV
MDC IDC MSMT LEADCHNL RV SENSING INTR AMPL: 14.25 mV
MDC IDC SET LEADCHNL LV PACING AMPLITUDE: 2.25 V
MDC IDC SET LEADCHNL LV PACING PULSEWIDTH: 0.4 ms
MDC IDC SET LEADCHNL RA PACING AMPLITUDE: 1.5 V
MDC IDC SET LEADCHNL RV PACING AMPLITUDE: 2 V
MDC IDC SET LEADCHNL RV PACING PULSEWIDTH: 0.4 ms
MDC IDC SET LEADCHNL RV SENSING SENSITIVITY: 0.45 mV
MDC IDC STAT BRADY AP VP PERCENT: 5.66 %
MDC IDC STAT BRADY AP VS PERCENT: 0.01 %
MDC IDC STAT BRADY AS VP PERCENT: 94.3 %
MDC IDC STAT BRADY RA PERCENT PACED: 5.67 %

## 2015-09-24 NOTE — Progress Notes (Signed)
Wound check appointment s/p gen change. Steri-strips removed. Wound without redness. Incision edges approximated, wound well healed. Hematoma still remains, but appears smaller in size and is soft w/palpation--no ecchymosis noted, patient states that it is now easier to move his L arm. Patient encouraged to call if he notices that the hematoma increases in size.  CRT-D checked in office. Normal device function. Thresholds, sensing, and impedances consistent with implant measurements. Device programmed at appropriate safety margins. Histogram distribution appropriate for patient and level of activity. No mode switches or ventricular arrhythmias noted. Patient educated about wound care, arm mobility, and shock plan. ROV in 3 months with SK.

## 2015-09-27 ENCOUNTER — Telehealth: Payer: Self-pay | Admitting: Internal Medicine

## 2015-09-27 NOTE — Telephone Encounter (Signed)
Faxed form to [employer] at [phone]. Confirmation received.

## 2015-09-27 NOTE — Telephone Encounter (Signed)
New MEssage  Pt calling to followup w/ RN concerning paperwork to be filled and faxed by Dr Caryl Comes by 1/20. Please call back and discuss.

## 2015-09-27 NOTE — Telephone Encounter (Signed)
I left a message that I am faxing the paperwork ([place]) tonight.

## 2015-10-02 ENCOUNTER — Telehealth: Payer: Self-pay | Admitting: Internal Medicine

## 2015-10-02 NOTE — Telephone Encounter (Signed)
Nothing in Klein's box.  Checked with medical records and they had no fax either.

## 2015-10-02 NOTE — Telephone Encounter (Signed)
New Message  Paperwork filled out by Dr Caryl Comes last week- was the wrong form- correct forms to be filled out- per pt- was faxed to [facility] [address] last night 10/01/15. Please call back and discuss.

## 2015-10-02 NOTE — Telephone Encounter (Signed)
Sherri,  Can you see if any forms have been placed in Dr. Olin Pia box or see if you can locate this fax please. Thanks!

## 2015-10-04 ENCOUNTER — Ambulatory Visit: Payer: Medicare Other

## 2015-10-04 NOTE — Telephone Encounter (Signed)
I called and spoke with the patient. I advised him no paper work has been received from [place]. Confirmed with medical records that they have not seen anything. I gave the patient our fax # [phone] so that his wife may give this to [place]. Will await follow up paper work.

## 2015-10-04 NOTE — Telephone Encounter (Signed)
Form found on Dr. Olin Pia cart underneath some paperwork. The question Christella Scheuermann is asking is about "continuous leave." Dr. Caryl Comes attempted to call Christella Scheuermann and left a message for Michelin Hegland [phone] to call and clarify what this means as the patient's wife was only out a few days.  I called the patient back to notify him I found the paperwork and that we will try to complete tomorrow after a return call from [place].

## 2015-10-04 NOTE — Telephone Encounter (Signed)
Follow up     Calling to check on the status on the FMLA forms faxed to our office.  Wife was out of work when pt had defib procedure.  Please call and let him know

## 2015-10-08 ENCOUNTER — Encounter: Payer: Self-pay | Admitting: Cardiology

## 2015-10-08 ENCOUNTER — Ambulatory Visit (INDEPENDENT_AMBULATORY_CARE_PROVIDER_SITE_OTHER): Payer: 59 | Admitting: Cardiology

## 2015-10-08 VITALS — BP 120/70 | HR 84 | Ht 67.0 in | Wt 248.0 lb

## 2015-10-08 DIAGNOSIS — I447 Left bundle-branch block, unspecified: Secondary | ICD-10-CM | POA: Diagnosis not present

## 2015-10-08 DIAGNOSIS — I429 Cardiomyopathy, unspecified: Secondary | ICD-10-CM | POA: Diagnosis not present

## 2015-10-08 DIAGNOSIS — E785 Hyperlipidemia, unspecified: Secondary | ICD-10-CM

## 2015-10-08 DIAGNOSIS — I428 Other cardiomyopathies: Secondary | ICD-10-CM

## 2015-10-08 DIAGNOSIS — I5022 Chronic systolic (congestive) heart failure: Secondary | ICD-10-CM | POA: Diagnosis not present

## 2015-10-08 NOTE — Patient Instructions (Signed)
Continue your current therapy  I will see you in 6 months.   

## 2015-10-08 NOTE — Progress Notes (Signed)
Jonathon Avila Date of Birth: 1955-11-16   History of Present Illness: Jonathon Avila is seen  today for followup of CHF. 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 He is status post biventricular ICD. This did result in significant improvement in LV function. Last EF 45-50% in October 2014.  He underwent a generator change out of his ICD on [id number] without complications.  He is doing well. He remains active. He denies dyspnea or chest pain. No palpitations or dizziness. He has lost about 6 lbs. He is thinking about starting bike riding.   Current Outpatient Prescriptions on File Prior to Visit  Medication Sig Dispense Refill  . aspirin EC 81 MG tablet Take 1 tablet (81 mg total) by mouth daily. 90 tablet 3  . carvedilol (COREG) 25 MG tablet TAKE ONE TABLET BY MOUTH TWICE DAILY WITH MEALS (Patient taking differently: Take 25 mg by mouth 2 (two) times daily with a meal. TAKE ONE TABLET BY MOUTH TWICE DAILY WITH MEALS) 180 tablet 3  . cholecalciferol (VITAMIN D) 1000 UNITS tablet Take 2,000 Units by mouth 2 (two) times daily.     . fluticasone (FLONASE) 50 MCG/ACT nasal spray Place 2 sprays into the nose daily.    . furosemide (LASIX) 40 MG tablet Take 1 tablet (40 mg total) by mouth as needed. (Patient taking differently: Take 40 mg by mouth as needed for fluid. ) 30 tablet 6  . lisinopril (PRINIVIL,ZESTRIL) 5 MG tablet Take 5 mg by mouth daily.    . Loratadine (CLARITIN PO) Take 1 tablet by mouth daily as needed. For seasonal allergies    . montelukast (SINGULAIR) 10 MG tablet Take 10 mg by mouth daily. Take 1 tab daily at bedtime    . simvastatin (ZOCOR) 20 MG tablet Take 25 mg by mouth daily.     [name] spironolactone (ALDACTONE) 25 MG tablet TAKE ONE TABLET BY MOUTH AT BEDTIME 30 tablet 1   No current facility-administered medications on file prior to visit.    Allergies  Allergen Reactions  . Codeine     Upset stomach   . Shellfish-Derived Products  Nausea And Vomiting    Past Medical History  Diagnosis Date  . Hyperlipidemia   . LBBB (left bundle branch block)   . Nonischemic dilated cardiomyopathy ([place])   . Mitral insufficiency   . Pulmonary hypertension ([place])   . LV dysfunction   . Chronic systolic CHF (congestive heart failure) ([place])   . [id number] lead     6949 rate sense portion was replaced with a Medtronic 5076-lead 2010     Past Surgical History  Procedure Laterality Date  . Cardiac catheterization  10/25/2001    EF 65%  . Cholecystectomy    . Icd      BIVENTRICULAR  . US echocardiography  02/19/2009    EF 45%  . Ep implantable device N/A 09/14/2015    Procedure:  ICD Generator Changeout;  Surgeon: Deboraha Sprang, MD;  Location: [facility];  Service: Cardiovascular;  Laterality: N/A;    History  Smoking status  . Never Smoker   Smokeless tobacco  . Not on file    History  Alcohol Use No    Family History  Problem Relation Age of Onset  . COPD Mother     deceased  . Colon cancer Father     deceased    Review of Systems: As noted in history of present illness.  All other systems were  reviewed and are negative.  Physical Exam: BP 120/70 mmHg  Pulse 84  Ht 5\' 7"  (1.702 m)  Wt 112.492 kg (248 lb)  BMI 38.83 kg/m2 He is an overweight white male in no acute distress.  HEENT exam is normal.  The carotids are 2+ without bruits.  There is no thyromegaly.  There is no JVD.  The lungs are clear.    The heart exam reveals a regular rate with a normal S1 and S2.  There are no murmurs, gallops, or rubs.  The PMI is not displaced.  ICD is in place in the left upper chest. Abdominal exam reveals good bowel sounds.   There is no hepatosplenomegaly.  There are no masses.  Exam of the legs reveal no clubbing, cyanosis, or edema.  The distal pulses are intact.  Cranial nerves II - XII are intact.  Motor and sensory functions are intact.  The gait is normal.  LABORATORY DATA:  Lab Results  Component Value Date    WBC 10.0 09/11/2015   HGB 13.6 09/11/2015   HCT 40.9 09/11/2015   PLT 205 09/11/2015   GLUCOSE 121* 09/11/2015   CHOL  10/21/2009    112        ATP III CLASSIFICATION:  <200     mg/dL   Desirable  200-239  mg/dL   Borderline High  >=240    mg/dL   High          TRIG 129 10/21/2009   HDL 34* 10/21/2009   LDLCALC  10/21/2009    52        Total Cholesterol/HDL:CHD Risk Coronary Heart Disease Risk Table                     Men   Women  1/2 Average Risk   3.4   3.3  Average Risk       5.0   4.4  2 X Average Risk   9.6   7.1  3 X Average Risk  23.4   11.0        Use the calculated Patient Ratio above and the CHD Risk Table to determine the patient's CHD Risk.        ATP III CLASSIFICATION (LDL):  <100     mg/dL   Optimal  100-129  mg/dL   Near or Above                    Optimal  130-159  mg/dL   Borderline  160-189  mg/dL   High  >190     mg/dL   Very High   ALT 23 05/13/2015   AST 22 05/13/2015   NA 137 09/11/2015   K 4.2 09/11/2015   CL 100 09/11/2015   CREATININE 0.79 09/11/2015   BUN 12 09/11/2015   CO2 26 09/11/2015   INR 1.1 ratio* 04/26/2009   Labs reviewed from primary care 03/30/15: A1c 5.6%. CMET and CBC normal. Cholesterol 125, trig- 128, HDL 33, LDL 66.  Assessment / Plan: 1. Chronic systolic congestive heart failure. Ejection fraction of 45-50%. He appears to be well compensated. He is on optimal medical therapy with an ACE inhibitor, carvedilol, and Aldactone. He has a biventricular pacemaker in place. Continue current therapy.  2. Status post ICD/biventricular pacemaker. Continue followup in EP clinic. S/p generator change out.   3. Obesity.  4. Left bundle branch block.  Follow up in 6 months

## 2015-10-11 NOTE — Telephone Encounter (Signed)
Additional form faxed back to [facility] w/ Christella Scheuermann [facility] Leave Manager) at [phone] yesterday evening. Confirmation received. I called the patient's wife back today to let her know this was done.

## 2015-11-02 ENCOUNTER — Encounter: Payer: Self-pay | Admitting: Internal Medicine

## 2015-11-09 ENCOUNTER — Other Ambulatory Visit: Payer: Self-pay | Admitting: Cardiology

## 2016-01-08 ENCOUNTER — Encounter: Payer: Medicare Other | Admitting: Internal Medicine

## 2016-01-16 ENCOUNTER — Encounter: Payer: Self-pay | Admitting: Internal Medicine

## 2016-01-16 ENCOUNTER — Ambulatory Visit (INDEPENDENT_AMBULATORY_CARE_PROVIDER_SITE_OTHER): Payer: 59 | Admitting: Internal Medicine

## 2016-01-16 ENCOUNTER — Other Ambulatory Visit: Payer: Self-pay | Admitting: Cardiology

## 2016-01-16 VITALS — BP 106/60 | HR 70 | Ht 67.0 in | Wt 252.2 lb

## 2016-01-16 DIAGNOSIS — Z95810 Presence of automatic (implantable) cardiac defibrillator: Secondary | ICD-10-CM | POA: Diagnosis not present

## 2016-01-16 DIAGNOSIS — I429 Cardiomyopathy, unspecified: Secondary | ICD-10-CM | POA: Diagnosis not present

## 2016-01-16 DIAGNOSIS — I447 Left bundle-branch block, unspecified: Secondary | ICD-10-CM

## 2016-01-16 DIAGNOSIS — I5022 Chronic systolic (congestive) heart failure: Secondary | ICD-10-CM | POA: Diagnosis not present

## 2016-01-16 DIAGNOSIS — I428 Other cardiomyopathies: Secondary | ICD-10-CM

## 2016-01-16 LAB — CUP PACEART INCLINIC DEVICE CHECK
Battery Remaining Longevity: 100 mo
Brady Statistic AP VS Percent: 0.02 %
Brady Statistic RA Percent Paced: 20.05 %
Date Time Interrogation Session: 20170510084554
HIGH POWER IMPEDANCE MEASURED VALUE: 80 Ohm
HighPow Impedance: 52 Ohm
Implantable Lead Implant Date: 20050120
Implantable Lead Implant Date: 20100826
Implantable Lead Model: 4194
Implantable Lead Model: 5076
Lead Channel Pacing Threshold Amplitude: 0.5 V
Lead Channel Sensing Intrinsic Amplitude: 12.625 mV
Lead Channel Sensing Intrinsic Amplitude: 5.375 mV
Lead Channel Setting Pacing Amplitude: 2.5 V
MDC IDC LEAD IMPLANT DT: 20050120
MDC IDC LEAD LOCATION: 753858
MDC IDC LEAD LOCATION: 753859
MDC IDC LEAD LOCATION: 753860
MDC IDC MSMT BATTERY VOLTAGE: 3.05 V
MDC IDC MSMT LEADCHNL LV IMPEDANCE VALUE: 304 Ohm
MDC IDC MSMT LEADCHNL LV IMPEDANCE VALUE: 589 Ohm
MDC IDC MSMT LEADCHNL LV IMPEDANCE VALUE: 760 Ohm
MDC IDC MSMT LEADCHNL LV PACING THRESHOLD AMPLITUDE: 1.5 V
MDC IDC MSMT LEADCHNL LV PACING THRESHOLD PULSEWIDTH: 0.4 ms
MDC IDC MSMT LEADCHNL RA IMPEDANCE VALUE: 399 Ohm
MDC IDC MSMT LEADCHNL RA PACING THRESHOLD AMPLITUDE: 0.75 V
MDC IDC MSMT LEADCHNL RA PACING THRESHOLD PULSEWIDTH: 0.4 ms
MDC IDC MSMT LEADCHNL RA SENSING INTR AMPL: 3.875 mV
MDC IDC MSMT LEADCHNL RV IMPEDANCE VALUE: 551 Ohm
MDC IDC MSMT LEADCHNL RV IMPEDANCE VALUE: 703 Ohm
MDC IDC MSMT LEADCHNL RV PACING THRESHOLD PULSEWIDTH: 0.4 ms
MDC IDC MSMT LEADCHNL RV SENSING INTR AMPL: 14.25 mV
MDC IDC SET LEADCHNL LV PACING PULSEWIDTH: 0.4 ms
MDC IDC SET LEADCHNL RA PACING AMPLITUDE: 1.5 V
MDC IDC SET LEADCHNL RV PACING AMPLITUDE: 2 V
MDC IDC SET LEADCHNL RV PACING PULSEWIDTH: 0.4 ms
MDC IDC SET LEADCHNL RV SENSING SENSITIVITY: 0.45 mV
MDC IDC STAT BRADY AP VP PERCENT: 20.03 %
MDC IDC STAT BRADY AS VP PERCENT: 79.92 %
MDC IDC STAT BRADY AS VS PERCENT: 0.03 %
MDC IDC STAT BRADY RV PERCENT PACED: 99.93 %

## 2016-01-16 LAB — BASIC METABOLIC PANEL
BUN: 9 mg/dL (ref 7–25)
CALCIUM: 9.1 mg/dL (ref 8.6–10.3)
CO2: 25 mmol/L (ref 20–31)
Chloride: 101 mmol/L (ref 98–110)
Creat: 0.7 mg/dL (ref 0.70–1.33)
GLUCOSE: 120 mg/dL — AB (ref 65–99)
Potassium: 4.2 mmol/L (ref 3.5–5.3)
Sodium: 136 mmol/L (ref 135–146)

## 2016-01-16 NOTE — Progress Notes (Signed)
HPI  Jonathon Avila is a 60 y.o. male  seen in followup for a CRT-D. implanted for congestive heart failure in the setting of nonischemic heart disease. He is doing pretty well. He remains class II.  He had a 6949 lead replaced with 5076;  The patient denies chest pain, nocturnal dyspnea, orthopnea or peripheral edema.  There have been no palpitations, lightheadedness or syncope.  He has some shortness of breath when he walks; he restrains his pace     .  Past Medical History  Diagnosis Date  . Hyperlipidemia   . LBBB (left bundle branch block)   . Nonischemic dilated cardiomyopathy ([place])   . Mitral insufficiency   . Pulmonary hypertension ([place])   . LV dysfunction   . Chronic systolic CHF (congestive heart failure) ([place])   . [phone] lead     6949 rate sense portion was replaced with a Medtronic 5076-lead 2010     Past Surgical History  Procedure Laterality Date  . Cardiac catheterization  10/25/2001    EF 65%  . Cholecystectomy    . Icd      BIVENTRICULAR  . US echocardiography  02/19/2009    EF 45%  . Ep implantable device N/A 09/14/2015    Procedure:  ICD Generator Changeout;  Surgeon: Deboraha Sprang, MD;  Location: [place] [facility];  Service: Cardiovascular;  Laterality: N/A;    Current Outpatient Prescriptions  Medication Sig Dispense Refill  . aspirin EC 81 MG tablet Take 1 tablet (81 mg total) by mouth daily. 90 tablet 3  . carvedilol (COREG) 25 MG tablet Take 25 mg by mouth 2 (two) times daily with a meal.    . cholecalciferol (VITAMIN D) 1000 UNITS tablet Take 2,000 Units by mouth 2 (two) times daily.     . fluticasone (FLONASE) 50 MCG/ACT nasal spray Place 2 sprays into the nose daily.    . furosemide (LASIX) 40 MG tablet Take 40 mg by mouth as directed.    [name] lisinopril (PRINIVIL,ZESTRIL) 5 MG tablet Take 5 mg by mouth daily.    . Loratadine (CLARITIN PO) Take 1 tablet by mouth daily as needed. For seasonal allergies    . loratadine (CLARITIN) 10 MG tablet  Take 1 tablet by mouth daily.    . montelukast (SINGULAIR) 10 MG tablet Take 10 mg by mouth daily. Take 1 tab daily at bedtime    . simvastatin (ZOCOR) 20 MG tablet Take 25 mg by mouth daily.     . simvastatin (ZOCOR) 20 MG tablet Take 1 tablet by mouth daily.    [name] spironolactone (ALDACTONE) 25 MG tablet TAKE ONE TABLET BY MOUTH AT BEDTIME 30 tablet 4   No current facility-administered medications for this visit.    Allergies  Allergen Reactions  . Codeine     Upset stomach   . Shellfish-Derived Products Nausea And Vomiting    Review of Systems negative except from HPI and PMH  Physical Exam BP 106/60 mmHg  Pulse 70  Ht 5\' 7"  (1.702 m)  Wt 252 lb 3.2 oz (114.397 kg)  BMI 39.49 kg/m2  Well developed and well nourished in no acute distress HENT normal E scleral and icterus clear Neck Supple JVP flat; carotids brisk and full Clear to ausculation Device pocket well healed; without hematoma or erythema Regular rate and rhythm, no murmurs gallops or rub Soft with active bowel sounds No clubbing cyanosis and edema Alert and oriented, grossly normal motor and sensory function Skin Warm and Dry  ECG demonstrates AV pacing with a biventricular configuration  Assessment and  Plan  Nonischemic cardiomyopathy  Sleep disordered breathing  6949-lead replaced with a [id number]  Systolic heart failure chronic 2 Euvolemic continue current meds  Implantable defibrillator-CRT-Medtronic The patient's device was interrogated.  The information was reviewed. No changes were made in the programming.        Device funciton is normal  To see PCP in a few weeks-- I've asked him to discuss with her a sleep study  We'll check potassium levels on his high risk Aldactone; last level was 4.21/17 creatinine at that time

## 2016-01-16 NOTE — Patient Instructions (Signed)
Medication Instructions: - Your physician recommends that you continue on your current medications as directed. Please refer to the Current Medication list given to you today.  Labwork: - Your physician recommends that you have lab work today: [employer]  Procedures/Testing: - none  Follow-Up: - Remote monitoring is used to monitor your Pacemaker of ICD from home. This monitoring reduces the number of office visits required to check your device to one time per year. It allows [place] to keep an eye on the functioning of your device to ensure it is working properly. You are scheduled for a device check from home on 04/16/16. You may send your transmission at any time that day. If you have a wireless device, the transmission will be sent automatically. After your physician reviews your transmission, you will receive a postcard with your next transmission date.  - Your physician wants you to follow-up in: 6 months with Chanetta Marshall, NP for Dr. Caryl Comes. You will receive a reminder letter in the mail two months in advance. If you don't receive a letter, please call our office to schedule the follow-up appointment.  Any Additional Special Instructions Will Be Listed Below (If Applicable).     If you need a refill on your cardiac medications before your next appointment, please call your pharmacy.

## 2016-01-17 NOTE — Telephone Encounter (Signed)
Rx(s) sent to pharmacy electronically.  

## 2016-04-08 ENCOUNTER — Telehealth: Payer: Self-pay | Admitting: Cardiology

## 2016-04-08 NOTE — Telephone Encounter (Signed)
Received records from [facility] for appointment on 04/11/16 with Dr Martinique.  Records given to [place] records for Dr Doug Sou schedule on 04/11/16. lp

## 2016-04-10 NOTE — Progress Notes (Signed)
Jonathon Avila Date of Birth: Apr 12, 1956   History of Present Illness: Jonathon Avila is seen  today for followup of CHF. 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 He is status post biventricular ICD. This did result in significant improvement in LV function. Last EF 45-50% in October 2014.  He underwent a generator change out of his ICD on [id number] without complications. Last ICD check in May was satisfactory.  He is doing well. He remains active. He denies dyspnea or chest pain. No palpitations or dizziness. He has lost about 2 lbs. No edema, palpitations, or syncope.  Current Outpatient Prescriptions on File Prior to Visit  Medication Sig Dispense Refill  . aspirin EC 81 MG tablet Take 1 tablet (81 mg total) by mouth daily. 90 tablet 3  . carvedilol (COREG) 25 MG tablet TAKE ONE TABLET BY MOUTH TWICE DAILY WITH  MEALS. 180 tablet 2  . cholecalciferol (VITAMIN D) 1000 UNITS tablet Take 2,000 Units by mouth 2 (two) times daily.     . fluticasone (FLONASE) 50 MCG/ACT nasal spray Place 2 sprays into the nose daily.    . furosemide (LASIX) 40 MG tablet Take 40 mg by mouth as directed.    [name] lisinopril (PRINIVIL,ZESTRIL) 5 MG tablet Take 5 mg by mouth daily.    . Loratadine (CLARITIN PO) Take 1 tablet by mouth daily as needed. For seasonal allergies    . loratadine (CLARITIN) 10 MG tablet Take 1 tablet by mouth daily.    . montelukast (SINGULAIR) 10 MG tablet Take 10 mg by mouth daily. Take 1 tab daily at bedtime    . simvastatin (ZOCOR) 20 MG tablet Take 25 mg by mouth daily.     . simvastatin (ZOCOR) 20 MG tablet Take 1 tablet by mouth daily.    [name] spironolactone (ALDACTONE) 25 MG tablet TAKE ONE TABLET BY MOUTH AT BEDTIME 30 tablet 4   No current facility-administered medications on file prior to visit.     Allergies  Allergen Reactions  . Codeine     Upset stomach   . Shellfish-Derived Products Nausea And Vomiting    Past Medical History:   Diagnosis Date  . [phone] lead    6949 rate sense portion was replaced with a Medtronic 5076-lead 2010   . Chronic systolic CHF (congestive heart failure) ([place])   . Hyperlipidemia   . LBBB (left bundle branch block)   . LV dysfunction   . Mitral insufficiency   . Nonischemic dilated cardiomyopathy ([place])   . Pulmonary hypertension ([place])     Past Surgical History:  Procedure Laterality Date  . CARDIAC CATHETERIZATION  10/25/2001   EF 65%  . CHOLECYSTECTOMY    . EP IMPLANTABLE DEVICE N/A 09/14/2015   Procedure:  ICD Generator Changeout;  Surgeon: Deboraha Sprang, MD;  Location: [facility];  Service: Cardiovascular;  Laterality: N/A;  . ICD     BIVENTRICULAR  . US ECHOCARDIOGRAPHY  02/19/2009   EF 45%    History  Smoking Status  . Never Smoker  Smokeless Tobacco  . Not on file    History  Alcohol Use No    Family History  Problem Relation Age of Onset  . COPD Mother     deceased  . Colon cancer Father     deceased    Review of Systems: As noted in history of present illness.  All other systems were reviewed and are negative.  Physical Exam: There were no vitals  taken for this visit. He is an overweight white male in no acute distress.  HEENT exam is normal.  The carotids are 2+ without bruits.  There is no thyromegaly.  There is no JVD.  The lungs are clear.    The heart exam reveals a regular rate with a normal S1 and S2.  There are no murmurs, gallops, or rubs.  The PMI is not displaced.  ICD is in place in the left upper chest. Abdominal exam reveals good bowel sounds.   There is no hepatosplenomegaly.  There are no masses.  Exam of the legs reveal no clubbing, cyanosis, or edema.  The distal pulses are intact.  Cranial nerves II - XII are intact.  Motor and sensory functions are intact.  The gait is normal.  LABORATORY DATA:  Lab Results  Component Value Date   WBC 10.0 09/11/2015   HGB 13.6 09/11/2015   HCT 40.9 09/11/2015   PLT 205 09/11/2015   GLUCOSE  120 (H) 01/16/2016   CHOL  10/21/2009    112        ATP III CLASSIFICATION:  <200     mg/dL   Desirable  200-239  mg/dL   Borderline High  >=240    mg/dL   High          TRIG 129 10/21/2009   HDL 34 (L) 10/21/2009   LDLCALC  10/21/2009    52        Total Cholesterol/HDL:CHD Risk Coronary Heart Disease Risk Table                     Men   Women  1/2 Average Risk   3.4   3.3  Average Risk       5.0   4.4  2 X Average Risk   9.6   7.1  3 X Average Risk  23.4   11.0        Use the calculated Patient Ratio above and the CHD Risk Table to determine the patient's CHD Risk.        ATP III CLASSIFICATION (LDL):  <100     mg/dL   Optimal  100-129  mg/dL   Near or Above                    Optimal  130-159  mg/dL   Borderline  160-189  mg/dL   High  >190     mg/dL   Very High   ALT 23 05/13/2015   AST 22 05/13/2015   NA 136 01/16/2016   K 4.2 01/16/2016   CL 101 01/16/2016   CREATININE 0.70 01/16/2016   BUN 9 01/16/2016   CO2 25 01/16/2016   INR 1.1 ratio (H) 04/26/2009   Labs reviewed from primary care 03/30/15: A1c 5.6%. CMET and CBC normal. Cholesterol 125, trig- 128, HDL 33, LDL 66.  Assessment / Plan: 1. Chronic systolic congestive heart failure. Ejection fraction of 45-50%. He is well compensated. He is on optimal medical therapy with an ACE inhibitor, carvedilol, and Aldactone. He has a biventricular pacemaker in place. Continue current therapy.  2. Status post ICD/biventricular pacemaker. Continue followup in EP clinic. S/p generator change out.   3. Obesity.  4. Left bundle branch block.  Follow up in 6 months

## 2016-04-11 ENCOUNTER — Ambulatory Visit (INDEPENDENT_AMBULATORY_CARE_PROVIDER_SITE_OTHER): Payer: 59 | Admitting: Cardiology

## 2016-04-11 ENCOUNTER — Encounter: Payer: Self-pay | Admitting: Cardiology

## 2016-04-11 VITALS — BP 110/64 | HR 86 | Ht 67.0 in | Wt 250.6 lb

## 2016-04-11 DIAGNOSIS — I429 Cardiomyopathy, unspecified: Secondary | ICD-10-CM | POA: Diagnosis not present

## 2016-04-11 DIAGNOSIS — I5022 Chronic systolic (congestive) heart failure: Secondary | ICD-10-CM | POA: Diagnosis not present

## 2016-04-11 DIAGNOSIS — E785 Hyperlipidemia, unspecified: Secondary | ICD-10-CM

## 2016-04-11 DIAGNOSIS — I447 Left bundle-branch block, unspecified: Secondary | ICD-10-CM | POA: Diagnosis not present

## 2016-04-11 DIAGNOSIS — I428 Other cardiomyopathies: Secondary | ICD-10-CM

## 2016-04-11 NOTE — Patient Instructions (Signed)
Continue your current therapy  I will see you in 6 months.   

## 2016-04-16 ENCOUNTER — Telehealth: Payer: Self-pay | Admitting: Cardiology

## 2016-04-16 ENCOUNTER — Ambulatory Visit (INDEPENDENT_AMBULATORY_CARE_PROVIDER_SITE_OTHER): Payer: 59 | Admitting: *Deleted

## 2016-04-16 ENCOUNTER — Encounter: Payer: 59 | Admitting: *Deleted

## 2016-04-16 DIAGNOSIS — I5022 Chronic systolic (congestive) heart failure: Secondary | ICD-10-CM | POA: Diagnosis not present

## 2016-04-16 DIAGNOSIS — I428 Other cardiomyopathies: Secondary | ICD-10-CM

## 2016-04-16 NOTE — Telephone Encounter (Signed)
Spoke with pt and reminded pt of remote transmission that is due today. Pt verbalized understanding.   

## 2016-04-17 ENCOUNTER — Encounter: Payer: Self-pay | Admitting: Cardiology

## 2016-04-17 NOTE — Progress Notes (Signed)
Letter  

## 2016-04-23 ENCOUNTER — Other Ambulatory Visit: Payer: Self-pay | Admitting: Cardiology

## 2016-04-24 NOTE — Telephone Encounter (Signed)
REFILL 

## 2016-05-02 ENCOUNTER — Encounter: Payer: Self-pay | Admitting: Cardiology

## 2016-05-02 LAB — CUP PACEART REMOTE DEVICE CHECK
Battery Voltage: 3.02 V
Brady Statistic AP VP Percent: 20.29 %
Date Time Interrogation Session: 20170809163656
HIGH POWER IMPEDANCE MEASURED VALUE: 55 Ohm
HighPow Impedance: 86 Ohm
Implantable Lead Location: 753859
Implantable Lead Location: 753860
Implantable Lead Model: 5076
Lead Channel Impedance Value: 361 Ohm
Lead Channel Impedance Value: 817 Ohm
Lead Channel Pacing Threshold Amplitude: 0.625 V
Lead Channel Pacing Threshold Pulse Width: 0.4 ms
Lead Channel Pacing Threshold Pulse Width: 0.4 ms
Lead Channel Sensing Intrinsic Amplitude: 12.625 mV
Lead Channel Sensing Intrinsic Amplitude: 3.875 mV
Lead Channel Sensing Intrinsic Amplitude: 3.875 mV
Lead Channel Setting Sensing Sensitivity: 0.45 mV
MDC IDC LEAD IMPLANT DT: 20050120
MDC IDC LEAD IMPLANT DT: 20050120
MDC IDC LEAD IMPLANT DT: 20100826
MDC IDC LEAD LOCATION: 753858
MDC IDC LEAD MODEL: 4194
MDC IDC MSMT BATTERY REMAINING LONGEVITY: 99 mo
MDC IDC MSMT LEADCHNL LV IMPEDANCE VALUE: 304 Ohm
MDC IDC MSMT LEADCHNL LV IMPEDANCE VALUE: 646 Ohm
MDC IDC MSMT LEADCHNL LV PACING THRESHOLD AMPLITUDE: 1.125 V
MDC IDC MSMT LEADCHNL RA PACING THRESHOLD PULSEWIDTH: 0.4 ms
MDC IDC MSMT LEADCHNL RV IMPEDANCE VALUE: 589 Ohm
MDC IDC MSMT LEADCHNL RV IMPEDANCE VALUE: 722 Ohm
MDC IDC MSMT LEADCHNL RV PACING THRESHOLD AMPLITUDE: 0.5 V
MDC IDC MSMT LEADCHNL RV SENSING INTR AMPL: 12.625 mV
MDC IDC SET LEADCHNL LV PACING AMPLITUDE: 2.25 V
MDC IDC SET LEADCHNL LV PACING PULSEWIDTH: 0.4 ms
MDC IDC SET LEADCHNL RA PACING AMPLITUDE: 1.5 V
MDC IDC SET LEADCHNL RV PACING AMPLITUDE: 2 V
MDC IDC SET LEADCHNL RV PACING PULSEWIDTH: 0.4 ms
MDC IDC STAT BRADY AP VS PERCENT: 0.02 %
MDC IDC STAT BRADY AS VP PERCENT: 79.66 %
MDC IDC STAT BRADY AS VS PERCENT: 0.03 %
MDC IDC STAT BRADY RA PERCENT PACED: 20.3 %
MDC IDC STAT BRADY RV PERCENT PACED: 99.89 %

## 2016-05-02 NOTE — Progress Notes (Signed)
Remote ICD transmission.   

## 2016-07-22 ENCOUNTER — Other Ambulatory Visit: Payer: Self-pay

## 2016-07-22 MED ORDER — CARVEDILOL 25 MG PO TABS: 25.0000 mg | ORAL_TABLET | Freq: Two times a day (BID) | ORAL | 2 refills | Status: DC

## 2016-07-24 ENCOUNTER — Ambulatory Visit: Payer: 59 | Admitting: Physician Assistant

## 2016-07-29 ENCOUNTER — Encounter (INDEPENDENT_AMBULATORY_CARE_PROVIDER_SITE_OTHER): Payer: Self-pay

## 2016-07-29 ENCOUNTER — Encounter: Payer: Self-pay | Admitting: Gastroenterology

## 2016-07-29 ENCOUNTER — Ambulatory Visit (INDEPENDENT_AMBULATORY_CARE_PROVIDER_SITE_OTHER): Payer: 59 | Admitting: Gastroenterology

## 2016-07-29 VITALS — BP 122/74 | HR 80 | Ht 67.0 in | Wt 250.8 lb

## 2016-07-29 DIAGNOSIS — Z1211 Encounter for screening for malignant neoplasm of colon: Secondary | ICD-10-CM

## 2016-07-29 DIAGNOSIS — Z800 Family history of malignant neoplasm of digestive organs: Secondary | ICD-10-CM | POA: Insufficient documentation

## 2016-07-29 MED ORDER — NA SULFATE-K SULFATE-MG SULF 17.5-3.13-1.6 GM/177ML PO SOLN: 1.0000 | Freq: Once | ORAL | 0 refills | Status: AC

## 2016-07-29 NOTE — Patient Instructions (Signed)
If you are age 60 or older, your body mass index should be between 23-30. Your Body mass index is 39.28 kg/m. If this is out of the aforementioned range listed, please consider follow up with your Primary Care Provider.  If you are age 18 or younger, your body mass index should be between 19-25. Your Body mass index is 39.28 kg/m. If this is out of the aformentioned range listed, please consider follow up with your Primary Care Provider.   We have sent the following medications to your pharmacy for you to pick up at your convenience:  [place] have been scheduled for a colonoscopy. Please follow written instructions given to you at your visit today.  Please pick up your prep supplies at the pharmacy within the next 1-3 days. If you use inhalers (even only as needed), please bring them with you on the day of your procedure. Your physician has requested that you go to www.startemmi.com and enter the access code given to you at your visit today. This web site gives a general overview about your procedure. However, you should still follow specific instructions given to you by our office regarding your preparation for the procedure.

## 2016-07-29 NOTE — Progress Notes (Signed)
07/29/2016 Jonathon Avila [id number] [birth year]   HISTORY OF PRESENT ILLNESS:  This is a 60 year old male who is new to our practice.  He has CHF and an ICD/pacemaker.  Is stable from a cardiac standpoint, no new issues.  Most recent EF is 45-50%.  Had a colonoscopy at least 10 years ago by Dr. Amedeo Plenty, which he says was normal at that time.  Denies any GI complaints.  Says that his father had colon cancer in his 27's.   Past Medical History:  Diagnosis Date  . [phone] lead    6949 rate sense portion was replaced with a Medtronic 5076-lead 2010   . Chronic systolic CHF (congestive heart failure) ([place])   . Hyperlipidemia   . LBBB (left bundle branch block)   . LV dysfunction   . Mitral insufficiency   . Nonischemic dilated cardiomyopathy ([place])   . Pulmonary hypertension    Past Surgical History:  Procedure Laterality Date  . CARDIAC CATHETERIZATION  10/25/2001   EF 65%  . CHOLECYSTECTOMY    . EP IMPLANTABLE DEVICE N/A 09/14/2015   Procedure:  ICD Generator Changeout;  Surgeon: Deboraha Sprang, MD;  Location: [facility];  Service: Cardiovascular;  Laterality: N/A;  . ICD     BIVENTRICULAR  . US ECHOCARDIOGRAPHY  02/19/2009   EF 45%    reports that he has never smoked. He has never used smokeless tobacco. He reports that he does not drink alcohol or use drugs. family history includes COPD in his mother; Colon cancer in his father. Allergies  Allergen Reactions  . Codeine     Upset stomach   . Shellfish-Derived Products Nausea And Vomiting      Outpatient Encounter Prescriptions as of 07/29/2016  Medication Sig  . aspirin EC 81 MG tablet Take 1 tablet (81 mg total) by mouth daily.  . carvedilol (COREG) 25 MG tablet Take 1 tablet (25 mg total) by mouth 2 (two) times daily with a meal.  . cholecalciferol (VITAMIN D) 1000 UNITS tablet Take 2,000 Units by mouth 2 (two) times daily.   [name] docusate sodium (COLACE) 100 MG capsule Take 1-2 capsules by mouth 2 (two) times  daily as needed for constipation.  . fluticasone (FLONASE) 50 MCG/ACT nasal spray Place 2 sprays into the nose daily.  . furosemide (LASIX) 40 MG tablet Take 40 mg by mouth as directed.  [name] lisinopril (PRINIVIL,ZESTRIL) 5 MG tablet Take 5 mg by mouth daily.  . Loratadine (CLARITIN PO) Take 1 tablet by mouth daily as needed. For seasonal allergies  . loratadine (CLARITIN) 10 MG tablet Take 1 tablet by mouth daily.  . simvastatin (ZOCOR) 20 MG tablet Take 1 tablet by mouth daily.  [name] spironolactone (ALDACTONE) 25 MG tablet TAKE ONE TABLET BY MOUTH AT BEDTIME  . montelukast (SINGULAIR) 10 MG tablet Take 10 mg by mouth daily. Take 1 tab daily at bedtime   No facility-administered encounter medications on file as of 07/29/2016.      REVIEW OF SYSTEMS  : All other systems reviewed and negative except where noted in the History of Present Illness.   PHYSICAL EXAM: BP 122/74   Pulse 80   Ht 5\' 7"  (1.702 m)   Wt 250 lb 12.8 oz (113.8 kg)   BMI 39.28 kg/m  General: Well developed white male in no acute distress Head: Normocephalic and atraumatic Eyes:  Sclerae anicteric, conjunctiva pink. Ears: Normal auditory acuity Lungs: Clear throughout to auscultation Heart: Regular rate  and rhythm Abdomen: Soft, non-distended.  Normal bowel sounds.  Non-tender. Rectal:  Will be done at the time of colonoscopy. Musculoskeletal: Symmetrical with no gross deformities  Skin: No lesions on visible extremities Extremities: No edema  Neurological: Alert oriented x 4, grossly non-focal Psychological:  Alert and cooperative. Normal mood and affect  ASSESSMENT AND PLAN: -Screening colonoscopy/FH of colon cancer in his father:  Will schedule with Dr. Silverio Decamp.  The risks, benefits, and alternatives to colonoscopy were discussed with the patient and he consents to proceed.   CC:  Jonathon Arnt, MD

## 2016-07-30 NOTE — Progress Notes (Signed)
Reviewed and agree with documentation and assessment and plan. K. Veena Nandigam , MD   

## 2016-10-02 ENCOUNTER — Encounter: Payer: Self-pay | Admitting: Gastroenterology

## 2016-10-02 ENCOUNTER — Ambulatory Visit (AMBULATORY_SURGERY_CENTER): Payer: 59 | Admitting: Gastroenterology

## 2016-10-02 VITALS — BP 132/63 | HR 65 | Temp 96.8°F | Resp 14 | Ht 67.0 in | Wt 250.0 lb

## 2016-10-02 DIAGNOSIS — D123 Benign neoplasm of transverse colon: Secondary | ICD-10-CM | POA: Diagnosis not present

## 2016-10-02 DIAGNOSIS — D120 Benign neoplasm of cecum: Secondary | ICD-10-CM | POA: Diagnosis not present

## 2016-10-02 DIAGNOSIS — D127 Benign neoplasm of rectosigmoid junction: Secondary | ICD-10-CM

## 2016-10-02 DIAGNOSIS — Z1212 Encounter for screening for malignant neoplasm of rectum: Secondary | ICD-10-CM

## 2016-10-02 DIAGNOSIS — Z1211 Encounter for screening for malignant neoplasm of colon: Secondary | ICD-10-CM

## 2016-10-02 DIAGNOSIS — K635 Polyp of colon: Secondary | ICD-10-CM

## 2016-10-02 MED ORDER — SODIUM CHLORIDE 0.9 % IV SOLN: 500.0000 mL | INTRAVENOUS | Status: DC

## 2016-10-02 NOTE — Patient Instructions (Signed)
Discharge instructions given. Handouts on polyps,diverticulosis and hemorrhoids. Resume previous medications. YOU HAD AN ENDOSCOPIC PROCEDURE TODAY AT THE [facility]:   Refer to the procedure report that was given to you for any specific questions about what was found during the examination.  If the procedure report does not answer your questions, please call your gastroenterologist to clarify.  If you requested that your care partner not be given the details of your procedure findings, then the procedure report has been included in a sealed envelope for you to review at your convenience later.  YOU SHOULD EXPECT: Some feelings of bloating in the abdomen. Passage of more gas than usual.  Walking can help get rid of the air that was put into your GI tract during the procedure and reduce the bloating. If you had a lower endoscopy (such as a colonoscopy or flexible sigmoidoscopy) you may notice spotting of blood in your stool or on the toilet paper. If you underwent a bowel prep for your procedure, you may not have a normal bowel movement for a few days.  Please Note:  You might notice some irritation and congestion in your nose or some drainage.  This is from the oxygen used during your procedure.  There is no need for concern and it should clear up in a day or so.  SYMPTOMS TO REPORT IMMEDIATELY:   Following lower endoscopy (colonoscopy or flexible sigmoidoscopy):  Excessive amounts of blood in the stool  Significant tenderness or worsening of abdominal pains  Swelling of the abdomen that is new, acute  Fever of 100F or higher   For urgent or emergent issues, a gastroenterologist can be reached at any hour by calling [phone].   DIET:  We do recommend a small meal at first, but then you may proceed to your regular diet.  Drink plenty of fluids but you should avoid alcoholic beverages for 24 hours.  ACTIVITY:  You should plan to take it easy for the rest of today and you  should NOT DRIVE or use heavy machinery until tomorrow (because of the sedation medicines used during the test).    FOLLOW UP: Our staff will call the number listed on your records the next business day following your procedure to check on you and address any questions or concerns that you may have regarding the information given to you following your procedure. If we do not reach you, we will leave a message.  However, if you are feeling well and you are not experiencing any problems, there is no need to return our call.  We will assume that you have returned to your regular daily activities without incident.  If any biopsies were taken you will be contacted by phone or by letter within the next 1-3 weeks.  Please call us at [phone] if you have not heard about the biopsies in 3 weeks.    SIGNATURES/CONFIDENTIALITY: You and/or your care partner have signed paperwork which will be entered into your electronic medical record.  These signatures attest to the fact that that the information above on your After Visit Summary has been reviewed and is understood.  Full responsibility of the confidentiality of this discharge information lies with you and/or your care-partner. 

## 2016-10-02 NOTE — Op Note (Signed)
[facility] Patient Name: Jonathon Avila Procedure Date: 10/02/2016 7:23 AM MRN: [id number] Endoscopist: Mauri Pole , MD Age: 61 Referring MD:  Date of Birth: Jan 07, 1956 Gender: Male Account #: [id number] Procedure:                Colonoscopy Indications:              Screening in patient at increased risk: Family                            history of 1st-degree relative with colorectal                            cancer Medicines:                Monitored Anesthesia Care Procedure:                Pre-Anesthesia Assessment:                           - Prior to the procedure, a History and Physical                            was performed, and patient medications and                            allergies were reviewed. The patient's tolerance of                            previous anesthesia was also reviewed. The risks                            and benefits of the procedure and the sedation                            options and risks were discussed with the patient.                            All questions were answered, and informed consent                            was obtained. Prior Anticoagulants: The patient has                            taken no previous anticoagulant or antiplatelet                            agents. ASA Grade Assessment: III - A patient with                            severe systemic disease. After reviewing the risks                            and benefits, the patient was deemed in  satisfactory condition to undergo the procedure.                           After obtaining informed consent, the colonoscope                            was passed under direct vision. Throughout the                            procedure, the patient's blood pressure, pulse, and                            oxygen saturations were monitored continuously. The                            Colonoscope was introduced through the anus and                             advanced to the the terminal ileum, with                            identification of the appendiceal orifice and IC                            valve. The colonoscopy was performed without                            difficulty. The patient tolerated the procedure                            well. The quality of the bowel preparation was                            excellent. The terminal ileum, ileocecal valve,                            appendiceal orifice, and rectum were photographed. Scope In: 8:35:02 AM Scope Out: 8:55:04 AM Scope Withdrawal Time: 0 hours 14 minutes 26 seconds  Total Procedure Duration: 0 hours 20 minutes 2 seconds  Findings:                 The perianal and digital rectal examinations were                            normal.                           Six sessile polyps were found in the recto-sigmoid                            colon X3 , transverse colon X2 and hepatic flexure                            X1. The polyps were 1 to 3 mm in size. These polyps  were removed with a cold biopsy forceps. Resection                            and retrieval were complete.                           A 11 mm polyp was found in the cecum. The polyp was                            sessile. The polyp was removed with a cold snare.                            Resection and retrieval were complete.                           Multiple small and large-mouthed diverticula were                            found in the sigmoid colon.                           Non-bleeding internal hemorrhoids were found during                            retroflexion. The hemorrhoids were small. Complications:            No immediate complications. Estimated Blood Loss:     Estimated blood loss was minimal. Impression:               - Six 1 to 3 mm polyps at the recto-sigmoid colon,                            in the transverse colon and at the hepatic flexure,                             removed with a cold biopsy forceps. Resected and                            retrieved.                           - One 11 mm polyp in the cecum, removed with a cold                            snare. Resected and retrieved.                           - Diverticulosis in the sigmoid colon.                           - Non-bleeding internal hemorrhoids. Recommendation:           - Patient has a contact number available for  emergencies. The signs and symptoms of potential                            delayed complications were discussed with the                            patient. Return to normal activities tomorrow.                            Written discharge instructions were provided to the                            patient.                           - Resume previous diet.                           - Continue present medications.                           - Await pathology results.                           - Repeat colonoscopy in 3 years for surveillance                            based on pathology results.                           - Return to GI clinic PRN. Mauri Pole, MD 10/02/2016 9:01:48 AM This report has been signed electronically.

## 2016-10-02 NOTE — Progress Notes (Signed)
Report to PACU, RN, vss, BBS= Clear.  

## 2016-10-02 NOTE — Progress Notes (Signed)
Called to room to assist during endoscopic procedure.  Patient ID and intended procedure confirmed with present staff. Received instructions for my participation in the procedure from the performing physician.  

## 2016-10-03 ENCOUNTER — Telehealth: Payer: Self-pay

## 2016-10-03 NOTE — Telephone Encounter (Signed)
  Follow up Call-  Call back number 10/02/2016  Post procedure Call Back phone  # [phone] cell  Permission to leave phone message Yes  Some recent data might be hidden    2Patient questions: Do you have a fever, pain , or abdominal swelling? No. Pain Score  0 *  Have you tolerated food without any problems? Yes.    Have you been able to return to your normal activities? Yes.    Do you have any questions about your discharge instructions: Diet   No. Medications  No. Follow up visit  No.  Do you have questions or concerns about your Care? No.  Actions: * If pain score is 4 or above: No action needed, pain <4.

## 2016-10-07 DIAGNOSIS — K5730 Diverticulosis of large intestine without perforation or abscess without bleeding: Secondary | ICD-10-CM | POA: Insufficient documentation

## 2016-10-12 NOTE — Progress Notes (Signed)
Jonathon Avila Date of Birth: [birth year]   History of Present Illness: Jonathon Avila is seen  today for followup of CHF. 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 He is status post biventricular ICD. This did result in significant improvement in LV function. Last EF 45-50% in October 2014.  He underwent a generator change out of his ICD on [id number] without complications. Last ICD check in August  was satisfactory with normal Optivol and 100% BiV pacing. On Oct 02, 2016 he had colonoscopy with  Removal of several sessile polyps.  He is doing well. He remains active. He denies dyspnea or chest pain. No palpitations or dizziness. He has lost about 2 lbs. No edema, palpitations, or syncope.  Current Outpatient Prescriptions on File Prior to Visit  Medication Sig Dispense Refill  . aspirin EC 81 MG tablet Take 1 tablet (81 mg total) by mouth daily. 90 tablet 3  . carvedilol (COREG) 25 MG tablet Take 1 tablet (25 mg total) by mouth 2 (two) times daily with a meal. 180 tablet 2  . cholecalciferol (VITAMIN D) 1000 UNITS tablet Take 2,000 Units by mouth 2 (two) times daily.     [name] docusate sodium (COLACE) 100 MG capsule Take 1-2 capsules by mouth 2 (two) times daily as needed for constipation.    . fluticasone (FLONASE) 50 MCG/ACT nasal spray Place 2 sprays into the nose daily.    . furosemide (LASIX) 40 MG tablet Take 40 mg by mouth as directed.    [name] lisinopril (PRINIVIL,ZESTRIL) 5 MG tablet Take 5 mg by mouth daily.    [name] loratadine (CLARITIN) 10 MG tablet Take 1 tablet by mouth daily.    . montelukast (SINGULAIR) 10 MG tablet Take 10 mg by mouth at bedtime.    . simvastatin (ZOCOR) 20 MG tablet Take 1 tablet by mouth daily.    [name] spironolactone (ALDACTONE) 25 MG tablet TAKE ONE TABLET BY MOUTH AT BEDTIME 30 tablet 5  . montelukast (SINGULAIR) 10 MG tablet Take 10 mg by mouth daily. Take 1 tab daily at bedtime     Current Facility-Administered Medications on File  Prior to Visit  Medication Dose Route Frequency Provider Last Rate Last Dose  . 0.9 %  sodium chloride infusion  500 mL Intravenous Continuous Mauri Pole, MD        Allergies  Allergen Reactions  . Codeine     Upset stomach   . Shellfish-Derived Products Nausea And Vomiting    Past Medical History:  Diagnosis Date  . [phone] lead    6949 rate sense portion was replaced with a Medtronic 5076-lead 2010   . Allergy   . Chronic systolic CHF (congestive heart failure) ([place])   . Hyperlipidemia   . 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 LBBB (left bundle branch block)   . LV dysfunction   . Mitral insufficiency   . Nonischemic dilated cardiomyopathy ([place])   . Pulmonary hypertension     Past Surgical History:  Procedure Laterality Date  . CARDIAC CATHETERIZATION  10/25/2001   EF 65%  . CHOLECYSTECTOMY    . EP IMPLANTABLE DEVICE N/A 09/14/2015   Procedure:  ICD Generator Changeout;  Surgeon: Deboraha Sprang, MD;  Location: [facility];  Service: Cardiovascular;  Laterality: N/A;  . ICD     BIVENTRICULAR  . US ECHOCARDIOGRAPHY  02/19/2009   EF 45%  . wisdom teeth extraxtion  History  Smoking Status  . Never Smoker  Smokeless Tobacco  . Never Used    History  Alcohol Use No    Family History  Problem Relation Age of Onset  . COPD Mother     deceased  . Colon cancer Father     deceased age 93  . Prostate cancer Maternal Grandmother   . Esophageal cancer Neg Hx   . Pancreatic cancer Neg Hx   . Rectal cancer Neg Hx   . Stomach cancer Neg Hx     Review of Systems: As noted in history of present illness.  All other systems were reviewed and are negative.  Physical Exam: BP 128/74   Pulse 83   Ht 5\' 7"  (1.702 m)   Wt 248 lb (112.5 kg)   BMI 38.84 kg/m  He is an overweight white male in no acute distress.  HEENT exam is normal.  The carotids are 2+ without bruits.  There is no thyromegaly.  There  is no JVD.  The lungs are clear.    The heart exam reveals a regular rate with a normal S1 and S2.  There are no murmurs, gallops, or rubs.  The PMI is not displaced.  ICD is in place in the left upper chest. Abdominal exam reveals good bowel sounds.   There is no hepatosplenomegaly.  There are no masses.  Exam of the legs reveal no clubbing, cyanosis, or edema.  The distal pulses are intact.  Cranial nerves II - XII are intact.  Motor and sensory functions are intact.  The gait is normal.  LABORATORY DATA:  Lab Results  Component Value Date   WBC 10.0 09/11/2015   HGB 13.6 09/11/2015   HCT 40.9 09/11/2015   PLT 205 09/11/2015   GLUCOSE 120 (H) 01/16/2016   CHOL  10/21/2009    112        ATP III CLASSIFICATION:  <200     mg/dL   Desirable  200-239  mg/dL   Borderline High  >=240    mg/dL   High          TRIG 129 10/21/2009   HDL 34 (L) 10/21/2009   LDLCALC  10/21/2009    52        Total Cholesterol/HDL:CHD Risk Coronary Heart Disease Risk Table                     Men   Women  1/2 Average Risk   3.4   3.3  Average Risk       5.0   4.4  2 X Average Risk   9.6   7.1  3 X Average Risk  23.4   11.0        Use the calculated Patient Ratio above and the CHD Risk Table to determine the patient's CHD Risk.        ATP III CLASSIFICATION (LDL):  <100     mg/dL   Optimal  100-129  mg/dL   Near or Above                    Optimal  130-159  mg/dL   Borderline  160-189  mg/dL   High  >190     mg/dL   Very High   ALT 23 05/13/2015   AST 22 05/13/2015   NA 136 01/16/2016   K 4.2 01/16/2016   CL 101 01/16/2016   CREATININE 0.70 01/16/2016   BUN 9 01/16/2016  CO2 25 01/16/2016   INR 1.1 ratio (H) 04/26/2009   Labs reviewed from primary care 04/03/16: CMET and CBC normal. Cholesterol 116, trig- 132, HDL 31, LDL 59. BNP 14.1. Jul 04, 2016: A1c 5.7%. Oct 07, 2016 A1c 5.6%.   Assessment / Plan: 1. Chronic systolic congestive heart failure. Last Ejection fraction of 45-50% in October  2014. He is well compensated. He is on optimal medical therapy with an ACE inhibitor, carvedilol, and Aldactone. He has a biventricular pacemaker in place. Continue current therapy. We will update an Echocardiogram at this time.  2. Status post ICD/biventricular pacemaker. Continue followup in EP clinic. S/p generator change out Jan 2017.    3. Obesity.  4. Left bundle branch block.  Follow up in 6 months

## 2016-10-13 ENCOUNTER — Encounter: Payer: Self-pay | Admitting: Gastroenterology

## 2016-10-14 ENCOUNTER — Ambulatory Visit (INDEPENDENT_AMBULATORY_CARE_PROVIDER_SITE_OTHER): Payer: 59 | Admitting: Cardiology

## 2016-10-14 VITALS — BP 128/74 | HR 83 | Ht 67.0 in | Wt 248.0 lb

## 2016-10-14 DIAGNOSIS — I447 Left bundle-branch block, unspecified: Secondary | ICD-10-CM | POA: Diagnosis not present

## 2016-10-14 DIAGNOSIS — I5022 Chronic systolic (congestive) heart failure: Secondary | ICD-10-CM | POA: Diagnosis not present

## 2016-10-14 DIAGNOSIS — E7800 Pure hypercholesterolemia, unspecified: Secondary | ICD-10-CM | POA: Diagnosis not present

## 2016-10-14 DIAGNOSIS — I428 Other cardiomyopathies: Secondary | ICD-10-CM | POA: Diagnosis not present

## 2016-10-14 NOTE — Patient Instructions (Signed)
We will schedule you for an Echocardiogram  Continue your current therapy  I will see you in 6  Months.   

## 2016-10-14 NOTE — Addendum Note (Signed)
Addended by: Kathyrn Lass on: 10/14/2016 09:43 AM   Modules accepted: Orders

## 2016-10-27 ENCOUNTER — Other Ambulatory Visit (HOSPITAL_COMMUNITY): Payer: 59

## 2016-11-13 DIAGNOSIS — J3089 Other allergic rhinitis: Secondary | ICD-10-CM | POA: Insufficient documentation

## 2016-11-14 ENCOUNTER — Other Ambulatory Visit: Payer: Self-pay

## 2016-11-14 ENCOUNTER — Ambulatory Visit (HOSPITAL_COMMUNITY): Payer: 59 | Attending: Cardiology

## 2016-11-14 DIAGNOSIS — I071 Rheumatic tricuspid insufficiency: Secondary | ICD-10-CM | POA: Diagnosis not present

## 2016-11-14 DIAGNOSIS — I428 Other cardiomyopathies: Secondary | ICD-10-CM

## 2016-11-14 DIAGNOSIS — I447 Left bundle-branch block, unspecified: Secondary | ICD-10-CM | POA: Insufficient documentation

## 2016-11-14 DIAGNOSIS — Z6838 Body mass index (BMI) 38.0-38.9, adult: Secondary | ICD-10-CM | POA: Insufficient documentation

## 2016-11-14 DIAGNOSIS — E669 Obesity, unspecified: Secondary | ICD-10-CM | POA: Diagnosis not present

## 2016-11-14 DIAGNOSIS — E7800 Pure hypercholesterolemia, unspecified: Secondary | ICD-10-CM | POA: Diagnosis not present

## 2016-11-14 DIAGNOSIS — E785 Hyperlipidemia, unspecified: Secondary | ICD-10-CM | POA: Insufficient documentation

## 2016-11-14 DIAGNOSIS — I5022 Chronic systolic (congestive) heart failure: Secondary | ICD-10-CM | POA: Diagnosis present

## 2016-11-14 DIAGNOSIS — Z95810 Presence of automatic (implantable) cardiac defibrillator: Secondary | ICD-10-CM | POA: Diagnosis not present

## 2016-11-17 ENCOUNTER — Telehealth: Payer: Self-pay | Admitting: Cardiology

## 2016-11-17 NOTE — Telephone Encounter (Signed)
Notes Recorded by Peter M Martinique, MD on 11/14/2016 at 4:55 PM EST Echo is unchanged from 2014. EF still 45-50%.

## 2016-11-17 NOTE — Telephone Encounter (Signed)
°   New message    pt verbalized that he is calling to speak to rn for echo results

## 2016-11-17 NOTE — Telephone Encounter (Signed)
Pt notified of echo results. States no other questions

## 2016-11-22 ENCOUNTER — Other Ambulatory Visit: Payer: Self-pay | Admitting: Cardiology

## 2016-11-24 NOTE — Telephone Encounter (Signed)
Rx(s) sent to pharmacy electronically.  

## 2016-12-30 ENCOUNTER — Encounter: Payer: Self-pay | Admitting: Internal Medicine

## 2017-01-20 ENCOUNTER — Ambulatory Visit (INDEPENDENT_AMBULATORY_CARE_PROVIDER_SITE_OTHER): Payer: 59 | Admitting: Internal Medicine

## 2017-01-20 ENCOUNTER — Encounter: Payer: Self-pay | Admitting: Internal Medicine

## 2017-01-20 VITALS — BP 122/72 | HR 86 | Ht 67.0 in | Wt 247.0 lb

## 2017-01-20 DIAGNOSIS — I447 Left bundle-branch block, unspecified: Secondary | ICD-10-CM | POA: Diagnosis not present

## 2017-01-20 DIAGNOSIS — Z95810 Presence of automatic (implantable) cardiac defibrillator: Secondary | ICD-10-CM | POA: Diagnosis not present

## 2017-01-20 DIAGNOSIS — I5022 Chronic systolic (congestive) heart failure: Secondary | ICD-10-CM | POA: Diagnosis not present

## 2017-01-20 DIAGNOSIS — I428 Other cardiomyopathies: Secondary | ICD-10-CM | POA: Diagnosis not present

## 2017-01-20 NOTE — Patient Instructions (Signed)
Medication Instructions: - Your physician recommends that you continue on your current medications as directed. Please refer to the Current Medication list given to you today.  Labwork: - none ordered  Procedures/Testing: - none ordered  Follow-Up: - Remote monitoring is used to monitor your Pacemaker of ICD from home. This monitoring reduces the number of office visits required to check your device to one time per year. It allows [place] to keep an eye on the functioning of your device to ensure it is working properly. You are scheduled for a device check from home on 04/21/17. You may send your transmission at any time that day. If you have a wireless device, the transmission will be sent automatically. After your physician reviews your transmission, you will receive a postcard with your next transmission date.  - Your physician wants you to follow-up in: 1 year with Chanetta Marshall, NP for Dr. Caryl Comes. You will receive a reminder letter in the mail two months in advance. If you don't receive a letter, please call our office to schedule the follow-up appointment.   Any Additional Special Instructions Will Be Listed Below (If Applicable).     If you need a refill on your cardiac medications before your next appointment, please call your pharmacy.

## 2017-01-20 NOTE — Progress Notes (Signed)
HPI  Jonathon Avila is a 61 y.o. male seen in followup for a CRT-D. implanted for congestive heart failure in the setting of nonischemic heart disease. He is doing pretty well. He remains class II.  He had a 6949 lead replaced with 5076; generator replacement 1/17. Ventolin inhaler symptoms are increasing  The patient denies palpitations, lightheadedness or syncope.  He has some shortness of breath when he walks; nonpleuritic and without chest pain   He saw Dr. Shirlee More 2/18. Symptoms were stable. Echocardiogram was ordered.   DATE TEST    10/14    Echo   EF 45-50 %   3/18    Echo   EF 45-50 %           Date Cr K Mg  5/17  0.7 4.2               Intervals sleep study was apparently negative .  Past Medical History:  Diagnosis Date  . [phone] lead    6949 rate sense portion was replaced with a Medtronic 5076-lead 2010   . Allergy   . Chronic systolic CHF (congestive heart failure) ([place])   . Hyperlipidemia   . 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 LBBB (left bundle branch block)   . LV dysfunction   . Mitral insufficiency   . Nonischemic dilated cardiomyopathy ([place])   . Pulmonary hypertension ([place])     Past Surgical History:  Procedure Laterality Date  . CARDIAC CATHETERIZATION  10/25/2001   EF 65%  . CHOLECYSTECTOMY    . EP IMPLANTABLE DEVICE N/A 09/14/2015   Procedure:  ICD Generator Changeout;  Surgeon: Deboraha Sprang, MD;  Location: [facility];  Service: Cardiovascular;  Laterality: N/A;  . ICD     BIVENTRICULAR  . US ECHOCARDIOGRAPHY  02/19/2009   EF 45%  . wisdom teeth extraxtion      Current Outpatient Prescriptions  Medication Sig Dispense Refill  . aspirin EC 81 MG tablet Take 1 tablet (81 mg total) by mouth daily. 90 tablet 3  . carvedilol (COREG) 25 MG tablet Take 1 tablet (25 mg total) by mouth 2 (two) times daily with a meal. 180 tablet 2  . cholecalciferol (VITAMIN D) 1000 UNITS tablet Take  2,000 Units by mouth 2 (two) times daily.     [name] docusate sodium (COLACE) 100 MG capsule Take 1-2 capsules by mouth 2 (two) times daily as needed for constipation.    . fluticasone (FLONASE) 50 MCG/ACT nasal spray Place 2 sprays into the nose daily.    . furosemide (LASIX) 40 MG tablet Take 40 mg by mouth as directed.    [name] lisinopril (PRINIVIL,ZESTRIL) 5 MG tablet Take 5 mg by mouth daily.    [name] loratadine (CLARITIN) 10 MG tablet Take 1 tablet by mouth daily.    . montelukast (SINGULAIR) 10 MG tablet Take 10 mg by mouth at bedtime.    . simvastatin (ZOCOR) 20 MG tablet Take 1 tablet by mouth daily.    [name] spironolactone (ALDACTONE) 25 MG tablet TAKE ONE TABLET BY MOUTH AT BEDTIME 90 tablet 3  . montelukast (SINGULAIR) 10 MG tablet Take 10 mg by mouth daily. Take 1 tab daily at bedtime     Current Facility-Administered Medications  Medication Dose Route Frequency Provider Last Rate Last Dose  . 0.9 %  sodium chloride infusion  500 mL Intravenous Continuous Nandigam, Venia Minks, MD        Allergies  Allergen Reactions  . Codeine     Upset stomach   . Shellfish-Derived Products Nausea And Vomiting    Review of Systems negative except from HPI and PMH  Physical Exam BP 122/72   Pulse 86   Ht 5\' 7"  (1.702 m)   Wt 247 lb (112 kg)   SpO2 96%   BMI 38.69 kg/m  Well developed and well nourished in no acute distress HENT normal E scleral and icterus clear Neck Supple JVP flat; carotids brisk and full Clear to ausculation Device pocket well healed; without hematoma or erythema Regular rate and rhythm, no murmurs gallops or rub Soft with active bowel sounds No clubbing cyanosis and edema Alert and oriented, grossly normal motor and sensory function Skin Warm and Dry  ECG demonstrates AV pacing At a rate of 79 with a biventricular configuratio Intervals 15/16/43   Assessment and  Plan  Nonischemic cardiomyopathy with interval normalization    6949-lead replaced with a  4210  Systolic heart failure chronic class 2  High Risk Medication Surveillance  Implantable defibrillator-CRT-Medtronic The patient's device was interrogated.  The information was reviewed. No changes were made in the programming.      Euvolemic continue current meds  Dyspnea liekly 2/2 combined systolic/diastolic  Need to check BMET on aldactone and lisinopril   Will defer lipids to PCP

## 2017-01-21 LAB — CUP PACEART INCLINIC DEVICE CHECK
Battery Remaining Longevity: 89 mo
Brady Statistic AP VS Percent: 0.02 %
Brady Statistic AS VS Percent: 0.03 %
Brady Statistic RA Percent Paced: 26.07 %
HIGH POWER IMPEDANCE MEASURED VALUE: 86 Ohm
HighPow Impedance: 55 Ohm
Implantable Lead Implant Date: 20050120
Implantable Lead Implant Date: 20100826
Implantable Lead Location: 753858
Implantable Lead Location: 753859
Implantable Lead Location: 753860
Implantable Lead Model: 4194
Implantable Lead Model: 5076
Implantable Pulse Generator Implant Date: 20170106
Lead Channel Impedance Value: 304 Ohm
Lead Channel Impedance Value: 779 Ohm
Lead Channel Pacing Threshold Amplitude: 0.5 V
Lead Channel Pacing Threshold Amplitude: 1.375 V
Lead Channel Pacing Threshold Pulse Width: 0.4 ms
Lead Channel Sensing Intrinsic Amplitude: 13.25 mV
Lead Channel Setting Pacing Amplitude: 1.5 V
Lead Channel Setting Pacing Amplitude: 2 V
Lead Channel Setting Sensing Sensitivity: 0.45 mV
MDC IDC LEAD IMPLANT DT: 20050120
MDC IDC MSMT BATTERY VOLTAGE: 2.99 V
MDC IDC MSMT LEADCHNL LV IMPEDANCE VALUE: 608 Ohm
MDC IDC MSMT LEADCHNL LV PACING THRESHOLD PULSEWIDTH: 0.4 ms
MDC IDC MSMT LEADCHNL RA IMPEDANCE VALUE: 418 Ohm
MDC IDC MSMT LEADCHNL RA PACING THRESHOLD AMPLITUDE: 0.75 V
MDC IDC MSMT LEADCHNL RA SENSING INTR AMPL: 4.875 mV
MDC IDC MSMT LEADCHNL RV IMPEDANCE VALUE: 551 Ohm
MDC IDC MSMT LEADCHNL RV IMPEDANCE VALUE: 703 Ohm
MDC IDC MSMT LEADCHNL RV PACING THRESHOLD PULSEWIDTH: 0.4 ms
MDC IDC SESS DTM: 20180515183230
MDC IDC SET LEADCHNL LV PACING AMPLITUDE: 2.5 V
MDC IDC SET LEADCHNL LV PACING PULSEWIDTH: 0.4 ms
MDC IDC SET LEADCHNL RV PACING PULSEWIDTH: 0.4 ms
MDC IDC STAT BRADY AP VP PERCENT: 26.05 %
MDC IDC STAT BRADY AS VP PERCENT: 73.9 %
MDC IDC STAT BRADY RV PERCENT PACED: 99.9 %

## 2017-01-21 LAB — BASIC METABOLIC PANEL
BUN/Creatinine Ratio: 16 (ref 10–24)
BUN: 13 mg/dL (ref 8–27)
CALCIUM: 9.6 mg/dL (ref 8.6–10.2)
CO2: 24 mmol/L (ref 18–29)
CREATININE: 0.81 mg/dL (ref 0.76–1.27)
Chloride: 96 mmol/L (ref 96–106)
GFR calc Af Amer: 112 mL/min/{1.73_m2} (ref >59)
GFR calc non Af Amer: 97 mL/min/{1.73_m2} (ref >59)
GLUCOSE: 97 mg/dL (ref 65–99)
POTASSIUM: 4.3 mmol/L (ref 3.5–5.2)
SODIUM: 138 mmol/L (ref 134–144)

## 2017-01-22 ENCOUNTER — Telehealth: Payer: Self-pay | Admitting: Internal Medicine

## 2017-01-22 ENCOUNTER — Encounter: Payer: Self-pay | Admitting: *Deleted

## 2017-01-22 NOTE — Telephone Encounter (Signed)
I left a message of results on the patient's voice mail.

## 2017-01-22 NOTE — Telephone Encounter (Signed)
New message ° ° ° ° °Returning a call to the nurse to get lab results °

## 2017-04-21 ENCOUNTER — Ambulatory Visit (INDEPENDENT_AMBULATORY_CARE_PROVIDER_SITE_OTHER): Payer: 59 | Admitting: *Deleted

## 2017-04-21 DIAGNOSIS — I447 Left bundle-branch block, unspecified: Secondary | ICD-10-CM

## 2017-04-21 DIAGNOSIS — I428 Other cardiomyopathies: Secondary | ICD-10-CM | POA: Diagnosis not present

## 2017-04-21 DIAGNOSIS — I5022 Chronic systolic (congestive) heart failure: Secondary | ICD-10-CM | POA: Diagnosis not present

## 2017-04-22 LAB — CUP PACEART REMOTE DEVICE CHECK
Battery Remaining Longevity: 85 mo
Brady Statistic AP VP Percent: 33.21 %
Brady Statistic AP VS Percent: 0.02 %
Brady Statistic AS VP Percent: 66.74 %
Brady Statistic AS VS Percent: 0.03 %
Brady Statistic RV Percent Paced: 99.91 %
Date Time Interrogation Session: 20180814073524
HighPow Impedance: 53 Ohm
HighPow Impedance: 77 Ohm
Implantable Lead Implant Date: 20050120
Implantable Lead Implant Date: 20100826
Implantable Lead Location: 753858
Implantable Lead Location: 753860
Implantable Lead Model: 5076
Implantable Lead Model: 5076
Implantable Pulse Generator Implant Date: 20170106
Lead Channel Impedance Value: 285 Ohm
Lead Channel Impedance Value: 361 Ohm
Lead Channel Impedance Value: 532 Ohm
Lead Channel Impedance Value: 608 Ohm
Lead Channel Impedance Value: 836 Ohm
Lead Channel Pacing Threshold Amplitude: 0.5 V
Lead Channel Pacing Threshold Amplitude: 0.625 V
Lead Channel Pacing Threshold Amplitude: 1.375 V
Lead Channel Pacing Threshold Pulse Width: 0.4 ms
Lead Channel Pacing Threshold Pulse Width: 0.4 ms
Lead Channel Pacing Threshold Pulse Width: 0.4 ms
Lead Channel Sensing Intrinsic Amplitude: 12.375 mV
Lead Channel Sensing Intrinsic Amplitude: 3.375 mV
Lead Channel Setting Pacing Amplitude: 1.5 V
Lead Channel Setting Pacing Amplitude: 2 V
Lead Channel Setting Pacing Pulse Width: 0.4 ms
Lead Channel Setting Sensing Sensitivity: 0.45 mV
MDC IDC LEAD IMPLANT DT: 20050120
MDC IDC LEAD LOCATION: 753859
MDC IDC MSMT BATTERY VOLTAGE: 2.99 V
MDC IDC MSMT LEADCHNL RA SENSING INTR AMPL: 3.375 mV
MDC IDC MSMT LEADCHNL RV IMPEDANCE VALUE: 646 Ohm
MDC IDC MSMT LEADCHNL RV SENSING INTR AMPL: 12.375 mV
MDC IDC SET LEADCHNL LV PACING AMPLITUDE: 2.5 V
MDC IDC SET LEADCHNL RV PACING PULSEWIDTH: 0.4 ms
MDC IDC STAT BRADY RA PERCENT PACED: 33.23 %

## 2017-04-22 NOTE — Progress Notes (Signed)
Remote defibrillator check.  

## 2017-04-24 ENCOUNTER — Other Ambulatory Visit: Payer: Self-pay | Admitting: Cardiology

## 2017-05-05 ENCOUNTER — Encounter: Payer: Self-pay | Admitting: Cardiology

## 2017-06-03 ENCOUNTER — Encounter: Payer: Self-pay | Admitting: Cardiology

## 2017-06-08 NOTE — Progress Notes (Signed)
Camillia Herter Kropp Date of Birth: Feb 22, 1956   History of Present Illness: Jonathon Avila is seen  today for followup of CHF. 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 He is status post biventricular ICD. This did result in significant improvement in LV function. Echo was last check in March 2018 and was unchanged from 2014 with EF 45-50%. He underwent a generator change out of his ICD on 01/09/60 without complications. Last ICD check in August 2018 was satisfactory with normal Optivol and 100% BiV pacing. .  On follow up today he is doing well. He remains active. Walks his dog twice a day.  He denies dyspnea or chest pain. No palpitations or dizziness. He has lost  2 lbs. No edema, palpitations, or syncope. He is growing his beard out in order to [place] this holiday season.  Current Outpatient Prescriptions on File Prior to Visit  Medication Sig Dispense Refill  . aspirin EC 81 MG tablet Take 1 tablet (81 mg total) by mouth daily. 90 tablet 3  . carvedilol (COREG) 25 MG tablet Take 1 tablet (25 mg total) by mouth 2 (two) times daily with a meal. 180 tablet 1  . cholecalciferol (VITAMIN D) 1000 UNITS tablet Take 2,000 Units by mouth 2 (two) times daily.     [name] docusate sodium (COLACE) 100 MG capsule Take 1-2 capsules by mouth 2 (two) times daily as needed for constipation.    . fluticasone (FLONASE) 50 MCG/ACT nasal spray Place 2 sprays into the nose daily.    . furosemide (LASIX) 40 MG tablet Take 40 mg by mouth as directed.    [name] lisinopril (PRINIVIL,ZESTRIL) 5 MG tablet Take 5 mg by mouth daily.    [name] loratadine (CLARITIN) 10 MG tablet Take 1 tablet by mouth daily.    . montelukast (SINGULAIR) 10 MG tablet Take 10 mg by mouth at bedtime.    . simvastatin (ZOCOR) 20 MG tablet Take 1 tablet by mouth daily.    [name] spironolactone (ALDACTONE) 25 MG tablet TAKE ONE TABLET BY MOUTH AT BEDTIME 90 tablet 3   Current Facility-Administered Medications on File  Prior to Visit  Medication Dose Route Frequency Provider Last Rate Last Dose  . 0.9 %  sodium chloride infusion  500 mL Intravenous Continuous Nandigam, Venia Minks, MD        Allergies  Allergen Reactions  . Codeine     Upset stomach   . Shellfish-Derived Products Nausea And Vomiting    Past Medical History:  Diagnosis Date  . [phone] lead    6949 rate sense portion was replaced with a Medtronic 5076-lead 2010   . Allergy   . Chronic systolic CHF (congestive heart failure) ([place])   . Hyperlipidemia   . 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 LBBB (left bundle branch block)   . LV dysfunction   . Mitral insufficiency   . Nonischemic dilated cardiomyopathy ([place])   . Pulmonary hypertension ([place])     Past Surgical History:  Procedure Laterality Date  . CARDIAC CATHETERIZATION  10/25/2001   EF 65%  . CHOLECYSTECTOMY    . EP IMPLANTABLE DEVICE N/A 09/14/2015   Procedure:  ICD Generator Changeout;  Surgeon: Deboraha Sprang, MD;  Location: [facility];  Service: Cardiovascular;  Laterality: N/A;  . ICD     BIVENTRICULAR  . US ECHOCARDIOGRAPHY  02/19/2009   EF 45%  . wisdom teeth extraxtion  History  Smoking Status  . Never Smoker  Smokeless Tobacco  . Never Used    History  Alcohol Use No    Family History  Problem Relation Age of Onset  . COPD Mother        deceased  . Colon cancer Father        deceased age 72  . Prostate cancer Maternal Grandmother   . Esophageal cancer Neg Hx   . Pancreatic cancer Neg Hx   . Rectal cancer Neg Hx   . Stomach cancer Neg Hx     Review of Systems: As noted in history of present illness.  All other systems were reviewed and are negative.  Physical Exam: BP 110/72   Pulse 71   Ht 5\' 7"  (1.702 m)   Wt 245 lb (111.1 kg)   BMI 38.37 kg/m  GENERAL:  Well appearing WM in NAD HEENT:  PERRL, EOMI, sclera are clear. Oropharynx is clear. NECK:  No jugular venous distention,  carotid upstroke brisk and symmetric, no bruits, no thyromegaly or adenopathy LUNGS:  Clear to auscultation bilaterally CHEST:  Unremarkable, ICD in place left chest.  HEART:  RRR,  PMI not displaced or sustained,S1 and S2 within normal limits, no S3, no S4: no clicks, no rubs, no murmurs ABD:  Soft, nontender. BS +, no masses or bruits. No hepatomegaly, no splenomegaly EXT:  2 + pulses throughout, no edema, no cyanosis no clubbing SKIN:  Warm and dry.  No rashes NEURO:  Alert and oriented x 3. Cranial nerves II through XII intact. PSYCH:  Cognitively intact    LABORATORY DATA:  Lab Results  Component Value Date   WBC 10.0 09/11/2015   HGB 13.6 09/11/2015   HCT 40.9 09/11/2015   PLT 205 09/11/2015   GLUCOSE 97 01/20/2017   CHOL  10/21/2009    112        ATP III CLASSIFICATION:  <200     mg/dL   Desirable  200-239  mg/dL   Borderline High  >=240    mg/dL   High          TRIG 129 10/21/2009   HDL 34 (L) 10/21/2009   LDLCALC  10/21/2009    52        Total Cholesterol/HDL:CHD Risk Coronary Heart Disease Risk Table                     Men   Women  1/2 Average Risk   3.4   3.3  Average Risk       5.0   4.4  2 X Average Risk   9.6   7.1  3 X Average Risk  23.4   11.0        Use the calculated Patient Ratio above and the CHD Risk Table to determine the patient's CHD Risk.        ATP III CLASSIFICATION (LDL):  <100     mg/dL   Optimal  100-129  mg/dL   Near or Above                    Optimal  130-159  mg/dL   Borderline  160-189  mg/dL   High  >190     mg/dL   Very High   ALT 23 05/13/2015   AST 22 05/13/2015   NA 138 01/20/2017   K 4.3 01/20/2017   CL 96 01/20/2017   CREATININE 0.81 01/20/2017   BUN 13 01/20/2017  CO2 24 01/20/2017   INR 1.1 ratio (H) 04/26/2009   Labs reviewed from primary care 04/03/16: CMET and CBC normal. Cholesterol 116, trig- 132, HDL 31, LDL 59. BNP 14.1. Jul 04, 2016: A1c 5.7%. Oct 07, 2016 A1c 5.6%.  Dated 04/15/17: glucose 115. A1c 5.6%.  Otherwise CMET and CBC normal. Cholesterol 120, triglycerides 109, HDL 35, LDL 63.   Echo 11/14/16: Study Conclusions  - Left ventricle: The cavity size was normal. Wall thickness was   increased in a pattern of mild LVH. Systolic function was mildly   reduced. The estimated ejection fraction was in the range of 45%   to 50%. Incoordinate septal motion. Doppler parameters are   consistent with abnormal left ventricular relaxation (grade 1   diastolic dysfunction). The E/e&' ratio is between 8-15,   suggesting indeterminate LV filling pressure. - Left atrium: The atrium was normal in size. - Right ventricle: The cavity size was normal. Wall thickness was   normal. Pacer wire or catheter noted in right ventricle. Systolic   function was normal. - Right atrium: The atrium was mildly dilated. Pacer wire or   catheter noted in right atrium. - Atrial septum: No defect or patent foramen ovale was identified. - Tricuspid valve: There was mild regurgitation. - Pulmonary arteries: PA peak pressure: 27 mm Hg (S). - Inferior vena cava: The vessel was normal in size. The   respirophasic diameter changes were in the normal range (>= 50%),   consistent with normal central venous pressure.  Impressions:  - Compared to a previous study in 2014, there has been no change.  Assessment / Plan: 1. Chronic systolic congestive heart failure. Last Ejection fraction of 45-50% in March 2018- unchanged from 2014. He is well compensated. He is on optimal medical therapy with an ACE inhibitor, carvedilol, and Aldactone.  He has a biventricular pacemaker in place. Continue current therapy.   2. Status post ICD/biventricular pacemaker. Continue followup in EP clinic. S/p generator change out Jan 2017. ICD check in August was good.    3. Obesity.  4. Left bundle branch block.  Follow up in 6 months

## 2017-06-11 ENCOUNTER — Encounter: Payer: Self-pay | Admitting: Cardiology

## 2017-06-11 ENCOUNTER — Ambulatory Visit (INDEPENDENT_AMBULATORY_CARE_PROVIDER_SITE_OTHER): Payer: 59 | Admitting: Cardiology

## 2017-06-11 VITALS — BP 110/72 | HR 71 | Ht 67.0 in | Wt 245.0 lb

## 2017-06-11 DIAGNOSIS — I428 Other cardiomyopathies: Secondary | ICD-10-CM

## 2017-06-11 DIAGNOSIS — E7800 Pure hypercholesterolemia, unspecified: Secondary | ICD-10-CM

## 2017-06-11 DIAGNOSIS — Z95810 Presence of automatic (implantable) cardiac defibrillator: Secondary | ICD-10-CM

## 2017-06-11 DIAGNOSIS — I447 Left bundle-branch block, unspecified: Secondary | ICD-10-CM | POA: Diagnosis not present

## 2017-06-11 DIAGNOSIS — I5022 Chronic systolic (congestive) heart failure: Secondary | ICD-10-CM

## 2017-06-11 NOTE — Patient Instructions (Signed)
Continue your current therapy  I will see you in 6 months.   

## 2017-07-21 ENCOUNTER — Ambulatory Visit (INDEPENDENT_AMBULATORY_CARE_PROVIDER_SITE_OTHER): Payer: Medicare Other | Admitting: *Deleted

## 2017-07-21 DIAGNOSIS — I5022 Chronic systolic (congestive) heart failure: Secondary | ICD-10-CM

## 2017-07-21 DIAGNOSIS — I428 Other cardiomyopathies: Secondary | ICD-10-CM

## 2017-07-21 NOTE — Progress Notes (Signed)
Remote ICD transmission.   

## 2017-07-22 LAB — CUP PACEART REMOTE DEVICE CHECK
Battery Voltage: 2.99 V
Brady Statistic AP VP Percent: 29.95 %
Brady Statistic AS VP Percent: 70.01 %
Brady Statistic RA Percent Paced: 29.97 %
HIGH POWER IMPEDANCE MEASURED VALUE: 79 Ohm
HighPow Impedance: 56 Ohm
Implantable Lead Implant Date: 20050120
Implantable Lead Location: 753858
Implantable Lead Model: 4194
Implantable Lead Model: 5076
Lead Channel Impedance Value: 418 Ohm
Lead Channel Impedance Value: 665 Ohm
Lead Channel Impedance Value: 874 Ohm
Lead Channel Pacing Threshold Amplitude: 0.625 V
Lead Channel Pacing Threshold Pulse Width: 0.4 ms
Lead Channel Pacing Threshold Pulse Width: 0.4 ms
Lead Channel Sensing Intrinsic Amplitude: 12.375 mV
Lead Channel Sensing Intrinsic Amplitude: 3.875 mV
Lead Channel Setting Pacing Amplitude: 1.5 V
Lead Channel Setting Pacing Pulse Width: 0.4 ms
Lead Channel Setting Pacing Pulse Width: 0.4 ms
Lead Channel Setting Sensing Sensitivity: 0.45 mV
MDC IDC LEAD IMPLANT DT: 20050120
MDC IDC LEAD IMPLANT DT: 20100826
MDC IDC LEAD LOCATION: 753859
MDC IDC LEAD LOCATION: 753860
MDC IDC MSMT BATTERY REMAINING LONGEVITY: 83 mo
MDC IDC MSMT LEADCHNL LV IMPEDANCE VALUE: 342 Ohm
MDC IDC MSMT LEADCHNL LV PACING THRESHOLD AMPLITUDE: 1.375 V
MDC IDC MSMT LEADCHNL LV PACING THRESHOLD PULSEWIDTH: 0.4 ms
MDC IDC MSMT LEADCHNL RA SENSING INTR AMPL: 3.875 mV
MDC IDC MSMT LEADCHNL RV IMPEDANCE VALUE: 532 Ohm
MDC IDC MSMT LEADCHNL RV IMPEDANCE VALUE: 646 Ohm
MDC IDC MSMT LEADCHNL RV PACING THRESHOLD AMPLITUDE: 0.375 V
MDC IDC MSMT LEADCHNL RV SENSING INTR AMPL: 12.375 mV
MDC IDC PG IMPLANT DT: 20170106
MDC IDC SESS DTM: 20181113081805
MDC IDC SET LEADCHNL LV PACING AMPLITUDE: 2.5 V
MDC IDC SET LEADCHNL RV PACING AMPLITUDE: 2 V
MDC IDC STAT BRADY AP VS PERCENT: 0.02 %
MDC IDC STAT BRADY AS VS PERCENT: 0.02 %
MDC IDC STAT BRADY RV PERCENT PACED: 99.93 %

## 2017-07-24 ENCOUNTER — Encounter: Payer: Self-pay | Admitting: Cardiology

## 2017-10-19 ENCOUNTER — Other Ambulatory Visit: Payer: Self-pay | Admitting: Cardiology

## 2017-10-19 NOTE — Telephone Encounter (Signed)
REFILL 

## 2017-10-20 ENCOUNTER — Ambulatory Visit (INDEPENDENT_AMBULATORY_CARE_PROVIDER_SITE_OTHER): Payer: Medicare Other | Admitting: *Deleted

## 2017-10-20 DIAGNOSIS — I428 Other cardiomyopathies: Secondary | ICD-10-CM | POA: Diagnosis not present

## 2017-10-20 NOTE — Progress Notes (Signed)
Remote ICD transmission.   

## 2017-10-21 ENCOUNTER — Encounter: Payer: Self-pay | Admitting: Cardiology

## 2017-10-29 LAB — CUP PACEART REMOTE DEVICE CHECK
Battery Remaining Longevity: 78 mo
Battery Voltage: 2.99 V
Brady Statistic AP VP Percent: 31.52 %
HIGH POWER IMPEDANCE MEASURED VALUE: 89 Ohm
HighPow Impedance: 56 Ohm
Implantable Lead Implant Date: 20050120
Implantable Lead Implant Date: 20100826
Implantable Lead Location: 753859
Implantable Lead Model: 4194
Implantable Lead Model: 5076
Implantable Lead Model: 5076
Lead Channel Impedance Value: 399 Ohm
Lead Channel Impedance Value: 817 Ohm
Lead Channel Pacing Threshold Amplitude: 0.5 V
Lead Channel Pacing Threshold Amplitude: 0.625 V
Lead Channel Pacing Threshold Amplitude: 1.125 V
Lead Channel Pacing Threshold Pulse Width: 0.4 ms
Lead Channel Pacing Threshold Pulse Width: 0.4 ms
Lead Channel Pacing Threshold Pulse Width: 0.4 ms
Lead Channel Sensing Intrinsic Amplitude: 12.5 mV
Lead Channel Sensing Intrinsic Amplitude: 3.625 mV
Lead Channel Sensing Intrinsic Amplitude: 3.625 mV
Lead Channel Setting Pacing Amplitude: 1.5 V
Lead Channel Setting Pacing Amplitude: 2 V
Lead Channel Setting Pacing Pulse Width: 0.4 ms
Lead Channel Setting Sensing Sensitivity: 0.45 mV
MDC IDC LEAD IMPLANT DT: 20050120
MDC IDC LEAD LOCATION: 753858
MDC IDC LEAD LOCATION: 753860
MDC IDC MSMT LEADCHNL LV IMPEDANCE VALUE: 304 Ohm
MDC IDC MSMT LEADCHNL LV IMPEDANCE VALUE: 646 Ohm
MDC IDC MSMT LEADCHNL RV IMPEDANCE VALUE: 551 Ohm
MDC IDC MSMT LEADCHNL RV IMPEDANCE VALUE: 665 Ohm
MDC IDC MSMT LEADCHNL RV SENSING INTR AMPL: 12.5 mV
MDC IDC PG IMPLANT DT: 20170106
MDC IDC SESS DTM: 20190212062305
MDC IDC SET LEADCHNL LV PACING AMPLITUDE: 2.25 V
MDC IDC SET LEADCHNL RV PACING PULSEWIDTH: 0.4 ms
MDC IDC STAT BRADY AP VS PERCENT: 0.02 %
MDC IDC STAT BRADY AS VP PERCENT: 68.44 %
MDC IDC STAT BRADY AS VS PERCENT: 0.02 %
MDC IDC STAT BRADY RA PERCENT PACED: 31.53 %
MDC IDC STAT BRADY RV PERCENT PACED: 99.94 %

## 2017-12-07 NOTE — Progress Notes (Signed)
Jonathon Avila Date of Birth: 25-Jul-1956   History of Present Illness: Jonathon Avila is seen  today for followup of CHF. 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 He is status post biventricular ICD. This did result in significant improvement in LV function. Echo was last check in March 2018 and was unchanged from 2014 with EF 45-50%. He underwent a generator change out of his ICD on 04/14/75 without complications. Last ICD check in February 2019 was satisfactory with normal Optivol and 100% BiV pacing. .  On follow up today he is doing well. He has recently had a strained left Achilles tendon and arthritis. This has limited his activity. Has gained 7 lbs over the holidays. Denies any increase edema.   He denies dyspnea or chest pain. No palpitations or dizziness.   Current Outpatient Medications on File Prior to Visit  Medication Sig Dispense Refill  . aspirin EC 81 MG tablet Take 1 tablet (81 mg total) by mouth daily. 90 tablet 3  . carvedilol (COREG) 25 MG tablet TAKE 1 TABLET BY MOUTH TWICE DAILY WITH A MEAL 180 tablet 0  . cholecalciferol (VITAMIN D) 1000 UNITS tablet Take 2,000 Units by mouth 2 (two) times daily.     [name] docusate sodium (COLACE) 100 MG capsule Take 1-2 capsules by mouth 2 (two) times daily as needed for constipation.    . fluticasone (FLONASE) 50 MCG/ACT nasal spray Place 2 sprays into the nose daily.    . furosemide (LASIX) 40 MG tablet Take 40 mg by mouth as directed.    [name] lisinopril (PRINIVIL,ZESTRIL) 5 MG tablet Take 5 mg by mouth daily.    [name] loratadine (CLARITIN) 10 MG tablet Take 1 tablet by mouth daily.    . montelukast (SINGULAIR) 10 MG tablet Take 10 mg by mouth at bedtime.    . simvastatin (ZOCOR) 20 MG tablet Take 1 tablet by mouth daily.    [name] spironolactone (ALDACTONE) 25 MG tablet TAKE ONE TABLET BY MOUTH AT BEDTIME 90 tablet 3   Current Facility-Administered Medications on File Prior to Visit  Medication Dose Route  Frequency Provider Last Rate Last Dose  . 0.9 %  sodium chloride infusion  500 mL Intravenous Continuous Nandigam, Venia Minks, MD        Allergies  Allergen Reactions  . Codeine     Upset stomach   . Shellfish-Derived Products Nausea And Vomiting    Past Medical History:  Diagnosis Date  . [phone] lead    6949 rate sense portion was replaced with a Medtronic 5076-lead 2010   . Allergy   . Chronic systolic CHF (congestive heart failure) ([place])   . Hyperlipidemia   . 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 LBBB (left bundle branch block)   . LV dysfunction   . Mitral insufficiency   . Nonischemic dilated cardiomyopathy ([employer])   . Pulmonary hypertension ([place])     Past Surgical History:  Procedure Laterality Date  . CARDIAC CATHETERIZATION  10/25/2001   EF 65%  . CHOLECYSTECTOMY    . EP IMPLANTABLE DEVICE N/A 09/14/2015   Procedure:  ICD Generator Changeout;  Surgeon: Deboraha Sprang, MD;  Location: [facility];  Service: Cardiovascular;  Laterality: N/A;  . ICD     BIVENTRICULAR  . US ECHOCARDIOGRAPHY  02/19/2009   EF 45%  . wisdom teeth extraxtion      Social History   Tobacco Use  Smoking Status  Never Smoker  Smokeless Tobacco Never Used    Social History   Substance and Sexual Activity  Alcohol Use No    Family History  Problem Relation Age of Onset  . COPD Mother        deceased  . Colon cancer Father        deceased age 6  . Prostate cancer Maternal Grandmother   . Esophageal cancer Neg Hx   . Pancreatic cancer Neg Hx   . Rectal cancer Neg Hx   . Stomach cancer Neg Hx     Review of Systems: As noted in history of present illness.  All other systems were reviewed and are negative.  Physical Exam: BP 122/73   Pulse 77   Ht 5\' 7"  (1.702 m)   Wt 252 lb 12.8 oz (114.7 kg)   BMI 39.59 kg/m  GENERAL:  Well appearing HEENT:  PERRL, EOMI, sclera are clear. Oropharynx is clear. NECK:  No jugular  venous distention, carotid upstroke brisk and symmetric, no bruits, no thyromegaly or adenopathy LUNGS:  Clear to auscultation bilaterally CHEST:  ICD left chest HEART:  RRR,  PMI not displaced or sustained,S1 and S2 within normal limits, no S3, no S4: no clicks, no rubs, no murmurs ABD:  Soft, nontender. BS +, no masses or bruits. No hepatomegaly, no splenomegaly EXT:  2 + pulses throughout, no edema, no cyanosis no clubbing SKIN:  Warm and dry.  No rashes NEURO:  Alert and oriented x 3. Cranial nerves II through XII intact. PSYCH:  Cognitively intact      LABORATORY DATA:  Lab Results  Component Value Date   WBC 10.0 09/11/2015   HGB 13.6 09/11/2015   HCT 40.9 09/11/2015   PLT 205 09/11/2015   GLUCOSE 97 01/20/2017   CHOL  10/21/2009    112        ATP III CLASSIFICATION:  <200     mg/dL   Desirable  200-239  mg/dL   Borderline High  >=240    mg/dL   High          TRIG 129 10/21/2009   HDL 34 (L) 10/21/2009   LDLCALC  10/21/2009    52        Total Cholesterol/HDL:CHD Risk Coronary Heart Disease Risk Table                     Men   Women  1/2 Average Risk   3.4   3.3  Average Risk       5.0   4.4  2 X Average Risk   9.6   7.1  3 X Average Risk  23.4   11.0        Use the calculated Patient Ratio above and the CHD Risk Table to determine the patient's CHD Risk.        ATP III CLASSIFICATION (LDL):  <100     mg/dL   Optimal  100-129  mg/dL   Near or Above                    Optimal  130-159  mg/dL   Borderline  160-189  mg/dL   High  >190     mg/dL   Very High   ALT 23 05/13/2015   AST 22 05/13/2015   NA 138 01/20/2017   K 4.3 01/20/2017   CL 96 01/20/2017   CREATININE 0.81 01/20/2017   BUN 13 01/20/2017   CO2 24 01/20/2017  INR 1.1 ratio (H) 04/26/2009   Labs reviewed from primary care 04/03/16: CMET and CBC normal. Cholesterol 116, trig- 132, HDL 31, LDL 59. BNP 14.1. Jul 04, 2016: A1c 5.7%. Oct 07, 2016 A1c 5.6%.  Dated 04/15/17: glucose 115. A1c 5.6%.  Otherwise CMET and CBC normal. Cholesterol 120, triglycerides 109, HDL 35, LDL 63.  Dated 10/05/17: glucose 152, otherwise CBC and CMET normal. Cholesterol 108, triglycerides 152, HDL 32, LDL 46  Ecg today shows AV sequential (BiV) pacing. Rate 77. I have personally reviewed and interpreted this study.   Echo 11/14/16: Study Conclusions  - Left ventricle: The cavity size was normal. Wall thickness was   increased in a pattern of mild LVH. Systolic function was mildly   reduced. The estimated ejection fraction was in the range of 45%   to 50%. Incoordinate septal motion. Doppler parameters are   consistent with abnormal left ventricular relaxation (grade 1   diastolic dysfunction). The E/e&' ratio is between 8-15,   suggesting indeterminate LV filling pressure. - Left atrium: The atrium was normal in size. - Right ventricle: The cavity size was normal. Wall thickness was   normal. Pacer wire or catheter noted in right ventricle. Systolic   function was normal. - Right atrium: The atrium was mildly dilated. Pacer wire or   catheter noted in right atrium. - Atrial septum: No defect or patent foramen ovale was identified. - Tricuspid valve: There was mild regurgitation. - Pulmonary arteries: PA peak pressure: 27 mm Hg (S). - Inferior vena cava: The vessel was normal in size. The   respirophasic diameter changes were in the normal range (>= 50%),   consistent with normal central venous pressure.  Impressions:  - Compared to a previous study in 2014, there has been no change.  Assessment / Plan: 1. Chronic systolic congestive heart failure. Last Ejection fraction of 45-50% in March 2018- unchanged from 2014. He has class 1-2 symptoms. He is on optimal medical therapy with an ACE inhibitor, carvedilol, and Aldactone.  He has a biventricular pacemaker in place. Continue current therapy.   2. Status post ICD/biventricular pacemaker. Continue followup in EP clinic. S/p generator change out  Jan 2017. ICD check in February  was good.    3. Obesity. Needs to focus on weight loss and less sugar in diet.   4. Left bundle branch block.  Follow up in 6 months

## 2017-12-08 ENCOUNTER — Other Ambulatory Visit: Payer: Self-pay | Admitting: Cardiology

## 2017-12-08 ENCOUNTER — Ambulatory Visit (INDEPENDENT_AMBULATORY_CARE_PROVIDER_SITE_OTHER): Payer: 59 | Admitting: Cardiology

## 2017-12-08 ENCOUNTER — Encounter: Payer: Self-pay | Admitting: Cardiology

## 2017-12-08 VITALS — BP 122/73 | HR 77 | Ht 67.0 in | Wt 252.8 lb

## 2017-12-08 DIAGNOSIS — E7800 Pure hypercholesterolemia, unspecified: Secondary | ICD-10-CM

## 2017-12-08 DIAGNOSIS — I428 Other cardiomyopathies: Secondary | ICD-10-CM

## 2017-12-08 DIAGNOSIS — I5022 Chronic systolic (congestive) heart failure: Secondary | ICD-10-CM

## 2017-12-08 DIAGNOSIS — Z95810 Presence of automatic (implantable) cardiac defibrillator: Secondary | ICD-10-CM

## 2017-12-08 DIAGNOSIS — I447 Left bundle-branch block, unspecified: Secondary | ICD-10-CM

## 2017-12-08 NOTE — Patient Instructions (Signed)
Continue your current therapy  I will see you in 6 months.   

## 2017-12-16 ENCOUNTER — Other Ambulatory Visit: Payer: Self-pay

## 2017-12-16 ENCOUNTER — Encounter (HOSPITAL_COMMUNITY): Payer: Self-pay | Admitting: Emergency Medicine

## 2017-12-16 ENCOUNTER — Emergency Department (HOSPITAL_COMMUNITY)
Admission: EM | Admit: 2017-12-16 | Discharge: 2017-12-16 | Disposition: A | Discharge status: Home / Self Care | Payer: 59 | Attending: Emergency Medicine | Admitting: Emergency Medicine

## 2017-12-16 DIAGNOSIS — R000 Tachycardia, unspecified: Secondary | ICD-10-CM | POA: Insufficient documentation

## 2017-12-16 DIAGNOSIS — Z79899 Other long term (current) drug therapy: Secondary | ICD-10-CM | POA: Insufficient documentation

## 2017-12-16 DIAGNOSIS — Z95810 Presence of automatic (implantable) cardiac defibrillator: Secondary | ICD-10-CM | POA: Insufficient documentation

## 2017-12-16 DIAGNOSIS — R55 Syncope and collapse: Secondary | ICD-10-CM | POA: Diagnosis not present

## 2017-12-16 DIAGNOSIS — I5022 Chronic systolic (congestive) heart failure: Secondary | ICD-10-CM | POA: Insufficient documentation

## 2017-12-16 DIAGNOSIS — Z7982 Long term (current) use of aspirin: Secondary | ICD-10-CM | POA: Diagnosis not present

## 2017-12-16 LAB — BASIC METABOLIC PANEL
Anion gap: 10 (ref 5–15)
BUN: 14 mg/dL (ref 6–20)
CALCIUM: 9.3 mg/dL (ref 8.9–10.3)
CO2: 24 mmol/L (ref 22–32)
CREATININE: 0.97 mg/dL (ref 0.61–1.24)
Chloride: 101 mmol/L (ref 101–111)
Glucose, Bld: 131 mg/dL — ABNORMAL HIGH (ref 65–99)
Potassium: 4.2 mmol/L (ref 3.5–5.1)
SODIUM: 135 mmol/L (ref 135–145)

## 2017-12-16 LAB — CBC
HCT: 42.6 % (ref 39.0–52.0)
Hemoglobin: 14.4 g/dL (ref 13.0–17.0)
MCH: 28.5 pg (ref 26.0–34.0)
MCHC: 33.8 g/dL (ref 30.0–36.0)
MCV: 84.4 fL (ref 78.0–100.0)
PLATELETS: 179 10*3/uL (ref 150–400)
RBC: 5.05 MIL/uL (ref 4.22–5.81)
RDW: 14.5 % (ref 11.5–15.5)
WBC: 10 10*3/uL (ref 4.0–10.5)

## 2017-12-16 LAB — I-STAT TROPONIN, ED: TROPONIN I, POC: 0.01 ng/mL (ref 0.00–0.08)

## 2017-12-16 NOTE — ED Notes (Signed)
Called medtronic to request rep. To call Dr. Regenia Skeeter

## 2017-12-16 NOTE — ED Notes (Signed)
IV removed and pt getting dressed at this time. Awaiting discharge paperwork from nurse. Pt refused wheelchair at this time.

## 2017-12-16 NOTE — ED Triage Notes (Signed)
Pt arrives via EMS from dentist office where he suddenly felt dizzy, diaphoretic and  nauseous. Pt denies any CP or LOC. 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 EMS gave 4 zofran and 150 cc of NS.

## 2017-12-16 NOTE — ED Notes (Signed)
Interrogating ICD at this time.

## 2017-12-16 NOTE — Discharge Instructions (Signed)
Call your cardiologist for a follow up appointment.

## 2017-12-16 NOTE — ED Provider Notes (Signed)
[facility] EMERGENCY DEPARTMENT Provider Note   CSN: [id number] Arrival date & time: 12/16/17  1011     History   Chief Complaint Chief Complaint  Patient presents with  . Dizziness  . Nausea    HPI Jonathon Avila is a 62 y.o. male.  HPI  62 year old male with a history of CHF and a Medtronic defibrillator presents with near syncope.  The patient states that he was going for a cleaning at the dentist today.  He took 2000 mg amoxicillin as he has been instructed about an hour before.  States he had Cheerios for breakfast.  He was sitting in the chair and then all of a sudden felt nauseated, diaphoretic, and near syncopal.  He does not think he passed out.  All the symptoms lasted around 5 minutes.  He never had chest pain, shortness of breath, headache or focal weakness.  He states he feels slightly nauseated right now but much better.  Past Medical History:  Diagnosis Date  . [phone] lead    6949 rate sense portion was replaced with a Medtronic 5076-lead 2010   . Allergy   . Chronic systolic CHF (congestive heart failure) ([place])   . Hyperlipidemia   . 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 LBBB (left bundle branch block)   . LV dysfunction   . Mitral insufficiency   . Nonischemic dilated cardiomyopathy ([place])   . Pulmonary hypertension [facility])     Patient Active Problem List   Diagnosis Date Noted  . Colon cancer screening 07/29/2016  . Family history of colon cancer 07/29/2016  . Cardiac defibrillator -CRT-Medtronic 06/10/2011  . Hyperlipidemia   . LBBB (left bundle branch block)   . Nonischemic cardiomyopathy ([place])   . CHRONIC SYSTOLIC HEART FAILURE 28/41/3244  . HYPERGLYCEMIA 02/21/2009    Past Surgical History:  Procedure Laterality Date  . CARDIAC CATHETERIZATION  10/25/2001   EF 65%  . CHOLECYSTECTOMY    . EP IMPLANTABLE DEVICE N/A 09/14/2015   Procedure:  ICD Generator Changeout;  Surgeon:  Deboraha Sprang, MD;  Location: [facility];  Service: Cardiovascular;  Laterality: N/A;  . ICD     BIVENTRICULAR  . US ECHOCARDIOGRAPHY  02/19/2009   EF 45%  . wisdom teeth extraxtion          Home Medications    Prior to Admission medications   Medication Sig Start Date End Date Taking? Authorizing Provider  aspirin EC 81 MG tablet Take 1 tablet (81 mg total) by mouth daily. 06/09/13   [place], Peter M, MD  carvedilol (COREG) 25 MG tablet TAKE 1 TABLET BY MOUTH TWICE DAILY WITH A MEAL 10/19/17   [place], Peter M, MD  cholecalciferol (VITAMIN D) 1000 UNITS tablet Take 2,000 Units by mouth 2 (two) times daily.     [provider]  docusate sodium (COLACE) 100 MG capsule Take 1-2 capsules by mouth 2 (two) times daily as needed for constipation. 07/27/14   [provider]  fluticasone (FLONASE) 50 MCG/ACT nasal spray Place 2 sprays into the nose daily.    [provider]  furosemide (LASIX) 40 MG tablet Take 40 mg by mouth as directed.    [provider]  lisinopril (PRINIVIL,ZESTRIL) 5 MG tablet Take 5 mg by mouth daily.    [provider]  loratadine (CLARITIN) 10 MG tablet Take 1 tablet by mouth daily. 12/17/15   [provider]  montelukast (SINGULAIR) 10 MG  tablet Take 10 mg by mouth at bedtime.    [provider]  simvastatin (ZOCOR) 20 MG tablet Take 1 tablet by mouth daily. 11/12/15   [provider]  spironolactone (ALDACTONE) 25 MG tablet TAKE 1 TABLET BY MOUTH AT BEDTIME 12/09/17   Troy Sine, MD    Family History Family History  Problem Relation Age of Onset  . COPD Mother        deceased  . Colon cancer Father        deceased age 6  . Prostate cancer Maternal Grandmother   . Esophageal cancer Neg Hx   . Pancreatic cancer Neg Hx   . Rectal cancer Neg Hx   . Stomach cancer Neg Hx     Social History Social History   Tobacco Use  . Smoking status: Never Smoker  . Smokeless tobacco: Never  Used  Substance Use Topics  . Alcohol use: No  . Drug use: No     Allergies   Codeine and Shellfish-derived products   Review of Systems Review of Systems  Constitutional: Positive for diaphoresis.  Respiratory: Negative for shortness of breath.   Cardiovascular: Negative for chest pain.  Gastrointestinal: Positive for nausea. Negative for abdominal pain, diarrhea and vomiting.  Neurological: Positive for light-headedness. Negative for syncope, weakness, numbness and headaches.  All other systems reviewed and are negative.    Physical Exam Updated Vital Signs BP 112/66   Pulse 69   Temp (!) 97.5 F (36.4 C) (Oral)   Resp 15   Ht 5\' 7"  (1.702 m)   Wt 110.6 kg (243 lb 12.8 oz)   SpO2 94%   BMI 38.18 kg/m   Physical Exam  Constitutional: He is oriented to person, place, and time. He appears well-developed and well-nourished. No distress.  HENT:  Head: Normocephalic and atraumatic.  Right Ear: External ear normal.  Left Ear: External ear normal.  Nose: Nose normal.  Eyes: Pupils are equal, round, and reactive to light. EOM are normal. Right eye exhibits no discharge. Left eye exhibits no discharge.  Neck: Neck supple.  Cardiovascular: Normal rate, regular rhythm and normal heart sounds.  Pulmonary/Chest: Effort normal and breath sounds normal.  Abdominal: Soft. There is no tenderness.  Musculoskeletal: He exhibits no edema.  Neurological: He is alert and oriented to person, place, and time.  CN 3-12 grossly intact. 5/5 strength in all 4 extremities. Grossly normal sensation. Normal finger to nose.   Skin: Skin is warm and dry. He is not diaphoretic.  Nursing note and vitals reviewed.    ED Treatments / Results  Labs (all labs ordered are listed, but only abnormal results are displayed) Labs Reviewed  BASIC METABOLIC PANEL - Abnormal; Notable for the following components:      Result Value   Glucose, Bld 131 (*)    All other components within normal limits    CBC  CBG MONITORING, ED  I-STAT TROPONIN, ED    EKG EKG Interpretation  Date/Time:  Wednesday December 16 2017 10:14:51 EDT Ventricular Rate:  68 PR Interval:    QRS Duration: 133 QT Interval:  438 QTC Calculation: 466 R Axis:   -96 Text Interpretation:  Sinus rhythm Nonspecific IVCD with LAD Anterolateral infarct, age indeterminate no pacemaker spikes compared to 2011 otherwise unchanged Confirmed by Sherwood Gambler [phone]) on 12/16/2017 10:39:01 AM Also confirmed by Sherwood Gambler [phone]), editor Philomena Doheny [phone])  on 12/16/2017 11:46:53 AM   Radiology No results found.  Procedures Procedures (including critical care  time)  Medications Ordered in ED Medications - No data to display   Initial Impression / Assessment and Plan / ED Course  I have reviewed the triage vital signs and the nursing notes.  Pertinent labs & imaging results that were available during my care of the patient were reviewed by me and considered in my medical decision making (see chart for details).     Patient' device was interrogated and he had about a 3-minute episode of heart rates in the 130s.  The Medtronic representative is unable to tell what rhythm it was based on his device type.  This could have been atrial fibrillation but it is obvious he hard to tell.  This also could have been symptomatic tachycardia from feeling nauseated from having taken a large dose of antibiotics.  At this point, he is feeling back to normal and I think he is stable for discharge home.  He did not have any ventricular tachycardia or obvious atrial fibrillation on his device interrogation.  Lab work and vital signs otherwise benign.  Discharge home with close cardiology follow-up and return precautions.  Final Clinical Impressions(s) / ED Diagnoses   Final diagnoses:  Near syncope  Tachycardia    ED Discharge Orders    None       Sherwood Gambler, MD 12/16/17 1600

## 2017-12-21 ENCOUNTER — Encounter: Payer: Self-pay | Admitting: Internal Medicine

## 2017-12-21 ENCOUNTER — Ambulatory Visit (INDEPENDENT_AMBULATORY_CARE_PROVIDER_SITE_OTHER): Payer: 59 | Admitting: Internal Medicine

## 2017-12-21 VITALS — BP 120/74 | HR 84 | Ht 67.0 in | Wt 252.0 lb

## 2017-12-21 DIAGNOSIS — I5022 Chronic systolic (congestive) heart failure: Secondary | ICD-10-CM

## 2017-12-21 DIAGNOSIS — I428 Other cardiomyopathies: Secondary | ICD-10-CM

## 2017-12-21 DIAGNOSIS — I447 Left bundle-branch block, unspecified: Secondary | ICD-10-CM

## 2017-12-21 DIAGNOSIS — Z95810 Presence of automatic (implantable) cardiac defibrillator: Secondary | ICD-10-CM

## 2017-12-21 LAB — CUP PACEART INCLINIC DEVICE CHECK
Battery Voltage: 2.98 V
Brady Statistic AP VS Percent: 0.02 %
Brady Statistic AS VP Percent: 63.65 %
Brady Statistic RA Percent Paced: 36.31 %
Date Time Interrogation Session: 20190415151922
HighPow Impedance: 56 Ohm
HighPow Impedance: 84 Ohm
Implantable Lead Implant Date: 20050120
Implantable Lead Location: 753860
Implantable Pulse Generator Implant Date: 20170106
Lead Channel Impedance Value: 304 Ohm
Lead Channel Impedance Value: 399 Ohm
Lead Channel Pacing Threshold Pulse Width: 0.4 ms
Lead Channel Sensing Intrinsic Amplitude: 11.625 mV
Lead Channel Sensing Intrinsic Amplitude: 5.5 mV
Lead Channel Setting Pacing Amplitude: 2.5 V
MDC IDC LEAD IMPLANT DT: 20050120
MDC IDC LEAD IMPLANT DT: 20100826
MDC IDC LEAD LOCATION: 753858
MDC IDC LEAD LOCATION: 753859
MDC IDC MSMT BATTERY REMAINING LONGEVITY: 72 mo
MDC IDC MSMT LEADCHNL LV IMPEDANCE VALUE: 703 Ohm
MDC IDC MSMT LEADCHNL LV IMPEDANCE VALUE: 779 Ohm
MDC IDC MSMT LEADCHNL LV PACING THRESHOLD AMPLITUDE: 1.25 V
MDC IDC MSMT LEADCHNL LV PACING THRESHOLD PULSEWIDTH: 0.4 ms
MDC IDC MSMT LEADCHNL RA PACING THRESHOLD AMPLITUDE: 0.625 V
MDC IDC MSMT LEADCHNL RA PACING THRESHOLD PULSEWIDTH: 0.4 ms
MDC IDC MSMT LEADCHNL RA SENSING INTR AMPL: 3.375 mV
MDC IDC MSMT LEADCHNL RV IMPEDANCE VALUE: 589 Ohm
MDC IDC MSMT LEADCHNL RV IMPEDANCE VALUE: 703 Ohm
MDC IDC MSMT LEADCHNL RV PACING THRESHOLD AMPLITUDE: 0.5 V
MDC IDC MSMT LEADCHNL RV SENSING INTR AMPL: 15.125 mV
MDC IDC SET LEADCHNL LV PACING PULSEWIDTH: 0.4 ms
MDC IDC SET LEADCHNL RA PACING AMPLITUDE: 1.5 V
MDC IDC SET LEADCHNL RV PACING AMPLITUDE: 2 V
MDC IDC SET LEADCHNL RV PACING PULSEWIDTH: 0.4 ms
MDC IDC SET LEADCHNL RV SENSING SENSITIVITY: 0.45 mV
MDC IDC STAT BRADY AP VP PERCENT: 36.29 %
MDC IDC STAT BRADY AS VS PERCENT: 0.04 %
MDC IDC STAT BRADY RV PERCENT PACED: 99.93 %

## 2017-12-21 NOTE — Patient Instructions (Addendum)
Medication Instructions:  Your physician recommends that you continue on your current medications as directed. Please refer to the Current Medication list given to you today.  Labwork: None ordered.  Testing/Procedures: None ordered.  Follow-Up: Your physician recommends that you schedule a follow-up appointment in:  In January 2020 with Renee   Remote monitoring is used to monitor your Pacemaker from home. This monitoring reduces the number of office visits required to check your device to one time per year. It allows [place] to keep an eye on the functioning of your device to ensure it is working properly. You are scheduled for a device check from home on 5/14. You may send your transmission at any time that day. If you have a wireless device, the transmission will be sent automatically. After your physician reviews your transmission, you will receive a postcard with your next transmission date.   Any Other Special Instructions Will Be Listed Below (If Applicable).     If you need a refill on your cardiac medications before your next appointment, please call your pharmacy.

## 2017-12-21 NOTE — Progress Notes (Signed)
HPI  Jonathon Avila is a 62 y.o. male seen in followup for a CRT-D. implanted for congestive heart failure in the setting of nonischemic heart disease. He is doing pretty well. He remains class II.  He had a 6949 lead replaced with 5076; generator replacement 1/17. Ventolin inhaler symptoms are increasing    He saw Dr. Shirlee More 2/18. Symptoms were stable. Echocardiogram was ordered.   DATE TEST    10/14    Echo   EF 45-50 %   3/18    Echo   EF 45-50 %           Date Cr K Mg  5/17  0.7 4.2    4/19  0.97 4.2     He is otherwise well.  He was prescribed, for reasons not clear to me, prophylactic antibiotics.  On this particular day, 4/10, he took all 4 pills and became nauseated.  He was then hypotensive and tachycardic.  Interrogation of his device is noted below.  Symptoms gradually abated.  ECG in the ER was reviewed and was normal.    Past Medical History:  Diagnosis Date  . [phone] lead    6949 rate sense portion was replaced with a Medtronic 5076-lead 2010   . Allergy   . Chronic systolic CHF (congestive heart failure) ([place])   . Hyperlipidemia   . 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 LBBB (left bundle branch block)   . LV dysfunction   . Mitral insufficiency   . Nonischemic dilated cardiomyopathy ([place])   . Pulmonary hypertension ([place])     Past Surgical History:  Procedure Laterality Date  . CARDIAC CATHETERIZATION  10/25/2001   EF 65%  . CHOLECYSTECTOMY    . EP IMPLANTABLE DEVICE N/A 09/14/2015   Procedure:  ICD Generator Changeout;  Surgeon: Deboraha Sprang, MD;  Location: [facility];  Service: Cardiovascular;  Laterality: N/A;  . ICD     BIVENTRICULAR  . US ECHOCARDIOGRAPHY  02/19/2009   EF 45%  . wisdom teeth extraxtion      Current Outpatient Medications  Medication Sig Dispense Refill  . aspirin EC 81 MG tablet Take 1 tablet (81 mg total) by mouth daily. 90 tablet 3  . carvedilol (COREG) 25 MG tablet  TAKE 1 TABLET BY MOUTH TWICE DAILY WITH A MEAL 180 tablet 0  . cholecalciferol (VITAMIN D) 1000 UNITS tablet Take 2,000 Units by mouth 2 (two) times daily.     [name] docusate sodium (COLACE) 100 MG capsule Take 1-2 capsules by mouth 2 (two) times daily as needed for constipation.    . fluticasone (FLONASE) 50 MCG/ACT nasal spray Place 2 sprays into the nose daily.    . furosemide (LASIX) 40 MG tablet Take 40 mg by mouth as directed.    [name] lisinopril (PRINIVIL,ZESTRIL) 5 MG tablet Take 5 mg by mouth daily.    [name] loratadine (CLARITIN) 10 MG tablet Take 1 tablet by mouth daily.    . montelukast (SINGULAIR) 10 MG tablet Take 10 mg by mouth at bedtime.    . simvastatin (ZOCOR) 20 MG tablet Take 1 tablet by mouth daily.    [name] spironolactone (ALDACTONE) 25 MG tablet TAKE 1 TABLET BY MOUTH AT BEDTIME 90 tablet 1   Current Facility-Administered Medications  Medication Dose Route Frequency Provider Last Rate Last Dose  . 0.9 %  sodium chloride infusion  500 mL Intravenous Continuous Nandigam, Venia Minks, MD  Allergies  Allergen Reactions  . Codeine     Upset stomach   . Shellfish-Derived Products Nausea And Vomiting    Review of Systems negative except from HPI and PMH  Physical Exam BP 120/74   Pulse 84   Ht 5\' 7"  (1.702 m)   Wt 252 lb (114.3 kg)   SpO2 95%   BMI 39.47 kg/m  Well developed and nourished in no acute distress HENT normal Neck supple with JVP-flat Clear Regular rate and rhythm, no murmurs or gallops Abd-soft with active BS No Clubbing cyanosis edema Skin-warm and dry A & Oriented  Grossly normal sensory and motor function   ECG demonstrates AV pacing  Assessment and  Plan  Nonischemic cardiomyopathy with interval normalization    6949-lead replaced with a 4098  Systolic heart failure chronic class 2  High Risk Medication Surveillance  Presyncope associated with amoxicillin  Implantable defibrillator-CRT-Medtronic The patient's device was interrogated.   The information was reviewed. No changes were made in the programming.        I suspect his episode was triggered by nausea associated with a high-dose amoxicillin.  First, I do not think there is an indication for prophylactic antibiotics.  Second, I do not think that this was an allergic reaction.  Third, device interrogation demonstrates sinus tachycardia with PVCs either in couplets or interpolated.  Euvolemic continue current meds  We spent more than 50% of our >25 min visit in face to face counseling regarding the above

## 2018-01-11 DIAGNOSIS — M1990 Unspecified osteoarthritis, unspecified site: Secondary | ICD-10-CM | POA: Insufficient documentation

## 2018-01-18 ENCOUNTER — Other Ambulatory Visit: Payer: Self-pay | Admitting: Cardiology

## 2018-01-18 NOTE — Telephone Encounter (Signed)
Rx has been sent to the pharmacy electronically. ° °

## 2018-01-19 ENCOUNTER — Ambulatory Visit (INDEPENDENT_AMBULATORY_CARE_PROVIDER_SITE_OTHER): Payer: 59 | Admitting: *Deleted

## 2018-01-19 DIAGNOSIS — I5022 Chronic systolic (congestive) heart failure: Secondary | ICD-10-CM

## 2018-01-19 DIAGNOSIS — I428 Other cardiomyopathies: Secondary | ICD-10-CM

## 2018-01-20 NOTE — Progress Notes (Signed)
Remote ICD transmission.   

## 2018-01-21 ENCOUNTER — Encounter: Payer: Self-pay | Admitting: Cardiology

## 2018-01-21 LAB — CUP PACEART REMOTE DEVICE CHECK
Battery Remaining Longevity: 69 mo
Battery Voltage: 2.98 V
Brady Statistic AP VP Percent: 26.68 %
Brady Statistic AP VS Percent: 0.02 %
Brady Statistic AS VP Percent: 73.27 %
Brady Statistic AS VS Percent: 0.03 %
Brady Statistic RA Percent Paced: 26.7 %
Brady Statistic RV Percent Paced: 99.93 %
Date Time Interrogation Session: 20190514041802
HighPow Impedance: 54 Ohm
HighPow Impedance: 82 Ohm
Implantable Lead Implant Date: 20050120
Implantable Lead Implant Date: 20050120
Implantable Lead Implant Date: 20100826
Implantable Lead Location: 753858
Implantable Lead Location: 753859
Implantable Lead Location: 753860
Implantable Lead Model: 4194
Implantable Lead Model: 5076
Implantable Lead Model: 5076
Implantable Pulse Generator Implant Date: 20170106
Lead Channel Impedance Value: 304 Ohm
Lead Channel Impedance Value: 361 Ohm
Lead Channel Impedance Value: 551 Ohm
Lead Channel Impedance Value: 608 Ohm
Lead Channel Impedance Value: 665 Ohm
Lead Channel Impedance Value: 779 Ohm
Lead Channel Pacing Threshold Amplitude: 0.5 V
Lead Channel Pacing Threshold Amplitude: 0.75 V
Lead Channel Pacing Threshold Amplitude: 1.5 V
Lead Channel Pacing Threshold Pulse Width: 0.4 ms
Lead Channel Pacing Threshold Pulse Width: 0.4 ms
Lead Channel Pacing Threshold Pulse Width: 0.4 ms
Lead Channel Sensing Intrinsic Amplitude: 11.625 mV
Lead Channel Sensing Intrinsic Amplitude: 11.625 mV
Lead Channel Sensing Intrinsic Amplitude: 3.375 mV
Lead Channel Sensing Intrinsic Amplitude: 3.375 mV
Lead Channel Setting Pacing Amplitude: 1.5 V
Lead Channel Setting Pacing Amplitude: 2 V
Lead Channel Setting Pacing Amplitude: 2.5 V
Lead Channel Setting Pacing Pulse Width: 0.4 ms
Lead Channel Setting Pacing Pulse Width: 0.4 ms
Lead Channel Setting Sensing Sensitivity: 0.45 mV

## 2018-02-04 ENCOUNTER — Encounter: Payer: Medicare Other | Admitting: Nurse Practitioner

## 2018-04-20 ENCOUNTER — Ambulatory Visit (INDEPENDENT_AMBULATORY_CARE_PROVIDER_SITE_OTHER): Payer: 59 | Admitting: *Deleted

## 2018-04-20 DIAGNOSIS — I428 Other cardiomyopathies: Secondary | ICD-10-CM

## 2018-04-20 DIAGNOSIS — I5022 Chronic systolic (congestive) heart failure: Secondary | ICD-10-CM

## 2018-04-20 NOTE — Progress Notes (Signed)
Remote ICD transmission.   

## 2018-05-15 LAB — CUP PACEART REMOTE DEVICE CHECK
Brady Statistic AP VS Percent: 0.02 %
Brady Statistic AS VP Percent: 72.74 %
Brady Statistic AS VS Percent: 0.03 %
Brady Statistic RA Percent Paced: 27.23 %
HIGH POWER IMPEDANCE MEASURED VALUE: 85 Ohm
HighPow Impedance: 57 Ohm
Implantable Lead Implant Date: 20050120
Implantable Lead Implant Date: 20050120
Implantable Lead Location: 753859
Implantable Lead Location: 753860
Implantable Lead Model: 5076
Implantable Pulse Generator Implant Date: 20170106
Lead Channel Impedance Value: 304 Ohm
Lead Channel Impedance Value: 665 Ohm
Lead Channel Impedance Value: 722 Ohm
Lead Channel Pacing Threshold Pulse Width: 0.4 ms
Lead Channel Pacing Threshold Pulse Width: 0.4 ms
Lead Channel Sensing Intrinsic Amplitude: 12.75 mV
Lead Channel Sensing Intrinsic Amplitude: 12.75 mV
Lead Channel Sensing Intrinsic Amplitude: 3.875 mV
Lead Channel Sensing Intrinsic Amplitude: 3.875 mV
Lead Channel Setting Pacing Amplitude: 1.5 V
Lead Channel Setting Pacing Amplitude: 2 V
Lead Channel Setting Pacing Amplitude: 2.25 V
Lead Channel Setting Pacing Pulse Width: 0.4 ms
Lead Channel Setting Sensing Sensitivity: 0.45 mV
MDC IDC LEAD IMPLANT DT: 20100826
MDC IDC LEAD LOCATION: 753858
MDC IDC MSMT BATTERY REMAINING LONGEVITY: 70 mo
MDC IDC MSMT BATTERY VOLTAGE: 2.98 V
MDC IDC MSMT LEADCHNL LV IMPEDANCE VALUE: 874 Ohm
MDC IDC MSMT LEADCHNL LV PACING THRESHOLD AMPLITUDE: 1.25 V
MDC IDC MSMT LEADCHNL RA IMPEDANCE VALUE: 418 Ohm
MDC IDC MSMT LEADCHNL RA PACING THRESHOLD AMPLITUDE: 0.75 V
MDC IDC MSMT LEADCHNL RV IMPEDANCE VALUE: 589 Ohm
MDC IDC MSMT LEADCHNL RV PACING THRESHOLD AMPLITUDE: 0.5 V
MDC IDC MSMT LEADCHNL RV PACING THRESHOLD PULSEWIDTH: 0.4 ms
MDC IDC SESS DTM: 20190813073423
MDC IDC SET LEADCHNL LV PACING PULSEWIDTH: 0.4 ms
MDC IDC STAT BRADY AP VP PERCENT: 27.21 %
MDC IDC STAT BRADY RV PERCENT PACED: 99.92 %

## 2018-05-31 NOTE — Progress Notes (Signed)
Jonathon Avila Date of Birth: 31-Mar-1956   History of Present Illness: Jonathon Avila is seen  today for followup of CHF. 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 He is status post biventricular ICD. This did result in significant improvement in LV function. Echo was last check in March 2018 and was unchanged from 2014 with EF 45-50%. He underwent a generator change out of his ICD on 03/10/40 without complications. Last ICD check in August 2019 was satisfactory with normal Optivol and 99% BiV pacing. .  On follow up today he is doing well. 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 He is planning on joining a gym in January with [employer] program. He has lost 8 lbs.  Denies any increase edema.   He denies dyspnea or chest pain. No palpitations or dizziness.   Current Outpatient Medications on File Prior to Visit  Medication Sig Dispense Refill  . aspirin EC 81 MG tablet Take 1 tablet (81 mg total) by mouth daily. 90 tablet 3  . carvedilol (COREG) 25 MG tablet TAKE 1 TABLET BY MOUTH TWICE DAILY WITH A MEAL 180 tablet 2  . cholecalciferol (VITAMIN D) 1000 UNITS tablet Take 2,000 Units by mouth 2 (two) times daily.     [name] docusate sodium (COLACE) 100 MG capsule Take 1-2 capsules by mouth 2 (two) times daily as needed for constipation.    . fluticasone (FLONASE) 50 MCG/ACT nasal spray Place 2 sprays into the nose daily.    . furosemide (LASIX) 40 MG tablet Take 40 mg by mouth as directed.    [name] lisinopril (PRINIVIL,ZESTRIL) 5 MG tablet Take 5 mg by mouth daily.    [name] loratadine (CLARITIN) 10 MG tablet Take 1 tablet by mouth daily.    . montelukast (SINGULAIR) 10 MG tablet Take 10 mg by mouth at bedtime.    . simvastatin (ZOCOR) 20 MG tablet Take 1 tablet by mouth daily.    [name] spironolactone (ALDACTONE) 25 MG tablet TAKE 1 TABLET BY MOUTH AT BEDTIME 90 tablet 1   Current Facility-Administered Medications on File Prior to  Visit  Medication Dose Route Frequency Provider Last Rate Last Dose  . 0.9 %  sodium chloride infusion  500 mL Intravenous Continuous Nandigam, Venia Minks, MD        Allergies  Allergen Reactions  . Penicillins Anaphylaxis    Other reaction(s): Hypotension  . Codeine     Upset stomach   . Shellfish-Derived Products Nausea And Vomiting    Past Medical History:  Diagnosis Date  . [phone] lead    6949 rate sense portion was replaced with a Medtronic 5076-lead 2010   . Allergy   . Chronic systolic CHF (congestive heart failure) ([place])   . Hyperlipidemia   . 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 LBBB (left bundle branch block)   . LV dysfunction   . Mitral insufficiency   . Nonischemic dilated cardiomyopathy ([place])   . Pulmonary hypertension ([place])     Past Surgical History:  Procedure Laterality Date  . CARDIAC CATHETERIZATION  10/25/2001   EF 65%  . CHOLECYSTECTOMY    . EP IMPLANTABLE DEVICE N/A 09/14/2015   Procedure:  ICD Generator Changeout;  Surgeon: Deboraha Sprang, MD;  Location: [facility];  Service: Cardiovascular;  Laterality: N/A;  . ICD     BIVENTRICULAR  . US ECHOCARDIOGRAPHY  02/19/2009   EF  45%  . wisdom teeth extraxtion      Social History   Tobacco Use  Smoking Status Never Smoker  Smokeless Tobacco Never Used    Social History   Substance and Sexual Activity  Alcohol Use No    Family History  Problem Relation Age of Onset  . COPD Mother        deceased  . Colon cancer Father        deceased age 41  . Prostate cancer Maternal Grandmother   . Esophageal cancer Neg Hx   . Pancreatic cancer Neg Hx   . Rectal cancer Neg Hx   . Stomach cancer Neg Hx     Review of Systems: As noted in history of present illness.  All other systems were reviewed and are negative.  Physical Exam: BP 107/69   Pulse 84   Ht 5\' 7"  (1.702 m)   Wt 244 lb 3.2 oz (110.8 kg)   SpO2 99%   BMI 38.25 kg/m   GENERAL:  Well appearing WM in NAD HEENT:  PERRL, EOMI, sclera are clear. Oropharynx is clear. NECK:  No jugular venous distention, carotid upstroke brisk and symmetric, no bruits, no thyromegaly or adenopathy LUNGS:  Clear to auscultation bilaterally CHEST:  Unremarkable, ICD in left upper chest HEART:  RRR,  PMI not displaced or sustained,S1 and S2 within normal limits, no S3, no S4: no clicks, no rubs, no murmurs ABD:  Soft, nontender. BS +, no masses or bruits. No hepatomegaly, no splenomegaly EXT:  2 + pulses throughout, no edema, no cyanosis no clubbing SKIN:  Warm and dry.  No rashes NEURO:  Alert and oriented x 3. Cranial nerves II through XII intact. PSYCH:  Cognitively intact        LABORATORY DATA:  Lab Results  Component Value Date   WBC 10.0 12/16/2017   HGB 14.4 12/16/2017   HCT 42.6 12/16/2017   PLT 179 12/16/2017   GLUCOSE 131 (H) 12/16/2017   CHOL  10/21/2009    112        ATP III CLASSIFICATION:  <200     mg/dL   Desirable  200-239  mg/dL   Borderline High  >=240    mg/dL   High          TRIG 129 10/21/2009   HDL 34 (L) 10/21/2009   LDLCALC  10/21/2009    52        Total Cholesterol/HDL:CHD Risk Coronary Heart Disease Risk Table                     Men   Women  1/2 Average Risk   3.4   3.3  Average Risk       5.0   4.4  2 X Average Risk   9.6   7.1  3 X Average Risk  23.4   11.0        Use the calculated Patient Ratio above and the CHD Risk Table to determine the patient's CHD Risk.        ATP III CLASSIFICATION (LDL):  <100     mg/dL   Optimal  100-129  mg/dL   Near or Above                    Optimal  130-159  mg/dL   Borderline  160-189  mg/dL   High  >190     mg/dL   Very High   ALT 23 05/13/2015  AST 22 05/13/2015   NA 135 12/16/2017   K 4.2 12/16/2017   CL 101 12/16/2017   CREATININE 0.97 12/16/2017   BUN 14 12/16/2017   CO2 24 12/16/2017   INR 1.1 ratio (H) 04/26/2009   Labs reviewed from primary care 04/03/16: CMET and CBC  normal. Cholesterol 116, trig- 132, HDL 31, LDL 59. BNP 14.1. Jul 04, 2016: A1c 5.7%. Oct 07, 2016 A1c 5.6%.  Dated 04/15/17: glucose 115. A1c 5.6%. Otherwise CMET and CBC normal. Cholesterol 120, triglycerides 109, HDL 35, LDL 63.  Dated 10/05/17: glucose 152, otherwise CBC and CMET normal. Cholesterol 108, triglycerides 152, HDL 32, LDL 46 Dated 04/14/18: cholesterol 116, triglycerides 114, HDL 32, LDL 61. CMET normal.   Echo 11/14/16: Study Conclusions  - Left ventricle: The cavity size was normal. Wall thickness was   increased in a pattern of mild LVH. Systolic function was mildly   reduced. The estimated ejection fraction was in the range of 45%   to 50%. Incoordinate septal motion. Doppler parameters are   consistent with abnormal left ventricular relaxation (grade 1   diastolic dysfunction). The E/e&' ratio is between 8-15,   suggesting indeterminate LV filling pressure. - Left atrium: The atrium was normal in size. - Right ventricle: The cavity size was normal. Wall thickness was   normal. Pacer wire or catheter noted in right ventricle. Systolic   function was normal. - Right atrium: The atrium was mildly dilated. Pacer wire or   catheter noted in right atrium. - Atrial septum: No defect or patent foramen ovale was identified. - Tricuspid valve: There was mild regurgitation. - Pulmonary arteries: PA peak pressure: 27 mm Hg (S). - Inferior vena cava: The vessel was normal in size. The   respirophasic diameter changes were in the normal range (>= 50%),   consistent with normal central venous pressure.  Impressions:  - Compared to a previous study in 2014, there has been no change.  Assessment / Plan: 1. Chronic systolic congestive heart failure. Last Ejection fraction of 45-50% in March 2018- unchanged from 2014. He has class 1-2 symptoms. He is on optimal medical therapy with an ACE inhibitor, carvedilol, and Aldactone.  He has a biventricular pacemaker in place. Continue  current therapy. I am pleased with his weight loss. Encourage increase in aerobic activity.  2. Status post ICD/biventricular pacemaker. Continue followup in EP clinic. S/p generator change out Jan 2017. ICD check in August was good.    3. Obesity.he has lost weight. Encourage dietary modification.  4. Left bundle branch block.  Follow up in 6 months

## 2018-06-03 ENCOUNTER — Encounter: Payer: Self-pay | Admitting: Cardiology

## 2018-06-03 ENCOUNTER — Ambulatory Visit (INDEPENDENT_AMBULATORY_CARE_PROVIDER_SITE_OTHER): Payer: 59 | Admitting: Cardiology

## 2018-06-03 VITALS — BP 107/69 | HR 84 | Ht 67.0 in | Wt 244.2 lb

## 2018-06-03 DIAGNOSIS — I5022 Chronic systolic (congestive) heart failure: Secondary | ICD-10-CM | POA: Diagnosis not present

## 2018-06-03 DIAGNOSIS — E7800 Pure hypercholesterolemia, unspecified: Secondary | ICD-10-CM

## 2018-06-03 DIAGNOSIS — I428 Other cardiomyopathies: Secondary | ICD-10-CM | POA: Diagnosis not present

## 2018-06-03 DIAGNOSIS — Z95810 Presence of automatic (implantable) cardiac defibrillator: Secondary | ICD-10-CM

## 2018-06-03 DIAGNOSIS — I447 Left bundle-branch block, unspecified: Secondary | ICD-10-CM

## 2018-06-03 NOTE — Patient Instructions (Signed)
Continue your current therapy  I will see you in 6 months.   

## 2018-06-04 DIAGNOSIS — I272 Pulmonary hypertension, unspecified: Secondary | ICD-10-CM | POA: Insufficient documentation

## 2018-06-04 DIAGNOSIS — I340 Nonrheumatic mitral (valve) insufficiency: Secondary | ICD-10-CM | POA: Insufficient documentation

## 2018-06-14 ENCOUNTER — Other Ambulatory Visit: Payer: Self-pay | Admitting: Cardiovascular Disease

## 2018-06-18 ENCOUNTER — Telehealth: Payer: Self-pay | Admitting: Cardiology

## 2018-06-18 NOTE — Telephone Encounter (Signed)
New Message   Pt states that he is calling to check on if Dr. Martinique was able to look over the documents that he dropped of last week. Please call

## 2018-06-18 NOTE — Telephone Encounter (Signed)
Spoke with pt. Pt  sts that he dropped off a copy of his [facility] records, he would like Dr.Jordan to review and advise. Adv pt that Dr.Jordan is out of the office and will fwd a message to Dr.Jordanb and his nurse Malachy Mood to f/u with the pt.  Pt agreeable with plan.

## 2018-06-24 NOTE — Telephone Encounter (Signed)
Spoke to patient 06/21/18.Dr.Jordan reviewed records you brought to office.Letter was done as requested left at [facility] office front desk.

## 2018-07-20 ENCOUNTER — Ambulatory Visit (INDEPENDENT_AMBULATORY_CARE_PROVIDER_SITE_OTHER): Payer: 59 | Admitting: *Deleted

## 2018-07-20 DIAGNOSIS — I428 Other cardiomyopathies: Secondary | ICD-10-CM | POA: Diagnosis not present

## 2018-07-20 DIAGNOSIS — I5022 Chronic systolic (congestive) heart failure: Secondary | ICD-10-CM

## 2018-07-20 NOTE — Progress Notes (Signed)
Remote ICD transmission.   

## 2018-09-03 ENCOUNTER — Other Ambulatory Visit: Payer: Self-pay | Admitting: Cardiology

## 2018-09-19 LAB — CUP PACEART REMOTE DEVICE CHECK
Battery Remaining Longevity: 59 mo
Battery Voltage: 2.97 V
Brady Statistic AP VS Percent: 0.03 %
Brady Statistic RA Percent Paced: 39.25 %
Brady Statistic RV Percent Paced: 99.93 %
Date Time Interrogation Session: 20191112051606
HIGH POWER IMPEDANCE MEASURED VALUE: 52 Ohm
HIGH POWER IMPEDANCE MEASURED VALUE: 78 Ohm
Implantable Lead Implant Date: 20050120
Implantable Lead Implant Date: 20050120
Implantable Lead Implant Date: 20100826
Implantable Lead Location: 753858
Implantable Lead Location: 753860
Implantable Lead Model: 4194
Implantable Lead Model: 5076
Implantable Pulse Generator Implant Date: 20170106
Lead Channel Impedance Value: 285 Ohm
Lead Channel Impedance Value: 418 Ohm
Lead Channel Impedance Value: 589 Ohm
Lead Channel Impedance Value: 665 Ohm
Lead Channel Pacing Threshold Pulse Width: 0.4 ms
Lead Channel Pacing Threshold Pulse Width: 0.4 ms
Lead Channel Pacing Threshold Pulse Width: 0.4 ms
Lead Channel Sensing Intrinsic Amplitude: 12.75 mV
Lead Channel Sensing Intrinsic Amplitude: 12.75 mV
Lead Channel Setting Pacing Amplitude: 1.5 V
Lead Channel Setting Pacing Amplitude: 2.75 V
Lead Channel Setting Pacing Pulse Width: 0.4 ms
Lead Channel Setting Pacing Pulse Width: 0.4 ms
MDC IDC LEAD LOCATION: 753859
MDC IDC MSMT LEADCHNL LV IMPEDANCE VALUE: 760 Ohm
MDC IDC MSMT LEADCHNL LV PACING THRESHOLD AMPLITUDE: 1.125 V
MDC IDC MSMT LEADCHNL RA PACING THRESHOLD AMPLITUDE: 0.625 V
MDC IDC MSMT LEADCHNL RA SENSING INTR AMPL: 3.25 mV
MDC IDC MSMT LEADCHNL RA SENSING INTR AMPL: 3.25 mV
MDC IDC MSMT LEADCHNL RV IMPEDANCE VALUE: 532 Ohm
MDC IDC MSMT LEADCHNL RV PACING THRESHOLD AMPLITUDE: 0.5 V
MDC IDC SET LEADCHNL RV PACING AMPLITUDE: 2 V
MDC IDC SET LEADCHNL RV SENSING SENSITIVITY: 0.45 mV
MDC IDC STAT BRADY AP VP PERCENT: 39.23 %
MDC IDC STAT BRADY AS VP PERCENT: 60.73 %
MDC IDC STAT BRADY AS VS PERCENT: 0.02 %

## 2018-09-24 ENCOUNTER — Encounter: Payer: Self-pay | Admitting: Internal Medicine

## 2018-09-24 ENCOUNTER — Ambulatory Visit (INDEPENDENT_AMBULATORY_CARE_PROVIDER_SITE_OTHER): Payer: Managed Care, Other (non HMO) | Admitting: Internal Medicine

## 2018-09-24 VITALS — BP 110/70 | HR 82 | Ht 67.0 in | Wt 250.6 lb

## 2018-09-24 DIAGNOSIS — I5022 Chronic systolic (congestive) heart failure: Secondary | ICD-10-CM

## 2018-09-24 DIAGNOSIS — I447 Left bundle-branch block, unspecified: Secondary | ICD-10-CM | POA: Diagnosis not present

## 2018-09-24 DIAGNOSIS — I428 Other cardiomyopathies: Secondary | ICD-10-CM

## 2018-09-24 DIAGNOSIS — Z95810 Presence of automatic (implantable) cardiac defibrillator: Secondary | ICD-10-CM | POA: Diagnosis not present

## 2018-09-24 LAB — CUP PACEART INCLINIC DEVICE CHECK
Battery Remaining Longevity: 61 mo
Battery Voltage: 2.97 V
Brady Statistic AP VP Percent: 32.87 %
Brady Statistic AS VP Percent: 67.08 %
Brady Statistic RA Percent Paced: 32.89 %
Brady Statistic RV Percent Paced: 99.91 %
HighPow Impedance: 58 Ohm
HighPow Impedance: 88 Ohm
Implantable Lead Implant Date: 20100826
Implantable Lead Location: 753858
Implantable Lead Location: 753859
Implantable Lead Model: 4194
Implantable Lead Model: 5076
Implantable Lead Model: 5076
Implantable Pulse Generator Implant Date: 20170106
Lead Channel Impedance Value: 418 Ohm
Lead Channel Impedance Value: 608 Ohm
Lead Channel Impedance Value: 722 Ohm
Lead Channel Impedance Value: 836 Ohm
Lead Channel Pacing Threshold Amplitude: 0.5 V
Lead Channel Pacing Threshold Amplitude: 0.75 V
Lead Channel Pacing Threshold Pulse Width: 0.4 ms
Lead Channel Pacing Threshold Pulse Width: 0.4 ms
Lead Channel Sensing Intrinsic Amplitude: 13.625 mV
Lead Channel Sensing Intrinsic Amplitude: 3.5 mV
Lead Channel Setting Pacing Amplitude: 1.5 V
Lead Channel Setting Pacing Pulse Width: 0.4 ms
Lead Channel Setting Sensing Sensitivity: 0.45 mV
MDC IDC LEAD IMPLANT DT: 20050120
MDC IDC LEAD IMPLANT DT: 20050120
MDC IDC LEAD LOCATION: 753860
MDC IDC MSMT LEADCHNL LV IMPEDANCE VALUE: 304 Ohm
MDC IDC MSMT LEADCHNL LV PACING THRESHOLD AMPLITUDE: 1.25 V
MDC IDC MSMT LEADCHNL LV PACING THRESHOLD PULSEWIDTH: 0.4 ms
MDC IDC MSMT LEADCHNL RA SENSING INTR AMPL: 4.125 mV
MDC IDC MSMT LEADCHNL RV IMPEDANCE VALUE: 589 Ohm
MDC IDC MSMT LEADCHNL RV SENSING INTR AMPL: 12.875 mV
MDC IDC SESS DTM: 20200117114530
MDC IDC SET LEADCHNL LV PACING AMPLITUDE: 2.5 V
MDC IDC SET LEADCHNL RV PACING AMPLITUDE: 2 V
MDC IDC SET LEADCHNL RV PACING PULSEWIDTH: 0.4 ms
MDC IDC STAT BRADY AP VS PERCENT: 0.02 %
MDC IDC STAT BRADY AS VS PERCENT: 0.03 %

## 2018-09-24 NOTE — Progress Notes (Signed)
HPI  Jonathon Avila is a 63 y.o. male seen in followup for a CRT-D. implanted for congestive heart failure in the setting of nonischemic heart disease. He is doing pretty well. He remains class II.  He had a 6949 lead replaced with 5076; generator replacement 1/17. Ventolin inhaler symptoms are increasing    DATE TEST    10/14    Echo   EF 45-50 %   3/18    Echo   EF 45-50 %           Date Cr K Mg  5/17  0.7 4.2    4/19  0.97 4.2          The patient denies chest pain, shortness of breath, nocturnal dyspnea, orthopnea or peripheral edema.  There have been no palpitations, lightheadedness or syncope.   At the gym       Past Medical History:  Diagnosis Date  . [phone] lead    6949 rate sense portion was replaced with a Medtronic 5076-lead 2010   . Allergy   . Chronic systolic CHF (congestive heart failure) ([place])   . Hyperlipidemia   . 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 LBBB (left bundle branch block)   . LV dysfunction   . Mitral insufficiency   . Nonischemic dilated cardiomyopathy ([place])   . Pulmonary hypertension ([place])     Past Surgical History:  Procedure Laterality Date  . CARDIAC CATHETERIZATION  10/25/2001   EF 65%  . CHOLECYSTECTOMY    . EP IMPLANTABLE DEVICE N/A 09/14/2015   Procedure:  ICD Generator Changeout;  Surgeon: Deboraha Sprang, MD;  Location: [facility];  Service: Cardiovascular;  Laterality: N/A;  . ICD     BIVENTRICULAR  . US ECHOCARDIOGRAPHY  02/19/2009   EF 45%  . wisdom teeth extraxtion      Current Outpatient Medications  Medication Sig Dispense Refill  . aspirin EC 81 MG tablet Take 1 tablet (81 mg total) by mouth daily. 90 tablet 3  . carvedilol (COREG) 25 MG tablet TAKE 1 TABLET BY MOUTH TWICE DAILY WITH A MEAL 180 tablet 2  . cholecalciferol (VITAMIN D) 1000 UNITS tablet Take 2,000 Units by mouth 2 (two) times daily.     [name] docusate sodium (COLACE) 100 MG capsule Take 1-2  capsules by mouth 2 (two) times daily as needed for constipation.    . fluticasone (FLONASE) 50 MCG/ACT nasal spray Place 2 sprays into the nose daily.    . furosemide (LASIX) 40 MG tablet TAKE ONE TABLET BY MOUTH AS NEEDED 30 tablet 1  . lisinopril (PRINIVIL,ZESTRIL) 5 MG tablet Take 5 mg by mouth daily.    [name] loratadine (CLARITIN) 10 MG tablet Take 1 tablet by mouth daily.    . montelukast (SINGULAIR) 10 MG tablet Take 10 mg by mouth at bedtime.    . simvastatin (ZOCOR) 20 MG tablet Take 1 tablet by mouth daily.    [name] spironolactone (ALDACTONE) 25 MG tablet TAKE 1 TABLET BY MOUTH AT BEDTIME 90 tablet 1   Current Facility-Administered Medications  Medication Dose Route Frequency Provider Last Rate Last Dose  . 0.9 %  sodium chloride infusion  500 mL Intravenous Continuous Nandigam, Venia Minks, MD        Allergies  Allergen Reactions  . Penicillins Anaphylaxis    Other reaction(s): Hypotension  . Codeine     Upset stomach   . Shellfish-Derived Products Nausea And Vomiting  Review of Systems negative except from HPI and PMH  Physical Exam BP 110/70   Pulse 82   Ht 5\' 7"  (1.702 m)   Wt 250 lb 9.6 oz (113.7 kg)   SpO2 94%   BMI 39.25 kg/m  Well developed and nourished in no acute distress HENT normal Neck supple with JVP-flat Clear Regular rate and rhythm, no murmurs or gallops Abd-soft with active BS No Clubbing cyanosis edema Skin-warm and dry A & Oriented  Grossly normal sensory and motor function   ECG demonstrates  AV pacing   Assessment and  Plan  Nonischemic cardiomyopathy with interval normalization    6949-lead replaced with a 8250  Systolic heart failure chronic class 2  High Risk Medication Surveillance  Implantable defibrillator-CRT-Medtronic The patient's device was interrogated.  The information was reviewed. No changes were made in the programming.       Euvolemic continue current meds  No medication changes

## 2018-09-24 NOTE — Patient Instructions (Signed)
Medication Instructions:  Your physician recommends that you continue on your current medications as directed. Please refer to the Current Medication list given to you today.  Labwork: You will have labs drawn today: CBC and BMP   Testing/Procedures: None ordered.  Follow-Up: Your physician recommends that you schedule a follow-up appointment in:   One Year with Dr Caryl Comes  Remote monitoring is used to monitor your ICD from home. This monitoring reduces the number of office visits required to check your device to one time per year. It allows [place] to keep an eye on the functioning of your device to ensure it is working properly. You are scheduled for a device check from home on 10/19/2018. You may send your transmission at any time that day. If you have a wireless device, the transmission will be sent automatically. After your physician reviews your transmission, you will receive a postcard with your next transmission date.    Any Other Special Instructions Will Be Listed Below (If Applicable).     If you need a refill on your cardiac medications before your next appointment, please call your pharmacy.

## 2018-09-25 LAB — BASIC METABOLIC PANEL
BUN/Creatinine Ratio: 13 (ref 10–24)
BUN: 11 mg/dL (ref 8–27)
CALCIUM: 10 mg/dL (ref 8.6–10.2)
CHLORIDE: 99 mmol/L (ref 96–106)
CO2: 22 mmol/L (ref 20–29)
Creatinine, Ser: 0.82 mg/dL (ref 0.76–1.27)
GFR calc Af Amer: 110 mL/min/{1.73_m2} (ref >59)
GFR calc non Af Amer: 95 mL/min/{1.73_m2} (ref >59)
GLUCOSE: 107 mg/dL — AB (ref 65–99)
POTASSIUM: 4.5 mmol/L (ref 3.5–5.2)
Sodium: 139 mmol/L (ref 134–144)

## 2018-09-25 LAB — CBC
HEMOGLOBIN: 14.2 g/dL (ref 13.0–17.7)
Hematocrit: 41.3 % (ref 37.5–51.0)
MCH: 28.1 pg (ref 26.6–33.0)
MCHC: 34.4 g/dL (ref 31.5–35.7)
MCV: 82 fL (ref 79–97)
Platelets: 231 10*3/uL (ref 150–450)
RBC: 5.06 x10E6/uL (ref 4.14–5.80)
RDW: 14.4 % (ref 11.6–15.4)
WBC: 11.2 10*3/uL — AB (ref 3.4–10.8)

## 2018-10-19 LAB — CUP PACEART REMOTE DEVICE CHECK
Battery Remaining Longevity: 58 mo
Brady Statistic AP VP Percent: 32.7 %
Brady Statistic AP VS Percent: 0.02 %
Brady Statistic AS VP Percent: 67.25 %
Brady Statistic RA Percent Paced: 32.72 %
Brady Statistic RV Percent Paced: 99.93 %
HighPow Impedance: 52 Ohm
HighPow Impedance: 79 Ohm
Implantable Lead Implant Date: 20050120
Implantable Lead Implant Date: 20050120
Implantable Lead Location: 753858
Implantable Lead Location: 753859
Implantable Lead Location: 753860
Implantable Lead Model: 5076
Lead Channel Impedance Value: 304 Ohm
Lead Channel Impedance Value: 399 Ohm
Lead Channel Impedance Value: 532 Ohm
Lead Channel Impedance Value: 608 Ohm
Lead Channel Impedance Value: 646 Ohm
Lead Channel Pacing Threshold Amplitude: 0.625 V
Lead Channel Pacing Threshold Amplitude: 1.125 V
Lead Channel Pacing Threshold Pulse Width: 0.4 ms
Lead Channel Sensing Intrinsic Amplitude: 3.125 mV
Lead Channel Sensing Intrinsic Amplitude: 3.125 mV
Lead Channel Setting Pacing Amplitude: 2.25 V
Lead Channel Setting Pacing Pulse Width: 0.4 ms
Lead Channel Setting Pacing Pulse Width: 0.4 ms
MDC IDC LEAD IMPLANT DT: 20100826
MDC IDC MSMT BATTERY VOLTAGE: 2.97 V
MDC IDC MSMT LEADCHNL LV IMPEDANCE VALUE: 760 Ohm
MDC IDC MSMT LEADCHNL LV PACING THRESHOLD PULSEWIDTH: 0.4 ms
MDC IDC MSMT LEADCHNL RV PACING THRESHOLD AMPLITUDE: 0.5 V
MDC IDC MSMT LEADCHNL RV PACING THRESHOLD PULSEWIDTH: 0.4 ms
MDC IDC MSMT LEADCHNL RV SENSING INTR AMPL: 12.625 mV
MDC IDC MSMT LEADCHNL RV SENSING INTR AMPL: 12.625 mV
MDC IDC PG IMPLANT DT: 20170106
MDC IDC SESS DTM: 20200211072604
MDC IDC SET LEADCHNL RA PACING AMPLITUDE: 1.5 V
MDC IDC SET LEADCHNL RV PACING AMPLITUDE: 2 V
MDC IDC SET LEADCHNL RV SENSING SENSITIVITY: 0.45 mV
MDC IDC STAT BRADY AS VS PERCENT: 0.04 %

## 2018-10-22 ENCOUNTER — Other Ambulatory Visit: Payer: Self-pay | Admitting: Cardiology

## 2018-11-22 ENCOUNTER — Other Ambulatory Visit: Payer: Self-pay

## 2018-11-22 ENCOUNTER — Ambulatory Visit (INDEPENDENT_AMBULATORY_CARE_PROVIDER_SITE_OTHER): Payer: Managed Care, Other (non HMO)

## 2018-11-22 DIAGNOSIS — I5022 Chronic systolic (congestive) heart failure: Secondary | ICD-10-CM

## 2018-11-22 DIAGNOSIS — I428 Other cardiomyopathies: Secondary | ICD-10-CM | POA: Diagnosis not present

## 2018-11-22 DIAGNOSIS — Z95810 Presence of automatic (implantable) cardiac defibrillator: Secondary | ICD-10-CM

## 2018-11-23 LAB — CUP PACEART REMOTE DEVICE CHECK
Battery Remaining Longevity: 56 mo
Brady Statistic AP VP Percent: 35.18 %
Brady Statistic AP VS Percent: 0.02 %
Brady Statistic AS VS Percent: 0.03 %
Brady Statistic RA Percent Paced: 35.19 %
Date Time Interrogation Session: 20200316062703
HighPow Impedance: 54 Ohm
HighPow Impedance: 79 Ohm
Implantable Lead Implant Date: 20050120
Implantable Lead Implant Date: 20050120
Implantable Lead Implant Date: 20100826
Implantable Lead Location: 753859
Implantable Lead Location: 753860
Implantable Lead Model: 5076
Implantable Lead Model: 5076
Implantable Pulse Generator Implant Date: 20170106
Lead Channel Impedance Value: 285 Ohm
Lead Channel Impedance Value: 361 Ohm
Lead Channel Impedance Value: 608 Ohm
Lead Channel Impedance Value: 665 Ohm
Lead Channel Pacing Threshold Amplitude: 0.5 V
Lead Channel Pacing Threshold Pulse Width: 0.4 ms
Lead Channel Sensing Intrinsic Amplitude: 12.25 mV
Lead Channel Sensing Intrinsic Amplitude: 3.75 mV
Lead Channel Sensing Intrinsic Amplitude: 3.75 mV
Lead Channel Setting Pacing Amplitude: 2.5 V
Lead Channel Setting Pacing Pulse Width: 0.4 ms
MDC IDC LEAD LOCATION: 753858
MDC IDC MSMT BATTERY VOLTAGE: 2.9 V
MDC IDC MSMT LEADCHNL LV IMPEDANCE VALUE: 779 Ohm
MDC IDC MSMT LEADCHNL LV PACING THRESHOLD AMPLITUDE: 1.375 V
MDC IDC MSMT LEADCHNL LV PACING THRESHOLD PULSEWIDTH: 0.4 ms
MDC IDC MSMT LEADCHNL RA PACING THRESHOLD AMPLITUDE: 0.625 V
MDC IDC MSMT LEADCHNL RV IMPEDANCE VALUE: 532 Ohm
MDC IDC MSMT LEADCHNL RV PACING THRESHOLD PULSEWIDTH: 0.4 ms
MDC IDC MSMT LEADCHNL RV SENSING INTR AMPL: 12.25 mV
MDC IDC SET LEADCHNL LV PACING PULSEWIDTH: 0.4 ms
MDC IDC SET LEADCHNL RA PACING AMPLITUDE: 1.5 V
MDC IDC SET LEADCHNL RV PACING AMPLITUDE: 2 V
MDC IDC SET LEADCHNL RV SENSING SENSITIVITY: 0.45 mV
MDC IDC STAT BRADY AS VP PERCENT: 64.77 %
MDC IDC STAT BRADY RV PERCENT PACED: 99.92 %

## 2018-11-30 ENCOUNTER — Encounter: Payer: Self-pay | Admitting: Cardiology

## 2018-11-30 ENCOUNTER — Ambulatory Visit: Payer: Managed Care, Other (non HMO) | Admitting: Cardiology

## 2018-11-30 NOTE — Progress Notes (Signed)
Remote ICD transmission.   

## 2018-12-12 ENCOUNTER — Other Ambulatory Visit: Payer: Self-pay | Admitting: Cardiovascular Disease

## 2018-12-13 NOTE — Telephone Encounter (Signed)
Refilled spironolactone

## 2019-02-10 ENCOUNTER — Telehealth: Payer: Self-pay | Admitting: Cardiology

## 2019-02-10 NOTE — Progress Notes (Signed)
Virtual Visit via Video Note   This visit type was conducted due to national recommendations for restrictions regarding the COVID-19 Pandemic (e.g. social distancing) in an effort to limit this patient's exposure and mitigate transmission in our community.  Due to his co-morbid illnesses, this patient is at least at moderate risk for complications without adequate follow up.  This format is felt to be most appropriate for this patient at this time.  All issues noted in this document were discussed and addressed.  A limited physical exam was performed with this format.  Please refer to the patient's chart for his consent to telehealth for [facility].   Date:  02/14/2019   ID:  Jonathon Avila, DOB 1956/06/29, MRN [id number]  Patient Location: Home Provider Location: Office  PCP:  Chesley Noon, MD  Cardiologist:  Huckleberry Martinson Martinique MD Electrophysiologist:  None   Evaluation Performed:  Follow-Up Visit  Chief Complaint:  Follow up CHF  History of Present Illness:    Jonathon Avila is a 63 y.o. male with a history of nonischemic cardiomyopathy with systolic congestive heart failure. He is status post biventricular ICD. This did result in significant improvement in LV function. Echo was last check in March 2018 and was unchanged from 2014 with EF 45-50%. He underwent a generator change out of his ICD on 0/9/98 without complications. Last ICD check in 11/22/18 was satisfactory with normal Optivol and 99% BiV pacing. .  On follow up today he is doing well. He did join a gym in January but hasn't been able to go with the pandemic. he is staying at home and social distancing. He is doing some walking. Did initially gain weight but this has come down with better attention to diet.  Denies any increase edema.   He denies dyspnea or chest pain. No palpitations or dizziness.   The patient does not have symptoms concerning for COVID-19 infection (fever, chills, cough, or new shortness of  breath).    Past Medical History:  Diagnosis Date  . [phone] lead    6949 rate sense portion was replaced with a Medtronic 5076-lead 2010   . Allergy   . Chronic systolic CHF (congestive heart failure) ([place])   . Hyperlipidemia   . 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 LBBB (left bundle branch block)   . LV dysfunction   . Mitral insufficiency   . Nonischemic dilated cardiomyopathy ([place])   . Pulmonary hypertension ([place])    Past Surgical History:  Procedure Laterality Date  . CARDIAC CATHETERIZATION  10/25/2001   EF 65%  . CHOLECYSTECTOMY    . EP IMPLANTABLE DEVICE N/A 09/14/2015   Procedure:  ICD Generator Changeout;  Surgeon: Deboraha Sprang, MD;  Location: [place] [facility];  Service: Cardiovascular;  Laterality: N/A;  . ICD     BIVENTRICULAR  . US ECHOCARDIOGRAPHY  02/19/2009   EF 45%  . wisdom teeth extraxtion       Current Meds  Medication Sig  . aspirin EC 81 MG tablet Take 1 tablet (81 mg total) by mouth daily.  . carvedilol (COREG) 25 MG tablet TAKE 1 TABLET BY MOUTH TWICE DAILY WITH  A  MEAL  . cholecalciferol (VITAMIN D) 1000 UNITS tablet Take 2,000 Units by mouth 2 (two) times daily.   [name] docusate sodium (COLACE) 100 MG capsule Take 1-2 capsules by mouth 2 (two) times daily as needed for constipation.  . fluticasone (FLONASE) 50 MCG/ACT nasal  spray Place 2 sprays into the nose daily.  . furosemide (LASIX) 40 MG tablet TAKE ONE TABLET BY MOUTH AS NEEDED  . lisinopril (PRINIVIL,ZESTRIL) 5 MG tablet Take 5 mg by mouth daily.  [name] loratadine (CLARITIN) 10 MG tablet Take 1 tablet by mouth daily.  . montelukast (SINGULAIR) 10 MG tablet Take 10 mg by mouth at bedtime.  . simvastatin (ZOCOR) 20 MG tablet Take 1 tablet by mouth daily.  [name] spironolactone (ALDACTONE) 25 MG tablet TAKE 1 TABLET BY MOUTH AT BEDTIME   Current Facility-Administered Medications for the 02/14/19 encounter (Telemedicine) with Martinique, Jahmal Dunavant M, MD  Medication   . 0.9 %  sodium chloride infusion     Allergies:   Penicillins; Codeine; and Shellfish-derived products   Social History   Tobacco Use  . Smoking status: Never Smoker  . Smokeless tobacco: Never Used  Substance Use Topics  . Alcohol use: No  . Drug use: No     Family Hx: The patient's family history includes COPD in his mother; Colon cancer in his father; Prostate cancer in his maternal grandmother. There is no history of Esophageal cancer, Pancreatic cancer, Rectal cancer, or Stomach cancer.  ROS:   Please see the history of present illness.    All other systems reviewed and are negative.   Prior CV studies:   The following studies were reviewed today:  Echo 11/14/16: Study Conclusions  - Left ventricle: The cavity size was normal. Wall thickness was increased in a pattern of mild LVH. Systolic function was mildly reduced. The estimated ejection fraction was in the range of 45% to 50%. Incoordinate septal motion. Doppler parameters are consistent with abnormal left ventricular relaxation (grade 1 diastolic dysfunction). The E/e&' ratio is between 8-15, suggesting indeterminate LV filling pressure. - Left atrium: The atrium was normal in size. - Right ventricle: The cavity size was normal. Wall thickness was normal. Pacer wire or catheter noted in right ventricle. Systolic function was normal. - Right atrium: The atrium was mildly dilated. Pacer wire or catheter noted in right atrium. - Atrial septum: No defect or patent foramen ovale was identified. - Tricuspid valve: There was mild regurgitation. - Pulmonary arteries: PA peak pressure: 27 mm Hg (S). - Inferior vena cava: The vessel was normal in size. The respirophasic diameter changes were in the normal range (>= 50%), consistent with normal central venous pressure.  Impressions:  - Compared to a previous study in 2014, there has been no change.   Labs/Other Tests and Data Reviewed:     EKG:  No ECG reviewed.  Recent Labs: 09/24/2018: BUN 11; Creatinine, Ser 0.82; Hemoglobin 14.2; Platelets 231; Potassium 4.5; Sodium 139   Dated 07/21/18: normal CMET and CBC. Cholesterol 109, triglycerides 86, HDL 34, LDL 58.   Recent Lipid Panel Lab Results  Component Value Date/Time   CHOL  10/21/2009 02:20 AM    112        ATP III CLASSIFICATION:  <200     mg/dL   Desirable  200-239  mg/dL   Borderline High  >=240    mg/dL   High          TRIG 129 10/21/2009 02:20 AM   HDL 34 (L) 10/21/2009 02:20 AM   CHOLHDL 3.3 10/21/2009 02:20 AM   LDLCALC  10/21/2009 02:20 AM    52        Total Cholesterol/HDL:CHD Risk Coronary Heart Disease Risk Table  Men   Women  1/2 Average Risk   3.4   3.3  Average Risk       5.0   4.4  2 X Average Risk   9.6   7.1  3 X Average Risk  23.4   11.0        Use the calculated Patient Ratio above and the CHD Risk Table to determine the patient's CHD Risk.        ATP III CLASSIFICATION (LDL):  <100     mg/dL   Optimal  100-129  mg/dL   Near or Above                    Optimal  130-159  mg/dL   Borderline  160-189  mg/dL   High  >190     mg/dL   Very High    Wt Readings from Last 3 Encounters:  02/14/19 246 lb 3.2 oz (111.7 kg)  09/24/18 250 lb 9.6 oz (113.7 kg)  06/03/18 244 lb 3.2 oz (110.8 kg)     Objective:    Vital Signs:  BP 127/72   Pulse 69   Temp (!) 97.3 F (36.3 C)   Ht 5\' 7"  (1.702 m)   Wt 246 lb 3.2 oz (111.7 kg)   BMI 38.56 kg/m    VITAL SIGNS:  reviewed  General no distress HEENT. Wears glasses. Sclera are clear Respirations: unlabored. Skin clear Neuro alert and oriented x 3. Nonfocal. Mood normal  ASSESSMENT & PLAN:    1. Chronic systolic congestive heart failure. Last Ejection fraction of 45-50% in March 2018- unchanged from 2014. He has class 1-2 symptoms. He is on optimal medical therapy with an ACE inhibitor, carvedilol, and Aldactone.  He has a biventricular pacemaker in place.  Continue current therapy. Encourage continued aerobic activity.  2. Status post ICD/biventricular pacemaker. Continue followup in EP clinic. S/p generator change out Jan 2017. ICD check in March was good.    3. Obesity.he has lost weight. Encourage dietary modification.  4. Left bundle branch block.  COVID-19 Education: The signs and symptoms of COVID-19 were discussed with the patient and how to seek care for testing (follow up with PCP or arrange E-visit).  The importance of social distancing was discussed today.  Time:   Today, I have spent 15 minutes with the patient with telehealth technology discussing the above problems.     Medication Adjustments/Labs and Tests Ordered: Current medicines are reviewed at length with the patient today.  Concerns regarding medicines are outlined above.   Tests Ordered: No orders of the defined types were placed in this encounter.   Medication Changes: No orders of the defined types were placed in this encounter.   Disposition:  Follow up in 6 month(s)  Signed, Cleotha Tsang Martinique, MD  02/14/2019 8:49 AM    [facility]

## 2019-02-10 NOTE — Telephone Encounter (Signed)
smartphone/ my chart via emailed/ consent/ pre reg completed

## 2019-02-14 ENCOUNTER — Ambulatory Visit: Payer: Managed Care, Other (non HMO) | Admitting: Cardiology

## 2019-02-14 ENCOUNTER — Encounter: Payer: Self-pay | Admitting: Cardiology

## 2019-02-14 ENCOUNTER — Telehealth (INDEPENDENT_AMBULATORY_CARE_PROVIDER_SITE_OTHER): Payer: Managed Care, Other (non HMO) | Admitting: Cardiology

## 2019-02-14 VITALS — BP 127/72 | HR 69 | Temp 97.3°F | Ht 67.0 in | Wt 246.2 lb

## 2019-02-14 DIAGNOSIS — I5022 Chronic systolic (congestive) heart failure: Secondary | ICD-10-CM

## 2019-02-14 DIAGNOSIS — I428 Other cardiomyopathies: Secondary | ICD-10-CM

## 2019-02-14 DIAGNOSIS — I447 Left bundle-branch block, unspecified: Secondary | ICD-10-CM | POA: Diagnosis not present

## 2019-02-14 DIAGNOSIS — Z95810 Presence of automatic (implantable) cardiac defibrillator: Secondary | ICD-10-CM

## 2019-02-14 DIAGNOSIS — E7800 Pure hypercholesterolemia, unspecified: Secondary | ICD-10-CM

## 2019-02-14 NOTE — Patient Instructions (Signed)

## 2019-02-21 ENCOUNTER — Ambulatory Visit (INDEPENDENT_AMBULATORY_CARE_PROVIDER_SITE_OTHER): Payer: Managed Care, Other (non HMO) | Admitting: *Deleted

## 2019-02-21 DIAGNOSIS — I5022 Chronic systolic (congestive) heart failure: Secondary | ICD-10-CM | POA: Diagnosis not present

## 2019-02-21 DIAGNOSIS — I428 Other cardiomyopathies: Secondary | ICD-10-CM

## 2019-02-21 LAB — CUP PACEART REMOTE DEVICE CHECK
Battery Remaining Longevity: 53 mo
Battery Voltage: 2.97 V
Brady Statistic AP VP Percent: 33.43 %
Brady Statistic AP VS Percent: 0.02 %
Brady Statistic AS VP Percent: 66.53 %
Brady Statistic AS VS Percent: 0.02 %
Brady Statistic RA Percent Paced: 33.45 %
Brady Statistic RV Percent Paced: 99.93 %
Date Time Interrogation Session: 20200615052205
HighPow Impedance: 54 Ohm
HighPow Impedance: 84 Ohm
Implantable Lead Implant Date: 20050120
Implantable Lead Implant Date: 20050120
Implantable Lead Implant Date: 20100826
Implantable Lead Location: 753858
Implantable Lead Location: 753859
Implantable Lead Location: 753860
Implantable Lead Model: 4194
Implantable Lead Model: 5076
Implantable Lead Model: 5076
Implantable Pulse Generator Implant Date: 20170106
Lead Channel Impedance Value: 304 Ohm
Lead Channel Impedance Value: 399 Ohm
Lead Channel Impedance Value: 551 Ohm
Lead Channel Impedance Value: 608 Ohm
Lead Channel Impedance Value: 665 Ohm
Lead Channel Impedance Value: 817 Ohm
Lead Channel Pacing Threshold Amplitude: 0.5 V
Lead Channel Pacing Threshold Amplitude: 0.625 V
Lead Channel Pacing Threshold Amplitude: 1.375 V
Lead Channel Pacing Threshold Pulse Width: 0.4 ms
Lead Channel Pacing Threshold Pulse Width: 0.4 ms
Lead Channel Pacing Threshold Pulse Width: 0.4 ms
Lead Channel Sensing Intrinsic Amplitude: 13 mV
Lead Channel Sensing Intrinsic Amplitude: 13 mV
Lead Channel Sensing Intrinsic Amplitude: 3.75 mV
Lead Channel Sensing Intrinsic Amplitude: 3.75 mV
Lead Channel Setting Pacing Amplitude: 1.5 V
Lead Channel Setting Pacing Amplitude: 2 V
Lead Channel Setting Pacing Amplitude: 2.5 V
Lead Channel Setting Pacing Pulse Width: 0.4 ms
Lead Channel Setting Pacing Pulse Width: 0.4 ms
Lead Channel Setting Sensing Sensitivity: 0.45 mV

## 2019-03-03 ENCOUNTER — Encounter: Payer: Self-pay | Admitting: Cardiology

## 2019-03-03 NOTE — Progress Notes (Signed)
Remote ICD transmission.   

## 2019-05-23 ENCOUNTER — Ambulatory Visit (INDEPENDENT_AMBULATORY_CARE_PROVIDER_SITE_OTHER): Payer: Managed Care, Other (non HMO) | Admitting: *Deleted

## 2019-05-23 DIAGNOSIS — I428 Other cardiomyopathies: Secondary | ICD-10-CM

## 2019-05-23 DIAGNOSIS — I5022 Chronic systolic (congestive) heart failure: Secondary | ICD-10-CM

## 2019-05-24 LAB — CUP PACEART REMOTE DEVICE CHECK
Battery Remaining Longevity: 46 mo
Battery Voltage: 2.96 V
Brady Statistic AP VP Percent: 34.98 %
Brady Statistic AP VS Percent: 0.02 %
Brady Statistic AS VP Percent: 64.98 %
Brady Statistic AS VS Percent: 0.02 %
Brady Statistic RA Percent Paced: 34.99 %
Brady Statistic RV Percent Paced: 99.85 %
Date Time Interrogation Session: 20200914103425
HighPow Impedance: 57 Ohm
HighPow Impedance: 86 Ohm
Implantable Lead Implant Date: 20050120
Implantable Lead Implant Date: 20050120
Implantable Lead Implant Date: 20100826
Implantable Lead Location: 753858
Implantable Lead Location: 753859
Implantable Lead Location: 753860
Implantable Lead Model: 4194
Implantable Lead Model: 5076
Implantable Lead Model: 5076
Implantable Pulse Generator Implant Date: 20170106
Lead Channel Impedance Value: 304 Ohm
Lead Channel Impedance Value: 418 Ohm
Lead Channel Impedance Value: 551 Ohm
Lead Channel Impedance Value: 608 Ohm
Lead Channel Impedance Value: 703 Ohm
Lead Channel Impedance Value: 817 Ohm
Lead Channel Pacing Threshold Amplitude: 0.5 V
Lead Channel Pacing Threshold Amplitude: 0.625 V
Lead Channel Pacing Threshold Amplitude: 1.25 V
Lead Channel Pacing Threshold Pulse Width: 0.4 ms
Lead Channel Pacing Threshold Pulse Width: 0.4 ms
Lead Channel Pacing Threshold Pulse Width: 0.4 ms
Lead Channel Sensing Intrinsic Amplitude: 11.125 mV
Lead Channel Sensing Intrinsic Amplitude: 11.125 mV
Lead Channel Sensing Intrinsic Amplitude: 3.125 mV
Lead Channel Sensing Intrinsic Amplitude: 3.125 mV
Lead Channel Setting Pacing Amplitude: 1.5 V
Lead Channel Setting Pacing Amplitude: 2 V
Lead Channel Setting Pacing Amplitude: 2.75 V
Lead Channel Setting Pacing Pulse Width: 0.4 ms
Lead Channel Setting Pacing Pulse Width: 0.4 ms
Lead Channel Setting Sensing Sensitivity: 0.45 mV

## 2019-05-30 ENCOUNTER — Telehealth: Payer: Self-pay

## 2019-05-30 NOTE — Telephone Encounter (Signed)
Patient is calling to see if someone can look in his old chart and see if he was diagnosed with a milk or lactose allergy. I explained to the patient that his chart is currently in our [facility] office and someone would have to go looking for it. He thinks it was about 8 or 9 years ago when he came. I am unable to access med man.  Can someone help with this?

## 2019-05-30 NOTE — Telephone Encounter (Signed)
Pt was seen in 2014.  I will look for chart tomorrow.

## 2019-06-03 ENCOUNTER — Encounter: Payer: Self-pay | Admitting: Cardiology

## 2019-06-03 NOTE — Progress Notes (Signed)
Remote ICD transmission.   

## 2019-06-06 NOTE — Telephone Encounter (Addendum)
Reviewed paper chart and there is no documentation of a milk/lactose intolerance. There was however a note of possible shellfish allergy but skin test neg and doctor recommended food challenge. Pt is experiencing constipation with dairy. I offered to make apt but pt denied and will call back if he decides to schedule.

## 2019-06-09 ENCOUNTER — Telehealth: Payer: Self-pay | Admitting: Gastroenterology

## 2019-06-09 NOTE — Telephone Encounter (Signed)
Pt last OV 07/29/16.  He requested to speak to a nurse about constipation.

## 2019-06-09 NOTE — Telephone Encounter (Signed)
Patient last seen in 2018 for a colonoscopy. Has a 3 year recall making him due for a colonoscopy January of 2021. He calls [place] today with complaints of a new onset of constipation that he has been dealing with for 2 months. He feels it was triggered by diet changes. Since August he has been attempting to eat healthier, increasing fiber and drinking more water. When the constipation began, PCP advised he use Miralax, which he took BID. He used Miralax to the point of diarrhea. Stopped drinking Miralax. Yesterday he had "a bad day going (BM) about 8 times." He feels like there is "something at my butt hole." Stools are not presently hard, but he "feels constipated." Denies abdominal pain, nausea, fever or bloody bowel movements. Tomorrow he will see his PCP for this issue. He agrees to an appointment here with Dr Silverio Decamp 06/16/19 which he can cancel if the PCP advises him to do so. Patient agrees to this plan.

## 2019-06-09 NOTE — Telephone Encounter (Signed)
Pt had procedure with Dr Silverio Decamp 10/02/16

## 2019-06-09 NOTE — Telephone Encounter (Signed)
Okay thank you

## 2019-06-16 ENCOUNTER — Ambulatory Visit (INDEPENDENT_AMBULATORY_CARE_PROVIDER_SITE_OTHER): Payer: Managed Care, Other (non HMO) | Admitting: Gastroenterology

## 2019-06-16 ENCOUNTER — Other Ambulatory Visit (INDEPENDENT_AMBULATORY_CARE_PROVIDER_SITE_OTHER): Payer: Managed Care, Other (non HMO)

## 2019-06-16 VITALS — Temp 97.6°F | Ht 67.0 in | Wt 228.0 lb

## 2019-06-16 DIAGNOSIS — K588 Other irritable bowel syndrome: Secondary | ICD-10-CM

## 2019-06-16 DIAGNOSIS — Z86010 Personal history of colonic polyps: Secondary | ICD-10-CM | POA: Diagnosis not present

## 2019-06-16 DIAGNOSIS — K5909 Other constipation: Secondary | ICD-10-CM | POA: Diagnosis not present

## 2019-06-16 DIAGNOSIS — R140 Abdominal distension (gaseous): Secondary | ICD-10-CM

## 2019-06-16 LAB — TSH: TSH: 0.63 u[IU]/mL (ref 0.35–4.50)

## 2019-06-16 MED ORDER — LINACLOTIDE 72 MCG PO CAPS: 72.0000 ug | ORAL_CAPSULE | Freq: Every day | ORAL | 3 refills | Status: DC

## 2019-06-16 NOTE — Progress Notes (Signed)
Jonathon Avila    [id number]    1955/10/17  Primary Care Physician:Badger, Rebeca Alert, MD  Referring Physician: Chesley Noon, MD [place],  [place] [address]   Chief complaint:  Constipation  HPI: 63 yr M here for follow up visit with complaints of constipation.  He noticed change in bowel habits with constipation since Labor day, he has changed his diet significantly, is trying to eat high fiber diet with more vegetables and also has stopped drinking soda. Previously he was drinking 4-5 diet soda a day.  He felt generalized abdominal discomfort and bloating with decreased appetite, some what improved with Miralax as needed and also used rectal suppository.  He feels his bowel habits are not back to baseline, doesn't have a BM unless he takes Miralax. Appetite is slowly back, feels he is eating better in past 2 weeks.  Abd X-ray with no evidence of bowel obstructions, labs unremarkable.   Denies any nausea, vomiting, abdominal pain, melena or bright red blood per rectum  Colonoscopy 10/02/2016: 11 mm polyp removed from cecum , 3 sub cm polyps from ascening and transverse colon (4 tubular adenoma). 3 Hyperplastic polyps removed from rectum and sigmoid colon.  Outpatient Encounter Medications as of 06/16/2019  Medication Sig  . aspirin EC 81 MG tablet Take 1 tablet (81 mg total) by mouth daily.  . carvedilol (COREG) 25 MG tablet TAKE 1 TABLET BY MOUTH TWICE DAILY WITH  A  MEAL  . cholecalciferol (VITAMIN D) 1000 UNITS tablet Take 2,000 Units by mouth 2 (two) times daily.   [name] docusate sodium (COLACE) 100 MG capsule Take 1-2 capsules by mouth 2 (two) times daily as needed for constipation.  . fluticasone (FLONASE) 50 MCG/ACT nasal spray Place 2 sprays into the nose daily.  . furosemide (LASIX) 40 MG tablet TAKE ONE TABLET BY MOUTH AS NEEDED  . lisinopril (PRINIVIL,ZESTRIL) 5 MG tablet Take 5 mg by mouth daily.  [name] loratadine (CLARITIN) 10 MG tablet  Take 1 tablet by mouth daily.  . montelukast (SINGULAIR) 10 MG tablet Take 10 mg by mouth at bedtime.  . simvastatin (ZOCOR) 20 MG tablet Take 1 tablet by mouth daily.  [name] spironolactone (ALDACTONE) 25 MG tablet TAKE 1 TABLET BY MOUTH AT BEDTIME   Facility-Administered Encounter Medications as of 06/16/2019  Medication  . 0.9 %  sodium chloride infusion    Allergies as of 06/16/2019 - Review Complete 06/16/2019  Allergen Reaction Noted  . Penicillins Anaphylaxis 01/11/2018  . Codeine  04/24/2011  . Shellfish-derived products Nausea And Vomiting 06/30/2014    Past Medical History:  Diagnosis Date  . [phone] lead    6949 rate sense portion was replaced with a Medtronic 5076-lead 2010   . Allergy   . Chronic systolic CHF (congestive heart failure) ([place])   . Hyperlipidemia   . 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 LBBB (left bundle branch block)   . LV dysfunction   . Mitral insufficiency   . Nonischemic dilated cardiomyopathy ([place])   . Pulmonary hypertension ([place])     Past Surgical History:  Procedure Laterality Date  . CARDIAC CATHETERIZATION  10/25/2001   EF 65%  . CHOLECYSTECTOMY    . EP IMPLANTABLE DEVICE N/A 09/14/2015   Procedure:  ICD Generator Changeout;  Surgeon: Deboraha Sprang, MD;  Location: [facility];  Service: Cardiovascular;  Laterality: N/A;  . ICD  BIVENTRICULAR  . US ECHOCARDIOGRAPHY  02/19/2009   EF 45%  . wisdom teeth extraxtion      Family History  Problem Relation Age of Onset  . COPD Mother        deceased  . Colon cancer Father        deceased age 1  . Prostate cancer Maternal Grandmother   . Esophageal cancer Neg Hx   . Pancreatic cancer Neg Hx   . Rectal cancer Neg Hx   . Stomach cancer Neg Hx     Social History   Socioeconomic History  . Marital status: Married    Spouse name: Not on file  . Number of children: 2  . Years of education: Not on file  . Highest education  level: Not on file  Occupational History  . 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 Financial resource strain: Not on file  . Food insecurity    Worry: Not on file    Inability: Not on file  . Transportation needs    Medical: Not on file    Non-medical: Not on file  Tobacco Use  . Smoking status: Never Smoker  . Smokeless tobacco: Never Used  Substance and Sexual Activity  . Alcohol use: No  . Drug use: No  . Sexual activity: Not on file  Lifestyle  . Physical activity    Days per week: Not on file    Minutes per session: Not on file  . Stress: Not on file  Relationships  . Social Herbalist on phone: Not on file    Gets together: Not on file    Attends religious service: Not on file    Active member of club or organization: Not on file    Attends meetings of clubs or organizations: Not on file    Relationship status: Not on file  . Intimate partner violence    Fear of current or ex partner: Not on file    Emotionally abused: Not on file    Physically abused: Not on file    Forced sexual activity: Not on file  Other Topics Concern  . Not on file  Social History Narrative  . Not on file      Review of systems: Review of Systems  Constitutional: Negative for fever and chills.  HENT: Negative.   Eyes: Negative for blurred vision.  Respiratory: Negative for cough, shortness of breath and wheezing.   Cardiovascular: Negative for chest pain and palpitations.  Gastrointestinal: as per HPI Genitourinary: Negative for dysuria, urgency, frequency and hematuria.  Musculoskeletal: Negative for myalgias, back pain and joint pain.  Skin: Negative for itching and rash.  Neurological: Negative for dizziness, tremors, focal weakness, seizures and loss of consciousness.  Endo/Heme/Allergies: Positive for seasonal allergies.  Psychiatric/Behavioral: Negative for depression, suicidal ideas and hallucinations.  All other  systems reviewed and are negative.   Physical Exam: Vitals:   06/16/19 0959  Temp: 97.6 F (36.4 C)   Body mass index is 35.71 kg/m. Gen:      No acute distress HEENT:  EOMI, sclera anicteric Neck:     No masses; no thyromegaly Lungs:    Clear to auscultation bilaterally; normal respiratory effort CV:         Regular rate and rhythm; no murmurs Abd:      + bowel sounds; soft, non-tender; no palpable masses, no distension Ext:    No edema;  adequate peripheral perfusion Skin:      Warm and dry; no rash Neuro: alert and oriented x 3 Psych: normal mood and affect  Data Reviewed:  Reviewed labs, radiology imaging, old records and pertinent past GI work up   Assessment and Plan/Recommendations:  32 yr M with obesity recent change in bowel habits and constipation likely secondary to dietary and lifestyle changes  Encouraged patient to continue with high fiber diet, fruits, vegetables and whole grains. Continue to avoid soda Refer to Nutrition for counseling, as he is unclear regarding food choices and needs additional guidance  Fiber choice 1 tablet 2-3 times daily with meals Start Linzess 72 mcg daily, titrate dose up based on response to have 1-2 soft bowel movement daily Increase daily water intake 8-10 cups  Will check TSH to exclude hypothyroidism, no recent level  Trial of IB gard 1 capsule TID prn for IBS symptoms, abdominal bloating and discomfort  Due for surveillance colonoscopy Jan 2021, h/o advanced adenomatous colon polyps  25 minutes was spent face-to-face with the patient. Greater than 50% of the time used for counseling as well as treatment plan and follow-up. She had multiple questions which were answered to her satisfaction  K. Denzil Magnuson , MD    CC: Chesley Noon, MD

## 2019-06-16 NOTE — Patient Instructions (Addendum)
Go to the basement for labs today  Take IBGard three times a day as neede  Take Fiberchoice 1 tablet 2-3 times daily  We will refer you to nutrient and they will contact you with that appointment  Follow up in 3 months    If you are age 63 or older, your body mass index should be between 23-30. Your Body mass index is 35.71 kg/m. If this is out of the aforementioned range listed, please consider follow up with your Primary Care Provider.  If you are age 53 or younger, your body mass index should be between 19-25. Your Body mass index is 35.71 kg/m. If this is out of the aformentioned range listed, please consider follow up with your Primary Care Provider.    I appreciate the  opportunity to care for you  Thank You   Harl Bowie , MD

## 2019-06-17 ENCOUNTER — Encounter: Payer: Self-pay | Admitting: Gastroenterology

## 2019-06-20 ENCOUNTER — Other Ambulatory Visit: Payer: Self-pay | Admitting: Cardiovascular Disease

## 2019-06-21 ENCOUNTER — Telehealth: Payer: Self-pay | Admitting: Gastroenterology

## 2019-06-21 NOTE — Telephone Encounter (Signed)
Spoke with pt and gave him recommendations

## 2019-06-21 NOTE — Telephone Encounter (Signed)
Pt reported that Linzess is causing him to have diarrhea.  Please advise.

## 2019-06-21 NOTE — Telephone Encounter (Signed)
Called pt to tell him to stop linzess, only received a busy signal when I tried to call

## 2019-06-21 NOTE — Telephone Encounter (Signed)
Agree, please advise him to stop Linzess. Continue MiraLAX half capful 1-2 times daily as needed to have 1-2 soft bowel movements.

## 2019-06-22 ENCOUNTER — Other Ambulatory Visit: Payer: Self-pay

## 2019-06-22 ENCOUNTER — Telehealth: Payer: Self-pay | Admitting: Gastroenterology

## 2019-06-22 NOTE — Telephone Encounter (Signed)
Left message for Dawn at [facility] to check if they can help this patient.

## 2019-06-23 ENCOUNTER — Other Ambulatory Visit: Payer: Self-pay

## 2019-06-23 DIAGNOSIS — I5022 Chronic systolic (congestive) heart failure: Secondary | ICD-10-CM

## 2019-06-23 DIAGNOSIS — K5909 Other constipation: Secondary | ICD-10-CM

## 2019-06-23 DIAGNOSIS — R7309 Other abnormal glucose: Secondary | ICD-10-CM

## 2019-06-23 NOTE — Telephone Encounter (Signed)
Spoke with [facility]. Patient's insurance will cover services if he is diagnosed with kidney failure/disease or diabetes. Otherwise it becomes an out of pocket visit. Patient has a history of elevated glucose. This does not cover services by insurance standards.  Patient agrees to Hgb A1C. 20 minute phone call with the patient. He has a lot of questions about his diet and how this also affects his constipation. He is confused on how what meat he eats will affect him.  Patient is very interested in learning about a healthy diet.

## 2019-06-24 ENCOUNTER — Other Ambulatory Visit (INDEPENDENT_AMBULATORY_CARE_PROVIDER_SITE_OTHER): Payer: Managed Care, Other (non HMO)

## 2019-06-24 DIAGNOSIS — R7309 Other abnormal glucose: Secondary | ICD-10-CM | POA: Diagnosis not present

## 2019-06-24 LAB — HEMOGLOBIN A1C: Hgb A1c MFr Bld: 5.8 % (ref 4.6–6.5)

## 2019-06-27 NOTE — Telephone Encounter (Signed)
PT called back and he is asking to talk to you about he's nutrition because hes A1c came back in normal range.

## 2019-06-28 NOTE — Telephone Encounter (Signed)
Emailed information to the patient with dietary resources and a link to [employer]  classes

## 2019-07-05 ENCOUNTER — Telehealth: Payer: Self-pay | Admitting: Gastroenterology

## 2019-07-06 NOTE — Telephone Encounter (Signed)
Spoke with the patient about his fiber intake, his diet, and his constipation. He is learning what foods work for him. He is taking Miralax PRN with the goal of not having to strain for his bowel movements. He has found some foods such as blackberries cause him to have more or different bowel movements. He is feeling more confident in his food choices. He states he is feeling better about his overall health. Thanks me for my call

## 2019-07-06 NOTE — Telephone Encounter (Signed)
Pt calling regarding this msg.  °

## 2019-07-25 ENCOUNTER — Other Ambulatory Visit: Payer: Self-pay | Admitting: Cardiology

## 2019-07-25 ENCOUNTER — Other Ambulatory Visit: Payer: Self-pay

## 2019-07-25 ENCOUNTER — Encounter: Payer: Self-pay | Admitting: Allergy

## 2019-07-25 ENCOUNTER — Ambulatory Visit (INDEPENDENT_AMBULATORY_CARE_PROVIDER_SITE_OTHER): Payer: Managed Care, Other (non HMO) | Admitting: Allergy

## 2019-07-25 VITALS — BP 120/68 | HR 78 | Temp 97.2°F | Resp 16 | Ht 67.0 in | Wt 208.0 lb

## 2019-07-25 DIAGNOSIS — T781XXD Other adverse food reactions, not elsewhere classified, subsequent encounter: Secondary | ICD-10-CM | POA: Diagnosis not present

## 2019-07-25 DIAGNOSIS — J3089 Other allergic rhinitis: Secondary | ICD-10-CM | POA: Insufficient documentation

## 2019-07-25 DIAGNOSIS — T781XXA Other adverse food reactions, not elsewhere classified, initial encounter: Secondary | ICD-10-CM | POA: Insufficient documentation

## 2019-07-25 DIAGNOSIS — K5909 Other constipation: Secondary | ICD-10-CM

## 2019-07-25 NOTE — Progress Notes (Signed)
New Patient Note  RE: Jonathon Avila MRN: [id number] DOB: Feb 17, 1956 Date of Office Visit: 07/25/2019  Referring provider: No ref. provider found Primary care provider: Chesley Noon, MD  Chief Complaint: Food Intolerance (certain foods making him constipated) and Allergy Testing  History of Present Illness: I had the pleasure of seeing Jonathon Avila for initial evaluation at the [facility] on 07/25/2019. He is a 63 y.o. male, who is referred here by Chesley Noon, MD for the evaluation of food testing.   Patient had issues with constipation starting in September 2020 and was started on Miralax with some benefit.  Patient tried to eat lean meats with increased fruits and vegetables which has been helping.  Denies any nausea, vomiting or diarrhea.  Patient saw GI and planning on colonoscopy in January 20201. Tried linzess which caused some diarrhea.    Normal HgA1C and TSH in October 2020.   Dietary History: patient has been eating other foods including limited milk, limited eggs, limited sesame, seafood, soy, wheat, meats, fruits and vegetables.  Shellfish would cause vomiting in the past.   He reports reading labels and avoiding peanuts and tree nuts in diet completely. Not exactly sure of reactions in the past. He is concerned about food allergies and if anything he is eating currently is causing his constipation.   Assessment and Plan: Jonathon Avila is a 63 y.o. male with: Other constipation Patient started having constipation in September 2020. Denies any nausea, vomiting or diarrhea. Improved with Miralax and change in diet with increased fruits, vegetables and lean meats. Patient concerned about food allergies causing his constipation.   Discussed with patient at length that food allergies/intolerances usually do not cause constipation and they usually cause the opposite reaction with diarrhea and vomiting as the body is trying to get rid off  the allergen as quickly as possible.   No indication for any food testing for the constipation.  Encouraged patient to eat high fiber diet and follow recommendations as per GI.  Adverse food reaction History of vomiting after shellfish ingestion and currently avoiding shellfish, peanuts and tree nuts in diet completely. He is not sure what happened with the peanuts and tree nuts.  Offered patient testing for the shellfish, peanuts and tree nuts as they are more concerning for an IgE mediated reaction but this has nothing to do with his constipation.  Patient declined testing for the above foods at this time and will continue to avoid.  Continue to avoid shellfish, peanuts, tree nuts.  For mild symptoms you can take over the counter antihistamines such as Benadryl and monitor symptoms closely. If symptoms worsen or if you have severe symptoms including breathing issues, throat closure, significant swelling, whole body hives, severe diarrhea and vomiting, lightheadedness then seek immediate medical care.  Other allergic rhinitis Mild rhino conjunctivitis symptoms during the spring and fall. Takes Claritin, Flonase and Singulair with good benefit. Last skin testing in 2014 which was positive to multiple items per patient report - records not available for review.   May use over the counter antihistamines such as Zyrtec (cetirizine), Claritin (loratadine), Allegra (fexofenadine), or Xyzal (levocetirizine) daily as needed.  Continue Singulair 10mg  daily.  May use Flonase 1 spray per nostril 1-2 times a day as needed for sinus symptoms.   Return if symptoms worsen or fail to improve.  Other allergy screening: Asthma: no Rhino conjunctivitis: yes  Periorbital swelling as a child.  Mild symptoms in the spring and  fall. Takes Claritin daily, Flonase 1 spray daily, Singulair daily with good benefit.  As a child was diagnosed with dust mite, fall pollen allergies. In 2014 had skin testing  which was positive to pollen per patient report.  Medication allergy: yes  Penicillin- hypotension, palpitations had to go to ER. This happened about 1 year ago.  Codeine - upset stomach Hymenoptera allergy: no Urticaria: no Eczema:no History of recurrent infections suggestive of immunodeficency: no  Diagnostics: Skin Testing: None.  Past Medical History: Patient Active Problem List   Diagnosis Date Noted  . Other constipation 07/25/2019  . Adverse food reaction 07/25/2019  . Other allergic rhinitis 07/25/2019  . Colon cancer screening 07/29/2016  . Family history of colon cancer 07/29/2016  . Cardiac defibrillator -CRT-Medtronic 06/10/2011  . Hyperlipidemia   . LBBB (left bundle branch block)   . Nonischemic cardiomyopathy ([place])   . CHRONIC SYSTOLIC HEART FAILURE [id number]  . HYPERGLYCEMIA 02/21/2009   Past Medical History:  Diagnosis Date  . [phone] lead    6949 rate sense portion was replaced with a Medtronic 5076-lead 2010   . Allergy   . Chronic systolic CHF (congestive heart failure) ([place])   . Hyperlipidemia   . 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 LBBB (left bundle branch block)   . LV dysfunction   . Mitral insufficiency   . Nonischemic dilated cardiomyopathy ([place])   . Pulmonary hypertension ([place])    Past Surgical History: Past Surgical History:  Procedure Laterality Date  . CARDIAC CATHETERIZATION  10/25/2001   EF 65%  . CHOLECYSTECTOMY    . EP IMPLANTABLE DEVICE N/A 09/14/2015   Procedure:  ICD Generator Changeout;  Surgeon: Deboraha Sprang, MD;  Location: [facility];  Service: Cardiovascular;  Laterality: N/A;  . ICD     BIVENTRICULAR  . US ECHOCARDIOGRAPHY  02/19/2009   EF 45%  . wisdom teeth extraxtion     Medication List:  Current Outpatient Medications  Medication Sig Dispense Refill  . aspirin EC 81 MG tablet Take 1 tablet (81 mg total) by mouth daily. 90 tablet 3  . carvedilol (COREG) 25  MG tablet TAKE 1 TABLET BY MOUTH TWICE DAILY WITH  A  MEAL 180 tablet 2  . cholecalciferol (VITAMIN D) 1000 UNITS tablet Take 2,000 Units by mouth 2 (two) times daily.     [name] docusate sodium (COLACE) 100 MG capsule Take 1-2 capsules by mouth 2 (two) times daily as needed for constipation.    . fluticasone (FLONASE) 50 MCG/ACT nasal spray Place 2 sprays into the nose daily.    . furosemide (LASIX) 40 MG tablet TAKE ONE TABLET BY MOUTH AS NEEDED 30 tablet 1  . lisinopril (PRINIVIL,ZESTRIL) 5 MG tablet Take 5 mg by mouth daily.    [name] loratadine (CLARITIN) 10 MG tablet Take 1 tablet by mouth daily.    . montelukast (SINGULAIR) 10 MG tablet Take 10 mg by mouth at bedtime.    . simvastatin (ZOCOR) 20 MG tablet Take 1 tablet by mouth daily.    [name] spironolactone (ALDACTONE) 25 MG tablet TAKE 1 TABLET BY MOUTH AT BEDTIME 90 tablet 1   No current facility-administered medications for this visit.    Allergies: Allergies  Allergen Reactions  . Penicillins Anaphylaxis    Other reaction(s): Hypotension  . Codeine     Upset stomach   . Shellfish-Derived Products Nausea And Vomiting   Social History: Social History   Socioeconomic History  .  Marital status: Married    Spouse name: Not on file  . Number of children: 2  . Years of education: Not on file  . Highest education level: Not on file  Occupational History  . 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 Financial resource strain: Not on file  . Food insecurity    Worry: Not on file    Inability: Not on file  . Transportation needs    Medical: Not on file    Non-medical: Not on file  Tobacco Use  . Smoking status: Never Smoker  . Smokeless tobacco: Never Used  Substance and Sexual Activity  . Alcohol use: No  . Drug use: No  . Sexual activity: Not on file  Lifestyle  . Physical activity    Days per week: Not on file    Minutes per session: Not on file  . Stress: Not on file   Relationships  . Social Herbalist on phone: Not on file    Gets together: Not on file    Attends religious service: Not on file    Active member of club or organization: Not on file    Attends meetings of clubs or organizations: Not on file    Relationship status: Not on file  Other Topics Concern  . Not on file  Social History Narrative  . Not on file   Lives in a house. Smoking: denies Occupation: disabled  Environmental HistoryFreight forwarder in the house: no Carpet in the family room: no Carpet in the bedroom: no Heating: gas Cooling: central Pet: yes 2 dogs  Family History: Family History  Problem Relation Age of Onset  . COPD Mother        deceased  . Colon cancer Father        deceased age 10  . Prostate cancer Maternal Grandmother   . Esophageal cancer Neg Hx   . Pancreatic cancer Neg Hx   . Rectal cancer Neg Hx   . Stomach cancer Neg Hx    Problem                               Relation Asthma                                   No  Eczema                                No  Food allergy                          No  Allergic rhino conjunctivitis     No   Review of Systems  Constitutional: Negative for appetite change, chills, fever and unexpected weight change.  HENT: Negative for congestion and rhinorrhea.   Eyes: Negative for itching.  Respiratory: Negative for cough, chest tightness, shortness of breath and wheezing.   Cardiovascular: Negative for chest pain.  Gastrointestinal: Positive for constipation. Negative for abdominal pain.  Genitourinary: Negative for difficulty urinating.  Skin: Negative for rash.  Neurological: Negative for headaches.   Objective: BP 120/68 (BP Location: Left Arm, Patient Position: Sitting, Cuff Size: Normal)   Pulse 78   Temp (!) 97.2 F (  36.2 C) (Temporal)   Resp 16   Ht 5\' 7"  (1.702 m)   Wt 208 lb (94.3 kg)   SpO2 98%   BMI 32.58 kg/m  Body mass index is 32.58 kg/m. Physical Exam   Constitutional: He is oriented to person, place, and time. He appears well-developed and well-nourished.  HENT:  Head: Normocephalic and atraumatic.  Right Ear: External ear normal.  Left Ear: External ear normal.  Nose: Nose normal.  Mouth/Throat: Oropharynx is clear and moist.  Eyes: Conjunctivae and EOM are normal.  Neck: Neck supple.  Cardiovascular: Normal rate, regular rhythm and normal heart sounds. Exam reveals no gallop and no friction rub.  No murmur heard. Pulmonary/Chest: Effort normal and breath sounds normal. He has no wheezes. He has no rales.  Abdominal: Soft.  Neurological: He is alert and oriented to person, place, and time.  Skin: Skin is warm. No rash noted.  Psychiatric: He has a normal mood and affect. His behavior is normal.  Nursing note and vitals reviewed.  The plan was reviewed with the patient/family, and all questions/concerned were addressed.  It was my pleasure to see Jonathon Avila today and participate in his care. Please feel free to contact me with any questions or concerns.  Sincerely,  Rexene Alberts, DO Allergy & Immunology  [facility] office: [phone] [facility] office: [place] office: [phone]

## 2019-07-25 NOTE — Patient Instructions (Addendum)
I do not think your constipation  is due to any food allergies.  No need for any food testing.   Continue to avoid shellfish, peanuts, tree nuts based on your past clinical reactions with vomiting.  For mild symptoms you can take over the counter antihistamines such as Benadryl and monitor symptoms closely. If symptoms worsen or if you have severe symptoms including breathing issues, throat closure, significant swelling, whole body hives, severe diarrhea and vomiting, lightheadedness then seek immediate medical care.  Follow up as needed.   Follow your GI doctors recommendation for the constipation.

## 2019-07-25 NOTE — Assessment & Plan Note (Signed)
History of vomiting after shellfish ingestion and currently avoiding shellfish, peanuts and tree nuts in diet completely. He is not sure what happened with the peanuts and tree nuts.  Offered patient testing for the shellfish, peanuts and tree nuts as they are more concerning for an IgE mediated reaction but this has nothing to do with his constipation.  Patient declined testing for the above foods at this time and will continue to avoid.  Continue to avoid shellfish, peanuts, tree nuts.  For mild symptoms you can take over the counter antihistamines such as Benadryl and monitor symptoms closely. If symptoms worsen or if you have severe symptoms including breathing issues, throat closure, significant swelling, whole body hives, severe diarrhea and vomiting, lightheadedness then seek immediate medical care.

## 2019-07-25 NOTE — Assessment & Plan Note (Signed)
Mild rhino conjunctivitis symptoms during the spring and fall. Takes Claritin, Flonase and Singulair with good benefit. Last skin testing in 2014 which was positive to multiple items per patient report - records not available for review.   May use over the counter antihistamines such as Zyrtec (cetirizine), Claritin (loratadine), Allegra (fexofenadine), or Xyzal (levocetirizine) daily as needed.  Continue Singulair 10mg  daily.  May use Flonase 1 spray per nostril 1-2 times a day as needed for sinus symptoms.

## 2019-07-25 NOTE — Assessment & Plan Note (Signed)
Patient started having constipation in September 2020. Denies any nausea, vomiting or diarrhea. Improved with Miralax and change in diet with increased fruits, vegetables and lean meats. Patient concerned about food allergies causing his constipation.   Discussed with patient at length that food allergies/intolerances usually do not cause constipation and they usually cause the opposite reaction with diarrhea and vomiting as the body is trying to get rid off the allergen as quickly as possible.   No indication for any food testing for the constipation.  Encouraged patient to eat high fiber diet and follow recommendations as per GI.

## 2019-08-15 NOTE — Progress Notes (Deleted)
Virtual Visit via Video Note   This visit type was conducted due to national recommendations for restrictions regarding the COVID-19 Pandemic (e.g. social distancing) in an effort to limit this patient's exposure and mitigate transmission in our community.  Due to his co-morbid illnesses, this patient is at least at moderate risk for complications without adequate follow up.  This format is felt to be most appropriate for this patient at this time.  All issues noted in this document were discussed and addressed.  A limited physical exam was performed with this format.  Please refer to the patient's chart for his consent to telehealth for [facility].   Date:  08/15/2019   ID:  Jonathon Avila, DOB 01/25/1956, MRN [id number]  Patient Location: Home Provider Location: Office  PCP:  Chesley Noon, MD  Cardiologist:  Tomeika Weinmann Martin[place] MD Electrophysiologist:  None   Evaluation Performed:  Follow-Up Visit  Chief Complaint:  Follow up CHF  History of Present Illness:    Jonathon Avila is a 63 y.o. male with a history of nonischemic cardiomyopathy with systolic congestive heart failure. He is status post biventricular ICD. This did result in significant improvement in LV function. Echo was last check in March 2018 and was unchanged from 2014 with EF 45-50%. He underwent a generator change out of his ICD on [id number] without complications. Last ICD check in 05/23/19 was satisfactory with normal Optivol and 99% BiV pacing. 7 beat run NSVT.   On follow up today he is doing well. He did join a gym in January but hasn't been able to go with the pandemic. he is staying at home and social distancing. He is doing some walking. Did initially gain weight but this has come down with better attention to diet.  Denies any increase edema.   He denies dyspnea or chest pain. No palpitations or dizziness.   The patient does not have symptoms concerning for COVID-19 infection (fever, chills, cough, or new  shortness of breath).    Past Medical History:  Diagnosis Date  . [phone] lead    6949 rate sense portion was replaced with a Medtronic 5076-lead 2010   . Allergy   . Chronic systolic CHF (congestive heart failure) ([place])   . Hyperlipidemia   . 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 LBBB (left bundle branch block)   . LV dysfunction   . Mitral insufficiency   . Nonischemic dilated cardiomyopathy ([place])   . Pulmonary hypertension ([place])    Past Surgical History:  Procedure Laterality Date  . CARDIAC CATHETERIZATION  10/25/2001   EF 65%  . CHOLECYSTECTOMY    . EP IMPLANTABLE DEVICE N/A 09/14/2015   Procedure:  ICD Generator Changeout;  Surgeon: Deboraha Sprang, MD;  Location: [place] [facility];  Service: Cardiovascular;  Laterality: N/A;  . ICD     BIVENTRICULAR  . US ECHOCARDIOGRAPHY  02/19/2009   EF 45%  . wisdom teeth extraxtion       No outpatient medications have been marked as taking for the 08/18/19 encounter (Appointment) with Avila, Jonathon Buckwalter M, MD.     Allergies:   Penicillins, Codeine, and Shellfish-derived products   Social History   Tobacco Use  . Smoking status: Never Smoker  . Smokeless tobacco: Never Used  Substance Use Topics  . Alcohol use: No  . Drug use: No     Family Hx: The patient's family history includes COPD in his mother; Colon cancer in  his father; Prostate cancer in his maternal grandmother. There is no history of Esophageal cancer, Pancreatic cancer, Rectal cancer, or Stomach cancer.  ROS:   Please see the history of present illness.    All other systems reviewed and are negative.   Prior CV studies:   The following studies were reviewed today:  Echo 11/14/16: Study Conclusions  - Left ventricle: The cavity size was normal. Wall thickness was increased in a pattern of mild LVH. Systolic function was mildly reduced. The estimated ejection fraction was in the range of 45% to 50%.  Incoordinate septal motion. Doppler parameters are consistent with abnormal left ventricular relaxation (grade 1 diastolic dysfunction). The E/e&' ratio is between 8-15, suggesting indeterminate LV filling pressure. - Left atrium: The atrium was normal in size. - Right ventricle: The cavity size was normal. Wall thickness was normal. Pacer wire or catheter noted in right ventricle. Systolic function was normal. - Right atrium: The atrium was mildly dilated. Pacer wire or catheter noted in right atrium. - Atrial septum: No defect or patent foramen ovale was identified. - Tricuspid valve: There was mild regurgitation. - Pulmonary arteries: PA peak pressure: 27 mm Hg (S). - Inferior vena cava: The vessel was normal in size. The respirophasic diameter changes were in the normal range (>= 50%), consistent with normal central venous pressure.  Impressions:  - Compared to a previous study in 2014, there has been no change.   Labs/Other Tests and Data Reviewed:    EKG:  No ECG reviewed.  Recent Labs: 09/24/2018: BUN 11; Creatinine, Ser 0.82; Hemoglobin 14.2; Platelets 231; Potassium 4.5; Sodium 139 06/16/2019: TSH 0.63   Dated 07/21/18: normal CMET and CBC. Cholesterol 109, triglycerides 86, HDL 34, LDL 58.   Recent Lipid Panel Lab Results  Component Value Date/Time   CHOL  10/21/2009 02:20 AM    112        ATP III CLASSIFICATION:  <200     mg/dL   Desirable  200-239  mg/dL   Borderline High  >=240    mg/dL   High          TRIG 129 10/21/2009 02:20 AM   HDL 34 (L) 10/21/2009 02:20 AM   CHOLHDL 3.3 10/21/2009 02:20 AM   LDLCALC  10/21/2009 02:20 AM    52        Total Cholesterol/HDL:CHD Risk Coronary Heart Disease Risk Table                     Men   Women  1/2 Average Risk   3.4   3.3  Average Risk       5.0   4.4  2 X Average Risk   9.6   7.1  3 X Average Risk  23.4   11.0        Use the calculated Patient Ratio above and the CHD Risk Table to  determine the patient's CHD Risk.        ATP III CLASSIFICATION (LDL):  <100     mg/dL   Optimal  100-129  mg/dL   Near or Above                    Optimal  130-159  mg/dL   Borderline  160-189  mg/dL   High  >190     mg/dL   Very High    Wt Readings from Last 3 Encounters:  07/25/19 208 lb (94.3 kg)  06/16/19 228 lb (103.4 kg)  02/14/19 246 lb 3.2 oz (111.7 kg)     Objective:    Vital Signs:  There were no vitals taken for this visit.   VITAL SIGNS:  reviewed  General no distress HEENT. Wears glasses. Sclera are clear Respirations: unlabored. Skin clear Neuro alert and oriented x 3. Nonfocal. Mood normal  ASSESSMENT & PLAN:    1. Chronic systolic congestive heart failure. Last Ejection fraction of 45-50% in March 2018- unchanged from 2014. He has class 1-2 symptoms. He is on optimal medical therapy with an ACE inhibitor, carvedilol, and Aldactone.  He has a biventricular pacemaker in place. Continue current therapy. Encourage continued aerobic activity.  2. Status post ICD/biventricular pacemaker. Continue followup in EP clinic. S/p generator change out Jan 2017. ICD check in March was good.    3. Obesity.he has lost weight. Encourage dietary modification.  4. Left bundle branch block.  COVID-19 Education: The signs and symptoms of COVID-19 were discussed with the patient and how to seek care for testing (follow up with PCP or arrange E-visit).  The importance of social distancing was discussed today.  Time:   Today, I have spent 15 minutes with the patient with telehealth technology discussing the above problems.     Medication Adjustments/Labs and Tests Ordered: Current medicines are reviewed at length with the patient today.  Concerns regarding medicines are outlined above.   Tests Ordered: No orders of the defined types were placed in this encounter.   Medication Changes: No orders of the defined types were placed in this encounter.   Disposition:   Follow up in 6 month(s)  Signed, Ladarion Munyon Martin[place], MD  08/15/2019 7:35 AM    [facility]

## 2019-08-16 NOTE — Progress Notes (Signed)
Jonathon Avila Date of Birth: [birth year]   History of Present Illness: Jonathon Avila is seen  today for followup of CHF. 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 He is status post biventricular ICD. This did result in significant improvement in LV function. Echo was last check in March 2018 and was unchanged from 2014 with EF 45-50%. He underwent a generator change out of his ICD on [id number] without complications. Last ICD check in 05/13/19 was satisfactory with normal Optivol and 99% BiV pacing. 7 beat run of NSVT.   On follow up today he is doing very well. He developed chronic constipation and completely changed his diet. Now only eating lean meats and vegetables. Cut out sodas and snack foods. Has lost over 30 lbs. He is walking daily.   Current Outpatient Medications on File Prior to Visit  Medication Sig Dispense Refill  . aspirin EC 81 MG tablet Take 1 tablet (81 mg total) by mouth daily. 90 tablet 3  . carvedilol (COREG) 25 MG tablet TAKE 1 TABLET BY MOUTH TWICE DAILY WITH A MEAL 180 tablet 0  . cholecalciferol (VITAMIN D) 1000 UNITS tablet Take 2,000 Units by mouth 2 (two) times daily.     [name] docusate sodium (COLACE) 100 MG capsule Take 1-2 capsules by mouth 2 (two) times daily as needed for constipation.    . fluticasone (FLONASE) 50 MCG/ACT nasal spray Place 2 sprays into the nose daily.    . furosemide (LASIX) 40 MG tablet TAKE ONE TABLET BY MOUTH AS NEEDED 30 tablet 1  . lisinopril (PRINIVIL,ZESTRIL) 5 MG tablet Take 5 mg by mouth daily.    [name] loratadine (CLARITIN) 10 MG tablet Take 1 tablet by mouth daily.    . montelukast (SINGULAIR) 10 MG tablet Take 10 mg by mouth at bedtime.    [name] Peppermint Oil (IBGARD) 90 MG CPCR Take by mouth.    . simvastatin (ZOCOR) 20 MG tablet Take 1 tablet by mouth daily.    [name] spironolactone (ALDACTONE) 25 MG tablet TAKE 1 TABLET BY MOUTH AT BEDTIME 90 tablet 1   No current facility-administered medications on  file prior to visit.     Allergies  Allergen Reactions  . Penicillins Anaphylaxis    Other reaction(s): Hypotension  . Codeine     Upset stomach   . Shellfish-Derived Products Nausea And Vomiting    Past Medical History:  Diagnosis Date  . [phone] lead    6949 rate sense portion was replaced with a Medtronic 5076-lead 2010   . Allergy   . Chronic systolic CHF (congestive heart failure) ([place])   . Hyperlipidemia   . 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 LBBB (left bundle branch block)   . LV dysfunction   . Mitral insufficiency   . Nonischemic dilated cardiomyopathy ([place])   . Pulmonary hypertension ([place])     Past Surgical History:  Procedure Laterality Date  . CARDIAC CATHETERIZATION  10/25/2001   EF 65%  . CHOLECYSTECTOMY    . EP IMPLANTABLE DEVICE N/A 09/14/2015   Procedure:  ICD Generator Changeout;  Surgeon: Deboraha Sprang, MD;  Location: [place] [facility];  Service: Cardiovascular;  Laterality: N/A;  . ICD     BIVENTRICULAR  . US ECHOCARDIOGRAPHY  02/19/2009   EF 45%  . wisdom teeth extraxtion      Social History   Tobacco Use  Smoking Status Never Smoker  Smokeless Tobacco Never Used  Social History   Substance and Sexual Activity  Alcohol Use No    Family History  Problem Relation Age of Onset  . COPD Mother        deceased  . Colon cancer Father        deceased age 53  . Prostate cancer Maternal Grandmother   . Esophageal cancer Neg Hx   . Pancreatic cancer Neg Hx   . Rectal cancer Neg Hx   . Stomach cancer Neg Hx     Review of Systems: As noted in history of present illness.  All other systems were reviewed and are negative.  Physical Exam: BP 130/77   Pulse 69   Temp (!) 96.9 F (36.1 C)   Ht 5\' 7"  (1.702 m)   Wt 200 lb (90.7 kg)   SpO2 96%   BMI 31.32 kg/m  GENERAL:  Well appearing WM in NAD HEENT:  PERRL, EOMI, sclera are clear. Oropharynx is clear. NECK:  No jugular venous  distention, carotid upstroke brisk and symmetric, no bruits, no thyromegaly or adenopathy LUNGS:  Clear to auscultation bilaterally CHEST:  Unremarkable, ICD in left upper chest HEART:  RRR,  PMI not displaced or sustained,S1 and S2 within normal limits, no S3, no S4: no clicks, no rubs, no murmurs ABD:  Soft, nontender. BS +, no masses or bruits. No hepatomegaly, no splenomegaly EXT:  2 + pulses throughout, no edema, no cyanosis no clubbing SKIN:  Warm and dry.  No rashes NEURO:  Alert and oriented x 3. Cranial nerves II through XII intact. PSYCH:  Cognitively intact  LABORATORY DATA:  Lab Results  Component Value Date   WBC 11.2 (H) 09/24/2018   HGB 14.2 09/24/2018   HCT 41.3 09/24/2018   PLT 231 09/24/2018   GLUCOSE 107 (H) 09/24/2018   CHOL  10/21/2009    112        ATP III CLASSIFICATION:  <200     mg/dL   Desirable  200-239  mg/dL   Borderline High  >=240    mg/dL   High          TRIG 129 10/21/2009   HDL 34 (L) 10/21/2009   LDLCALC  10/21/2009    52        Total Cholesterol/HDL:CHD Risk Coronary Heart Disease Risk Table                     Men   Women  1/2 Average Risk   3.4   3.3  Average Risk       5.0   4.4  2 X Average Risk   9.6   7.1  3 X Average Risk  23.4   11.0        Use the calculated Patient Ratio above and the CHD Risk Table to determine the patient's CHD Risk.        ATP III CLASSIFICATION (LDL):  <100     mg/dL   Optimal  100-129  mg/dL   Near or Above                    Optimal  130-159  mg/dL   Borderline  160-189  mg/dL   High  >190     mg/dL   Very High   ALT 23 05/13/2015   AST 22 05/13/2015   NA 139 09/24/2018   K 4.5 09/24/2018   CL 99 09/24/2018   CREATININE 0.82 09/24/2018   BUN 11 09/24/2018  CO2 22 09/24/2018   TSH 0.63 06/16/2019   INR 1.1 ratio (H) 04/26/2009   HGBA1C 5.8 06/24/2019   Labs reviewed from primary care 04/03/16: CMET and CBC normal. Cholesterol 116, trig- 132, HDL 31, LDL 59. BNP 14.1. Jul 04, 2016: A1c  5.7%. Oct 07, 2016 A1c 5.6%.  Dated 04/15/17: glucose 115. A1c 5.6%. Otherwise CMET and CBC normal. Cholesterol 120, triglycerides 109, HDL 35, LDL 63.  Dated 10/05/17: glucose 152, otherwise CBC and CMET normal. Cholesterol 108, triglycerides 152, HDL 32, LDL 46 Dated 04/14/18: cholesterol 116, triglycerides 114, HDL 32, LDL 61. CMET normal.  Dated 06/10/19: normal CBC and CMET.   Ecg today shows BiV pacing rate 65. I have personally reviewed and interpreted this study.   Echo 11/14/16: Study Conclusions  - Left ventricle: The cavity size was normal. Wall thickness was   increased in a pattern of mild LVH. Systolic function was mildly   reduced. The estimated ejection fraction was in the range of 45%   to 50%. Incoordinate septal motion. Doppler parameters are   consistent with abnormal left ventricular relaxation (grade 1   diastolic dysfunction). The E/e&' ratio is between 8-15,   suggesting indeterminate LV filling pressure. - Left atrium: The atrium was normal in size. - Right ventricle: The cavity size was normal. Wall thickness was   normal. Pacer wire or catheter noted in right ventricle. Systolic   function was normal. - Right atrium: The atrium was mildly dilated. Pacer wire or   catheter noted in right atrium. - Atrial septum: No defect or patent foramen ovale was identified. - Tricuspid valve: There was mild regurgitation. - Pulmonary arteries: PA peak pressure: 27 mm Hg (S). - Inferior vena cava: The vessel was normal in size. The   respirophasic diameter changes were in the normal range (>= 50%),   consistent with normal central venous pressure.  Impressions:  - Compared to a previous study in 2014, there has been no change.  Assessment / Plan: 1. Chronic systolic congestive heart failure. Last Ejection fraction of 45-50% in March 2018- unchanged from 2014. He has class 1 symptoms. He is on optimal medical therapy with an ACE inhibitor, carvedilol, and Aldactone.  He has  a biventricular pacemaker in place. Continue current therapy. I am very pleased with his weight loss.   2. Status post ICD/biventricular pacemaker. Continue followup in EP clinic. S/p generator change out Jan 2017. ICD check in September was good.    3. Obesity.he has lost weight. Encourage dietary modification.  4. Left bundle branch block.  Follow up in 6 months

## 2019-08-17 ENCOUNTER — Encounter: Payer: Self-pay | Admitting: Cardiology

## 2019-08-17 ENCOUNTER — Other Ambulatory Visit: Payer: Self-pay

## 2019-08-17 ENCOUNTER — Ambulatory Visit (INDEPENDENT_AMBULATORY_CARE_PROVIDER_SITE_OTHER): Payer: Managed Care, Other (non HMO) | Admitting: Cardiology

## 2019-08-17 VITALS — BP 130/77 | HR 69 | Temp 96.9°F | Ht 67.0 in | Wt 200.0 lb

## 2019-08-17 DIAGNOSIS — I5022 Chronic systolic (congestive) heart failure: Secondary | ICD-10-CM

## 2019-08-17 DIAGNOSIS — I428 Other cardiomyopathies: Secondary | ICD-10-CM

## 2019-08-17 DIAGNOSIS — E7800 Pure hypercholesterolemia, unspecified: Secondary | ICD-10-CM

## 2019-08-17 DIAGNOSIS — I447 Left bundle-branch block, unspecified: Secondary | ICD-10-CM

## 2019-08-17 DIAGNOSIS — Z95810 Presence of automatic (implantable) cardiac defibrillator: Secondary | ICD-10-CM | POA: Diagnosis not present

## 2019-08-18 ENCOUNTER — Telehealth: Payer: Medicare Other | Admitting: Cardiology

## 2019-08-18 ENCOUNTER — Ambulatory Visit: Payer: Managed Care, Other (non HMO) | Admitting: Cardiology

## 2019-08-22 ENCOUNTER — Ambulatory Visit (INDEPENDENT_AMBULATORY_CARE_PROVIDER_SITE_OTHER): Payer: Managed Care, Other (non HMO) | Admitting: *Deleted

## 2019-08-22 ENCOUNTER — Telehealth: Payer: Self-pay

## 2019-08-22 DIAGNOSIS — I5022 Chronic systolic (congestive) heart failure: Secondary | ICD-10-CM | POA: Diagnosis not present

## 2019-08-22 LAB — CUP PACEART REMOTE DEVICE CHECK
Battery Remaining Longevity: 43 mo
Battery Voltage: 2.96 V
Brady Statistic AP VP Percent: 50.48 %
Brady Statistic AP VS Percent: 0.05 %
Brady Statistic AS VP Percent: 49.47 %
Brady Statistic AS VS Percent: 0.01 %
Brady Statistic RA Percent Paced: 50.52 %
Brady Statistic RV Percent Paced: 99.91 %
Date Time Interrogation Session: 20201214023326
HighPow Impedance: 54 Ohm
HighPow Impedance: 83 Ohm
Implantable Lead Implant Date: 20050120
Implantable Lead Implant Date: 20050120
Implantable Lead Implant Date: 20100826
Implantable Lead Location: 753858
Implantable Lead Location: 753859
Implantable Lead Location: 753860
Implantable Lead Model: 4194
Implantable Lead Model: 5076
Implantable Lead Model: 5076
Implantable Pulse Generator Implant Date: 20170106
Lead Channel Impedance Value: 304 Ohm
Lead Channel Impedance Value: 418 Ohm
Lead Channel Impedance Value: 513 Ohm
Lead Channel Impedance Value: 646 Ohm
Lead Channel Impedance Value: 646 Ohm
Lead Channel Impedance Value: 817 Ohm
Lead Channel Pacing Threshold Amplitude: 0.5 V
Lead Channel Pacing Threshold Amplitude: 0.75 V
Lead Channel Pacing Threshold Amplitude: 0.875 V
Lead Channel Pacing Threshold Pulse Width: 0.4 ms
Lead Channel Pacing Threshold Pulse Width: 0.4 ms
Lead Channel Pacing Threshold Pulse Width: 0.4 ms
Lead Channel Sensing Intrinsic Amplitude: 3 mV
Lead Channel Sensing Intrinsic Amplitude: 3 mV
Lead Channel Sensing Intrinsic Amplitude: 8.125 mV
Lead Channel Sensing Intrinsic Amplitude: 8.125 mV
Lead Channel Setting Pacing Amplitude: 1.5 V
Lead Channel Setting Pacing Amplitude: 2 V
Lead Channel Setting Pacing Amplitude: 2 V
Lead Channel Setting Pacing Pulse Width: 0.4 ms
Lead Channel Setting Pacing Pulse Width: 0.4 ms
Lead Channel Setting Sensing Sensitivity: 0.45 mV

## 2019-08-22 NOTE — Telephone Encounter (Signed)
I told the pt Jonathon Avila will call him back. The best number to reach the pt is [phone].

## 2019-08-22 NOTE — Telephone Encounter (Signed)
Pt returning [name]'s phone call.

## 2019-08-22 NOTE — Telephone Encounter (Signed)
Opened in error. Mr. Helmkamp called back about message left for wife, Letta Median. See phone note from 08/22/19.

## 2019-08-24 ENCOUNTER — Telehealth: Payer: Self-pay

## 2019-08-24 DIAGNOSIS — Z95810 Presence of automatic (implantable) cardiac defibrillator: Secondary | ICD-10-CM

## 2019-08-24 DIAGNOSIS — I428 Other cardiomyopathies: Secondary | ICD-10-CM

## 2019-08-24 DIAGNOSIS — I5022 Chronic systolic (congestive) heart failure: Secondary | ICD-10-CM

## 2019-08-24 NOTE — Telephone Encounter (Signed)
Spoke to patient about recent email.Dr.Jordan advised he needs a echo in 1 year.Scheduler will call back to schedule.

## 2019-08-25 ENCOUNTER — Encounter: Payer: Self-pay | Admitting: Dietician

## 2019-08-25 ENCOUNTER — Encounter: Payer: Managed Care, Other (non HMO) | Attending: Family Medicine | Admitting: Dietician

## 2019-08-25 ENCOUNTER — Other Ambulatory Visit: Payer: Self-pay

## 2019-08-25 ENCOUNTER — Encounter

## 2019-08-25 DIAGNOSIS — I5022 Chronic systolic (congestive) heart failure: Secondary | ICD-10-CM | POA: Diagnosis not present

## 2019-08-25 DIAGNOSIS — K5909 Other constipation: Secondary | ICD-10-CM | POA: Insufficient documentation

## 2019-08-25 DIAGNOSIS — Z6830 Body mass index (BMI) 30.0-30.9, adult: Secondary | ICD-10-CM | POA: Diagnosis not present

## 2019-08-25 DIAGNOSIS — Z713 Dietary counseling and surveillance: Secondary | ICD-10-CM | POA: Diagnosis present

## 2019-08-25 DIAGNOSIS — R7303 Prediabetes: Secondary | ICD-10-CM

## 2019-08-25 DIAGNOSIS — K581 Irritable bowel syndrome with constipation: Secondary | ICD-10-CM

## 2019-08-25 DIAGNOSIS — R739 Hyperglycemia, unspecified: Secondary | ICD-10-CM | POA: Diagnosis not present

## 2019-08-25 NOTE — Progress Notes (Signed)
Medical Nutrition Therapy:  Appt start time: 1430 end time:  1550.   Assessment:  Primary concerns today: Patient is here today alone.  He was referred for obesity, abnormal glucose, IBS with chronic constipation, and chronic systolic heart failure.  He takes Miralax as needed (about once per week) and would like to know what to eat for the IBS constipation.   A1C noted 06/24/2019 5.8% which meets criteria for prediabetes.   Weight hx: 195 lbs 08/25/2019 He has lost weight both purposeful as well as restrictions related to IBS 240 lbs 05/2019  Patient and wife live with their oldest daughter.  His daughter and wife do most of the shopping and cooking.   Walks 30 minutes per day when possible.  Preferred Learning Style:   No preference indicated   Learning Readiness:   Ready  Change in progress   MEDICATIONS: see list to include lasix, peppermint oil  and vitamin D   DIETARY INTAKE: He does not tolerate shellfish (nausea/vomiting).  Used to be allergic to nuts.   Currently avoids nuts.  Generally avoids dairy as this causes constipation. He has been trying the low fodmap diet with information that he has obtained on the Internet Little red meat as it causes constipation Uses salt sparingly.    24-hr recall:  B ( AM): 2 slices [employer] toast, strawberry jam, Hot tea with Monia Pouch  Snk ( AM): mandarin orange  L ( PM): leftovers or [place] sandwich on [employer] OR [place] [employer] sub with [place], ham and salami (3 ") Snk ( PM): strawberries or unsweetened applesauce D ( PM): chicken or fish, green beans or carrots, potatoes (red skin) Snk ( PM): Toast with jelly or 3-4 pretzels (now mostly avoids as "this is a processed food and does not have health benefits") Beverages: 40-45 ounces of fluid per day (concerned that fluid just makes him full), water, hot tea with truvia  Usual physical activity:   Progress Towards Goal(s):  In progress.   Nutritional Diagnosis:  NB-1.1 Food and  nutrition-related knowledge deficit As related to balance of carbohydrate, protein, and fat.  As evidenced by diet hx.    Intervention:  Nutrition education related to prediabetes as well as IBS. Discussed the benefits and limitations of the Low Fodmap diet and that this is for temporary use only.  Plan: Avoid anything with High Fructose Corn Syrup. Continue to stay active. Continue a low fat, low sodium diet that is low in processed foods. Continue to eat whole grains. Find ways to increase your fiber intake slowly. Be sure that you are drinking adequate fluid.   Consider trying sweet potatoes. Continue to avoid dairy.  You could try [place].  If you don't tolerate then avoid. Continue to limit or avoid red meat. Consider a probiotic.  Claudia Desanctis and [place] both have probiotics as well. Increase the variety of vegetables and fruit. Listen to your body.  Teaching Method Utilized:  Visual Auditory  Handouts given during visit include:  IBS nutrition therapy from AND  IBS meal planning tips from AND  My plate  Barriers to learning/adherence to lifestyle change: unknown  Demonstrated degree of understanding via:  Teach Back   Monitoring/Evaluation:  Dietary intake, exercise, and body weight prn.

## 2019-08-25 NOTE — Patient Instructions (Signed)
Avoid anything with High Fructose Corn Syrup. Continue to stay active. Continue a low fat, low sodium diet that is low in processed foods. Continue to eat whole grains. Find ways to increase your fiber intake slowly. Be sure that you are drinking adequate fluid.   Consider trying sweet potatoes. Continue to avoid dairy.  You could try [place].  If you don't tolerate then avoid. Continue to limit or avoid red meat. Consider a probiotic.  Jonathon Avila and Jonathon Avila both have probiotics as well. Increase the variety of vegetables and fruit. Listen to your body.

## 2019-09-08 ENCOUNTER — Telehealth: Payer: Self-pay

## 2019-09-08 ENCOUNTER — Ambulatory Visit
Admission: RE | Admit: 2019-09-08 | Discharge: 2019-09-08 | Disposition: A | Discharge status: Home / Self Care | Payer: Managed Care, Other (non HMO) | Source: Ambulatory Visit | Attending: Cardiology | Admitting: Cardiology

## 2019-09-08 ENCOUNTER — Other Ambulatory Visit: Payer: Self-pay

## 2019-09-08 DIAGNOSIS — R0789 Other chest pain: Secondary | ICD-10-CM

## 2019-09-08 NOTE — Telephone Encounter (Signed)
We can get a chest Xray to evaluate his sternal pain.  Alain Deschene Martinique MD, [facility]

## 2019-09-08 NOTE — Telephone Encounter (Signed)
Spoke to patient about recent email.Dr.Jordan advised needs to have a chest xray.He will have done at [place] this morning.Order placed.

## 2019-09-25 NOTE — Progress Notes (Signed)
ICD remote 

## 2019-10-03 NOTE — Progress Notes (Signed)
Cardiology Office Note Date:  10/05/2019  Patient ID:  Tayseer, Barile 1956-03-05, MRN [id number] PCP:  Chesley Noon, MD  Cardiologist:  Dr. Martinique Electrophysiologist: Dr.Klein    Chief Complaint: annual EP visit  History of Present Illness: GENIE SCHELER is a 64 y.o. male with history of NICM, chronic CHF (systolic) w/CRT-D and subsequent improvement in his LVEF, HLD, LBBB  He comes in today to be seen for Dr.Klein, last seen by him Jan 2020. Described class II symptoms, noc hanges were made to his device or medicines. More recently he saw Dr. Martinique, Dec 2020, he was doing well though struggling with constipation.  No changes were made to his medicines.  He is doing well.  Walks 30 minutes a day for exercise, is active, not as much in the winter as he is in the summer.  No CP, palpitations or cardiac awareness, no dizzy spells, no near syncope or syncope.  He is down 20lbs, this was semi-unintentionally though was aggressively limiting his food choices and got down to <100cal day trying to combat his constipation.  He with the help of his PMD office though has now adjusted his intake to a better balanced regime and is up to about 1300-1500cal/day  Device information MDT CRT-D, RA/RV leads are from 2005, LV lead is from 2010, current generator 2017 She has an abandoned 6949 lead in RV   Past Medical History:  Diagnosis Date  . [phone] lead    6949 rate sense portion was replaced with a Medtronic 5076-lead 2010   . Allergy   . Chronic systolic CHF (congestive heart failure) ([place])   . Hyperlipidemia   . 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 LBBB (left bundle branch block)   . LV dysfunction   . Mitral insufficiency   . Nonischemic dilated cardiomyopathy ([place])   . Pulmonary hypertension ([place])     Past Surgical History:  Procedure Laterality Date  . CARDIAC CATHETERIZATION  10/25/2001   EF 65%  .  CHOLECYSTECTOMY    . EP IMPLANTABLE DEVICE N/A 09/14/2015   Procedure:  ICD Generator Changeout;  Surgeon: Deboraha Sprang, MD;  Location: [facility];  Service: Cardiovascular;  Laterality: N/A;  . ICD     BIVENTRICULAR  . US ECHOCARDIOGRAPHY  02/19/2009   EF 45%  . wisdom teeth extraxtion      Current Outpatient Medications  Medication Sig Dispense Refill  . aspirin EC 81 MG tablet Take 1 tablet (81 mg total) by mouth daily. 90 tablet 3  . carvedilol (COREG) 25 MG tablet TAKE 1 TABLET BY MOUTH TWICE DAILY WITH A MEAL 180 tablet 0  . cholecalciferol (VITAMIN D) 1000 UNITS tablet Take 2,000 Units by mouth 2 (two) times daily.     [name] docusate sodium (COLACE) 100 MG capsule Take 1-2 capsules by mouth 2 (two) times daily as needed for constipation.    [name] FLUoxetine (PROZAC) 10 MG tablet Take 10 mg by mouth as needed.    . fluticasone (FLONASE) 50 MCG/ACT nasal spray Place 2 sprays into the nose daily.    . furosemide (LASIX) 40 MG tablet TAKE ONE TABLET BY MOUTH AS NEEDED 30 tablet 1  . lisinopril (PRINIVIL,ZESTRIL) 5 MG tablet Take 5 mg by mouth daily.    [name] loratadine (CLARITIN) 10 MG tablet Take 1 tablet by mouth daily.    . montelukast (SINGULAIR) 10 MG tablet Take 10 mg by mouth at bedtime.    [name]  Peppermint Oil (IBGARD) 90 MG CPCR Take by mouth.    . simvastatin (ZOCOR) 20 MG tablet Take 1 tablet by mouth daily.    [name] spironolactone (ALDACTONE) 25 MG tablet TAKE 1 TABLET BY MOUTH AT BEDTIME 90 tablet 1   No current facility-administered medications for this visit.    Allergies:   Penicillins, Codeine, and Shellfish-derived products   Social History:  The patient  reports that he has never smoked. He has never used smokeless tobacco. He reports that he does not drink alcohol or use drugs.   Family History:  The patient's family history includes COPD in his mother; Colon cancer in his father; Prostate cancer in his maternal grandmother.  ROS:  Please see the history of present  illness.  All other systems are reviewed and otherwise negative.   PHYSICAL EXAM:  VS:  BP 96/62   Pulse 86   Ht 5\' 7"  (1.702 m)   Wt 180 lb (81.6 kg)   SpO2 98%   BMI 28.19 kg/m  BMI: Body mass index is 28.19 kg/m.  A rech of his BP is 102/60 Well nourished, well developed, in no acute distress  HEENT: normocephalic, atraumatic  Neck: no JVD, carotid bruits or masses Cardiac:  RRR; no significant murmurs, no rubs, or gallops Lungs:  CTA b/l, no wheezing, rhonchi or rales  Abd: soft, nontender MS: no deformity or atrophy Ext: no edema  Skin: warm and dry, no rash Neuro:  No gross deficits appreciated Psych: euthymic mood, full affect  ICD site is stable, no tethering or discomfort   EKG:  Not done today  ICD interrogation done today and reviewed by myself:  Battery and lead measurements are good One NSVT bak in July 2020   11/14/2016: TTE Study Conclusions - Left ventricle: The cavity size was normal. Wall thickness was   increased in a pattern of mild LVH. Systolic function was mildly   reduced. The estimated ejection fraction was in the range of 45%   to 50%. Incoordinate septal motion. Doppler parameters are   consistent with abnormal left ventricular relaxation (grade 1   diastolic dysfunction). The E/e&' ratio is between 8-15,   suggesting indeterminate LV filling pressure. - Left atrium: The atrium was normal in size. - Right ventricle: The cavity size was normal. Wall thickness was   normal. Pacer wire or catheter noted in right ventricle. Systolic   function was normal. - Right atrium: The atrium was mildly dilated. Pacer wire or   catheter noted in right atrium. - Atrial septum: No defect or patent foramen ovale was identified. - Tricuspid valve: There was mild regurgitation. - Pulmonary arteries: PA peak pressure: 27 mm Hg (S). - Inferior vena cava: The vessel was normal in size. The   respirophasic diameter changes were in the normal range (>= 50%),    consistent with normal central venous pressure.  Impressions: - Compared to a previous study in 2014, there has been no change.   Recent Labs: 06/16/2019: TSH 0.63  No results found for requested labs within last 8760 hours.   CrCl cannot be calculated (Patient's most recent lab result is older than the maximum 21 days allowed.).   Wt Readings from Last 3 Encounters:  10/05/19 180 lb (81.6 kg)  08/25/19 195 lb (88.5 kg)  08/17/19 200 lb (90.7 kg)     Other studies reviewed: Additional studies/records reviewed today include: summarized above  ASSESSMENT AND PLAN:  1. CRT-D     Intact function, no programing  changes made  2. NICM 3. Chronic CHF     Last LVEF in 2019, 45-50%     No symptoms or exam findings of volume OL     OptiVol is below threshold, trending up, does not track with his weight, exam     On BB/ACE, diuretics     BMET today      4. HLD     Not addressed today  Disposition: F/u with remotes Q 3 months, and in-clinic with EP in 1 year,. Sooner if needed.  Current medicines are reviewed at length with the patient today.  The patient did not have any concerns regarding medicines.  Venetia Night, PA-C 10/05/2019 11:44 AM     [facility] [place] [address] [phone] (office)  [phone] (fax)

## 2019-10-05 ENCOUNTER — Ambulatory Visit (INDEPENDENT_AMBULATORY_CARE_PROVIDER_SITE_OTHER): Payer: Managed Care, Other (non HMO) | Admitting: Physician Assistant

## 2019-10-05 ENCOUNTER — Other Ambulatory Visit: Payer: Self-pay

## 2019-10-05 VITALS — BP 96/62 | HR 86 | Ht 67.0 in | Wt 180.0 lb

## 2019-10-05 DIAGNOSIS — I5022 Chronic systolic (congestive) heart failure: Secondary | ICD-10-CM

## 2019-10-05 DIAGNOSIS — Z95810 Presence of automatic (implantable) cardiac defibrillator: Secondary | ICD-10-CM | POA: Diagnosis not present

## 2019-10-05 DIAGNOSIS — I428 Other cardiomyopathies: Secondary | ICD-10-CM

## 2019-10-05 LAB — BASIC METABOLIC PANEL
BUN/Creatinine Ratio: 12 (ref 10–24)
BUN: 9 mg/dL (ref 8–27)
CO2: 23 mmol/L (ref 20–29)
Calcium: 9.5 mg/dL (ref 8.6–10.2)
Chloride: 97 mmol/L (ref 96–106)
Creatinine, Ser: 0.73 mg/dL — ABNORMAL LOW (ref 0.76–1.27)
GFR calc Af Amer: 114 mL/min/{1.73_m2} (ref >59)
GFR calc non Af Amer: 99 mL/min/{1.73_m2} (ref >59)
Glucose: 99 mg/dL (ref 65–99)
Potassium: 4.1 mmol/L (ref 3.5–5.2)
Sodium: 136 mmol/L (ref 134–144)

## 2019-10-05 NOTE — Patient Instructions (Signed)
Medication Instructions:   Your physician recommends that you continue on your current medications as directed. Please refer to the Current Medication list given to you today.  *If you need a refill on your cardiac medications before your next appointment, please call your pharmacy*  Lab Work:  BMET TODAY    If you have labs (blood work) drawn today and your tests are completely normal, you will receive your results only by: [name] MyChart Message (if you have MyChart) OR . A paper copy in the mail If you have any lab test that is abnormal or we need to change your treatment, we will call you to review the results.  Testing/Procedures: NONE ORDERED  TODAY   Follow-Up: At [facility], you and your health needs are our priority.  As part of our continuing mission to provide you with exceptional heart care, we have created designated Provider Care Teams.  These Care Teams include your primary Cardiologist (physician) and Advanced Practice Providers (APPs -  Physician Assistants and Nurse Practitioners) who all work together to provide you with the care you need, when you need it.  Your next appointment:   1 year(s)  The format for your next appointment:   In Person  Provider:   You may see Caryl Comes or one of the following Advanced Practice Providers on your designated Care Team:    Chanetta Marshall, NP  Tommye Standard, PA-C  Legrand Como "Jonni Sanger" [place], [place]   Other Instructions:

## 2019-10-17 ENCOUNTER — Other Ambulatory Visit: Payer: Self-pay | Admitting: Cardiology

## 2019-10-18 ENCOUNTER — Ambulatory Visit: Payer: Medicare Other | Admitting: Cardiology

## 2019-11-21 ENCOUNTER — Ambulatory Visit (INDEPENDENT_AMBULATORY_CARE_PROVIDER_SITE_OTHER): Payer: Managed Care, Other (non HMO) | Admitting: *Deleted

## 2019-11-21 DIAGNOSIS — I5022 Chronic systolic (congestive) heart failure: Secondary | ICD-10-CM

## 2019-11-21 LAB — CUP PACEART REMOTE DEVICE CHECK
Battery Remaining Longevity: 36 mo
Battery Voltage: 2.96 V
Brady Statistic AP VP Percent: 72.84 %
Brady Statistic AP VS Percent: 0.05 %
Brady Statistic AS VP Percent: 27.11 %
Brady Statistic AS VS Percent: 0 %
Brady Statistic RA Percent Paced: 72.88 %
Brady Statistic RV Percent Paced: 99.94 %
Date Time Interrogation Session: 20210315012506
HighPow Impedance: 49 Ohm
HighPow Impedance: 75 Ohm
Implantable Lead Implant Date: 20050120
Implantable Lead Implant Date: 20050120
Implantable Lead Implant Date: 20100826
Implantable Lead Location: 753858
Implantable Lead Location: 753859
Implantable Lead Location: 753860
Implantable Lead Model: 4194
Implantable Lead Model: 5076
Implantable Lead Model: 5076
Implantable Pulse Generator Implant Date: 20170106
Lead Channel Impedance Value: 285 Ohm
Lead Channel Impedance Value: 361 Ohm
Lead Channel Impedance Value: 475 Ohm
Lead Channel Impedance Value: 589 Ohm
Lead Channel Impedance Value: 608 Ohm
Lead Channel Impedance Value: 817 Ohm
Lead Channel Pacing Threshold Amplitude: 0.5 V
Lead Channel Pacing Threshold Amplitude: 0.75 V
Lead Channel Pacing Threshold Amplitude: 1 V
Lead Channel Pacing Threshold Pulse Width: 0.4 ms
Lead Channel Pacing Threshold Pulse Width: 0.4 ms
Lead Channel Pacing Threshold Pulse Width: 0.4 ms
Lead Channel Sensing Intrinsic Amplitude: 2.5 mV
Lead Channel Sensing Intrinsic Amplitude: 2.5 mV
Lead Channel Sensing Intrinsic Amplitude: 7.5 mV
Lead Channel Sensing Intrinsic Amplitude: 7.5 mV
Lead Channel Setting Pacing Amplitude: 1.5 V
Lead Channel Setting Pacing Amplitude: 2 V
Lead Channel Setting Pacing Amplitude: 2 V
Lead Channel Setting Pacing Pulse Width: 0.4 ms
Lead Channel Setting Pacing Pulse Width: 0.4 ms
Lead Channel Setting Sensing Sensitivity: 0.45 mV

## 2019-11-22 NOTE — Progress Notes (Signed)
ICD Remote  

## 2019-11-29 ENCOUNTER — Telehealth: Payer: Self-pay | Admitting: Cardiology

## 2019-11-29 NOTE — Telephone Encounter (Signed)
Agree. Continue meds and monitor BP  Davian Hanshaw Martinique MD, [facility]

## 2019-11-29 NOTE — Telephone Encounter (Signed)
91/49 prior to meds, unsure of heart rate. Took all carvedilol 25 mg.  Takes spironolactone at hs.  Recheck of vs on phone: 114/54 68.  No light headeness or dizziness this am.  He had 65 pound weight loss since September.  He tries to stay hydrated but water "sits in my stomach"  Other symptoms:  had covid vaccine #1 last Tuesday, yesterday afternoon got headache and low grade fever--adv unlikely this is from vaccine 6 days later and to monitor those symptoms and for any others.   Adv to check BP daily approx 3-4 hrs after taking meds and more freq if any symptoms. For today, adv to check BP little later this am and if still 114/ or higher to go ahead and take lisinopril.  Pt appreciative for information provided.   Aware I am sending message to Dr. Martinique and his nurse and if there are new recommendations he will receive a call back.

## 2019-11-29 NOTE — Telephone Encounter (Signed)
Spoke to patient Dr.Jordan's advice given.Stated he feels ok at present.Follow up appointment scheduled with Dr.Jordan 6/4 at 11:40 am.Advised to call sooner if needed.

## 2019-11-29 NOTE — Telephone Encounter (Signed)
   Pt c/o medication issue:  1. Name of Medication: lisinopril (PRINIVIL,ZESTRIL) 5 MG tablet  2. How are you currently taking this medication (dosage and times per day)? As directed  3. Are you having a reaction (difficulty breathing--STAT)? Low bp  4. What is your medication issue? Pt wanted to know if he could stop taking this medication. He took his BP this morning and it was  91/49 and that seemed low to him.   Pt also mentioned he got his first COVID shot last week and wondered if that could be affecting his BP

## 2019-12-12 ENCOUNTER — Encounter: Payer: Self-pay | Admitting: Gastroenterology

## 2019-12-19 ENCOUNTER — Other Ambulatory Visit: Payer: Self-pay | Admitting: Cardiology

## 2019-12-19 NOTE — Telephone Encounter (Signed)
Rx request sent to pharmacy.  

## 2020-01-02 ENCOUNTER — Ambulatory Visit (INDEPENDENT_AMBULATORY_CARE_PROVIDER_SITE_OTHER): Payer: Managed Care, Other (non HMO) | Admitting: Allergy

## 2020-01-02 ENCOUNTER — Encounter: Payer: Self-pay | Admitting: Allergy

## 2020-01-02 ENCOUNTER — Other Ambulatory Visit: Payer: Self-pay

## 2020-01-02 VITALS — BP 102/58 | HR 65 | Temp 97.8°F | Resp 16

## 2020-01-02 DIAGNOSIS — T781XXD Other adverse food reactions, not elsewhere classified, subsequent encounter: Secondary | ICD-10-CM | POA: Diagnosis not present

## 2020-01-02 DIAGNOSIS — J3089 Other allergic rhinitis: Secondary | ICD-10-CM | POA: Diagnosis not present

## 2020-01-02 DIAGNOSIS — K5909 Other constipation: Secondary | ICD-10-CM | POA: Diagnosis not present

## 2020-01-02 NOTE — Patient Instructions (Addendum)
Today's skin testing showed: Negative to shellfish and peanuts. Borderline positive to tree nuts.    Continue to avoid shellfish, peanuts, tree nuts for now. Get bloodwork:  We are ordering labs, so please allow 1-2 weeks for the results to come back. With the newly implemented Cures Act, the labs might be visible to you at the same time that they become visible to me. However, I will not address the results until all of the results are back, so please be patient.  In the meantime, continue recommendations in your patient instructions, including avoidance measures (if applicable), until you hear from me.  Depending on the bloodwork results, we may do in office food challenges. You must be off antihistamines for 3-5 days before. Must be in good health and not ill. Plan on being in the office for 2-3 hours and must bring in the food you want to do the oral challenge for. You must call to schedule an appointment and specify it's for a food challenge.    For mild symptoms you can take over the counter antihistamines such as Benadryl and monitor symptoms closely. If symptoms worsen or if you have severe symptoms including breathing issues, throat closure, significant swelling, whole body hives, severe diarrhea and vomiting, lightheadedness then seek immediate medical care.  Environmental allergies:  May use over the counter antihistamines such as Zyrtec (cetirizine), Claritin (loratadine), Allegra (fexofenadine), or Xyzal (levocetirizine) daily as needed.  Follow your GI doctors recommendation for the constipation.   Follow up for food challenge depending on bloodwork results.  Reducing Pollen Exposure . Pollen seasons: trees (spring), grass (summer) and ragweed/weeds (fall). [name] Keep windows closed in your home and car to lower pollen exposure.  Susa Simmonds air conditioning in the bedroom and throughout the house if possible.  . Avoid going out in dry windy days - especially early  morning. . Pollen counts are highest between 5 - 10 AM and on dry, hot and windy days.  . Save outside activities for late afternoon or after a heavy rain, when pollen levels are lower.  . Avoid mowing of grass if you have grass pollen allergy. [name] Be aware that pollen can also be transported indoors on people and pets.  . Dry your clothes in an automatic dryer rather than hanging them outside where they might collect pollen.  . Rinse hair and eyes before bedtime.

## 2020-01-02 NOTE — Assessment & Plan Note (Signed)
Past history - History of vomiting after shellfish ingestion and currently avoiding shellfish, peanuts and tree nuts in diet completely. He is not sure what happened with the peanuts and tree nuts. Interim history - No accidental ingestions but patient interested in re-introducing peanuts and tree nuts back into his diet.   Today's skin testing showed: Negative to shellfish and peanuts. Borderline positive to tree nuts.   Continue to avoid shellfish, peanuts, tree nuts for now. Food allergen skin testing has excellent negative predictive value however there is still a 5% chance that the allergy exists. Therefore, we will investigate further with serum specific IgE levels and, if negative then schedule for open graded oral food challenge. Get bloodwork for shellfish, peanuts and tree nut panel.   Depending on the bloodwork results, we may do in office food challenges. You must be off antihistamines for 3-5 days before. Must be in good health and not ill. Plan on being in the office for 2-3 hours and must bring in the food you want to do the oral challenge for. You must call to schedule an appointment and specify it's for a food challenge.

## 2020-01-02 NOTE — Progress Notes (Signed)
Follow Up Note  RE: Jonathon Avila MRN: [id number] DOB: 1956-05-19 Date of Office Visit: 01/02/2020  Referring provider: Chesley Noon, MD Primary care provider: Chesley Noon, MD  Chief Complaint: Allergy Testing (nuts)  History of Present Illness: I had the pleasure of seeing Jonathon Avila for a follow up visit at the [facility] on 01/02/2020. He is a 64 y.o. male, who is being followed for adverse food reaction and allergic rhinoconjunctivitis. His previous allergy office visit was on 07/25/2019 with Dr. Maudie Avila. Today is a regular follow up visit and skin testing.  Constipation Unchanged and is followed by GI.  Adverse food reaction Currently avoiding shellfish, peanuts and tree nuts. Patient is interested in reintroducing nuts back into his diet for increase protein intake.  Denies any reactions since the last visit.  Other allergic rhinitis Some increased rhinorrhea symptoms with the tree pollen. Take Claritin as needed with good benefit.  Assessment and Plan: Jonathon Avila is a 64 y.o. male with: Adverse food reaction Past history - History of vomiting after shellfish ingestion and currently avoiding shellfish, peanuts and tree nuts in diet completely. He is not sure what happened with the peanuts and tree nuts. Interim history - No accidental ingestions but patient interested in re-introducing peanuts and tree nuts back into his diet.   Today's skin testing showed: Negative to shellfish and peanuts. Borderline positive to tree nuts.   Continue to avoid shellfish, peanuts, tree nuts for now. Food allergen skin testing has excellent negative predictive value however there is still a 5% chance that the allergy exists. Therefore, we will investigate further with serum specific IgE levels and, if negative then schedule for open graded oral food challenge. Get bloodwork for shellfish, peanuts and tree nut panel.   Depending on the bloodwork results,  we may do in office food challenges. You must be off antihistamines for 3-5 days before. Must be in good health and not ill. Plan on being in the office for 2-3 hours and must bring in the food you want to do the oral challenge for. You must call to schedule an appointment and specify it's for a food challenge.   Other allergic rhinitis Past history - Mild rhino conjunctivitis symptoms during the spring and fall. Takes Claritin, Flonase and Singulair with good benefit. Last skin testing in 2014 which was positive to multiple items per patient report - records not available for review.  Interim history - Doing well with taking Claritin as needed. Noted some rhinorrhea with tree pollen season. Not using Flonase.   May use over the counter antihistamines such as Zyrtec (cetirizine), Claritin (loratadine), Allegra (fexofenadine), or Xyzal (levocetirizine) daily as needed.  Continue environmental control measures.   Other constipation Unchanged.   Follow up with GI regarding recommendation for the constipation.   Return for Food challenge.  Lab Orders     Allergen Profile, Shellfish     IgE Nut Prof. w/Component Rflx  Diagnostics: Skin Testing: Select foods. Positive test to: borderline to tree nuts. Negative test to: peanut and shellfish.  Results discussed with patient/family. Food Adult Perc - 01/02/20 0900    Time Antigen Placed  1005    Allergen Manufacturer  Jonathon Avila    Location  Back    Number of allergen test  17     Control-buffer 50% Glycerol  Negative    Control-Histamine 1 mg/ml  2+    1. Peanut  Negative    8. Shellfish Mix  Negative    10. Cashew  --   +/-   11. Pecan Food  --   +/-   Jonathon Avila  --   +/-   13. Almond  --   +/-   14. Hazelnut  --   +/-   15. [place] nut  Negative    16. Coconut  Negative    17. Pistachio  --   +/-   25. Shrimp  Negative    26. Crab  Negative    27. Lobster  Negative    28. Oyster  Negative    29. Scallops  Negative        Medication List:  Current Outpatient Medications  Medication Sig Dispense Refill  . aspirin EC 81 MG tablet Take 1 tablet (81 mg total) by mouth daily. 90 tablet 3  . carvedilol (COREG) 25 MG tablet TAKE 1 TABLET BY MOUTH TWICE DAILY WITH A MEAL 180 tablet 2  . cholecalciferol (VITAMIN D) 1000 UNITS tablet Take 2,000 Units by mouth 2 (two) times daily.     [name] docusate sodium (COLACE) 100 MG capsule Take 1-2 capsules by mouth 2 (two) times daily as needed for constipation.    . fluticasone (FLONASE) 50 MCG/ACT nasal spray Place 2 sprays into the nose daily.    . furosemide (LASIX) 40 MG tablet TAKE ONE TABLET BY MOUTH AS NEEDED 30 tablet 1  . lisinopril (PRINIVIL,ZESTRIL) 5 MG tablet Take 5 mg by mouth daily.    [name] loratadine (CLARITIN) 10 MG tablet Take 1 tablet by mouth daily.    . montelukast (SINGULAIR) 10 MG tablet Take 10 mg by mouth at bedtime.    [name] Peppermint Oil (IBGARD) 90 MG CPCR Take by mouth.    . simvastatin (ZOCOR) 20 MG tablet Take 1 tablet by mouth daily.    [name] spironolactone (ALDACTONE) 25 MG tablet TAKE 1 TABLET BY MOUTH AT BEDTIME 90 tablet 0  . FLUoxetine (PROZAC) 10 MG tablet Take 10 mg by mouth as needed.     No current facility-administered medications for this visit.   Allergies: Allergies  Allergen Reactions  . Penicillins Anaphylaxis    Other reaction(s): Hypotension  . Codeine     Upset stomach   . Shellfish-Derived Products Nausea And Vomiting   I reviewed his past medical history, social history, family history, and environmental history and no significant changes have been reported from his previous visit.  Review of Systems  Constitutional: Negative for appetite change, chills, fever and unexpected weight change.  HENT: Positive for rhinorrhea. Negative for congestion.   Eyes: Negative for itching.  Respiratory: Negative for cough, chest tightness, shortness of breath and wheezing.   Cardiovascular: Negative for chest pain.  Gastrointestinal:  Positive for constipation. Negative for abdominal pain.  Genitourinary: Negative for difficulty urinating.  Skin: Negative for rash.  Neurological: Negative for headaches.   Objective: BP (!) 102/58 (BP Location: Left Arm, Patient Position: Sitting, Cuff Size: Normal)   Pulse 65   Temp 97.8 F (36.6 C) (Temporal)   Resp 16   SpO2 100%  There is no height or weight on file to calculate BMI. Physical Exam  Constitutional: He is oriented to person, place, and time. He appears well-developed and well-nourished.  HENT:  Head: Normocephalic and atraumatic.  Right Ear: External ear normal.  Left Ear: External ear normal.  Nose: Nose normal.  Mouth/Throat: Oropharynx is clear and moist.  Eyes: Conjunctivae and EOM are normal.  Cardiovascular: Normal rate, regular rhythm  and normal heart sounds. Exam reveals no gallop and no friction rub.  No murmur heard. Pulmonary/Chest: Effort normal and breath sounds normal. He has no wheezes. He has no rales.  Abdominal: Soft.  Musculoskeletal:     Cervical back: Neck supple.  Neurological: He is alert and oriented to person, place, and time.  Skin: Skin is warm. No rash noted.  Psychiatric: He has a normal mood and affect. His behavior is normal.  Nursing note and vitals reviewed.  Previous notes and tests were reviewed. The plan was reviewed with the patient/family, and all questions/concerned were addressed.  It was my pleasure to see Jonathon Avila today and participate in his care. Please feel free to contact me with any questions or concerns.  Sincerely,  Rexene Alberts, DO Allergy & Immunology  [facility] office: [phone] [facility] office: [place] office: [phone]

## 2020-01-02 NOTE — Assessment & Plan Note (Addendum)
Past history - Mild rhino conjunctivitis symptoms during the spring and fall. Takes Claritin, Flonase and Singulair with good benefit. Last skin testing in 2014 which was positive to multiple items per patient report - records not available for review.  Interim history - Doing well with taking Claritin as needed. Noted some rhinorrhea with tree pollen season. Not using Flonase.   May use over the counter antihistamines such as Zyrtec (cetirizine), Claritin (loratadine), Allegra (fexofenadine), or Xyzal (levocetirizine) daily as needed.  Continue environmental control measures.

## 2020-01-02 NOTE — Assessment & Plan Note (Signed)
Unchanged.   Follow up with GI regarding recommendation for the constipation.

## 2020-01-07 LAB — PANEL 604721
Jug R 1 IgE: <0.1 kU/L
Jug R 3 IgE: <0.1 kU/L

## 2020-01-07 LAB — IGE NUT PROF. W/COMPONENT RFLX
F017-IgE Hazelnut (Filbert): <0.1 kU/L
F018-IgE Brazil Nut: <0.1 kU/L
F020-IgE Almond: <0.1 kU/L
F202-IgE Cashew Nut: <0.1 kU/L
F203-IgE Pistachio Nut: <0.1 kU/L
F256-IgE Walnut: 0.21 kU/L — AB
Macadamia Nut, IgE: <0.1 kU/L
Peanut, IgE: <0.1 kU/L
Pecan Nut IgE: <0.1 kU/L

## 2020-01-07 LAB — ALLERGEN COMPONENT COMMENTS

## 2020-01-07 LAB — ALLERGEN PROFILE, SHELLFISH
Clam IgE: <0.1 kU/L
F023-IgE Crab: <0.1 kU/L
F080-IgE Lobster: <0.1 kU/L
F290-IgE Oyster: <0.1 kU/L
Scallop IgE: <0.1 kU/L
Shrimp IgE: <0.1 kU/L

## 2020-01-30 ENCOUNTER — Other Ambulatory Visit: Payer: Self-pay

## 2020-01-30 ENCOUNTER — Ambulatory Visit (AMBULATORY_SURGERY_CENTER): Payer: Self-pay | Admitting: *Deleted

## 2020-01-30 VITALS — Ht 67.0 in | Wt 163.0 lb

## 2020-01-30 DIAGNOSIS — Z86010 Personal history of colonic polyps: Secondary | ICD-10-CM

## 2020-01-30 MED ORDER — SUPREP BOWEL PREP KIT 17.5-3.13-1.6 GM/177ML PO SOLN: 1.0000 | Freq: Once | ORAL | 0 refills | Status: AC

## 2020-01-30 NOTE — Progress Notes (Signed)
12-13-2019 comp covid  Vaccines   No egg or soy allergy known to patient  No issues with past sedation with any surgeries  or procedures, no intubation problems  No diet pills per patient No home 02 use per patient  No blood thinners per patient  Pt denies issues with constipation - pt states goes 4-5 x a day- uses miralax PRN only and only uses 1/2 tsp PRN when he needs it to [place]  out the edges per pt  No A fib or A flutter  EMMI video sent to pt's e mail   Due to the COVID-19 pandemic we are asking patients to follow these guidelines. Please only bring one care partner. Please be aware that your care partner may wait in the car in the parking lot or if they feel like they will be too hot to wait in the car, they may wait in the lobby on the 4th floor. All care partners are required to wear a mask the entire time (we do not have any that we can provide them), they need to practice social distancing, and we will do a Covid check for all patient's and care partners when you arrive. Also we will check their temperature and your temperature. If the care partner waits in their car they need to stay in the parking lot the entire time and we will call them on their cell phone when the patient is ready for discharge so they can bring the car to the front of the building. Also all patient's will need to wear a mask into building.

## 2020-01-31 ENCOUNTER — Other Ambulatory Visit: Payer: Self-pay

## 2020-01-31 ENCOUNTER — Encounter: Payer: Managed Care, Other (non HMO) | Attending: Gastroenterology | Admitting: Dietician

## 2020-01-31 DIAGNOSIS — Z713 Dietary counseling and surveillance: Secondary | ICD-10-CM | POA: Diagnosis present

## 2020-01-31 DIAGNOSIS — R7303 Prediabetes: Secondary | ICD-10-CM | POA: Insufficient documentation

## 2020-01-31 DIAGNOSIS — I5022 Chronic systolic (congestive) heart failure: Secondary | ICD-10-CM | POA: Diagnosis not present

## 2020-01-31 DIAGNOSIS — K5909 Other constipation: Secondary | ICD-10-CM | POA: Insufficient documentation

## 2020-01-31 DIAGNOSIS — K581 Irritable bowel syndrome with constipation: Secondary | ICD-10-CM | POA: Diagnosis not present

## 2020-01-31 NOTE — Progress Notes (Signed)
Medical Nutrition Therapy:  Appt start time: [id number] end time:  1650.   Assessment:  Primary concerns today: Patient is here today alone.  He states that his diet has gotten a little better since last seen12/17/2020.    He has been following some of the Low FODMAP information to help identify foods that would cause IBS constipation.   Limits red meat, pasta and pizza as these increase constipation/slow transit.  He states he tolerates GF pasta better although does seem to tolerate bread but in small amounts.   He has been losing weight.  Aiming for about 20 grams of fiber daily.  He states that his wife and daughter think that he is too thin.  He wants to be sure that he is healthy and doing what he needs to stay healthy. He aims for 1500 calories per day.  He keeps track of his calories and generally eats 1500-1800 calories. He states that he feels better and is able to be more physically active since losing weight.  He loves to play golf.  A1C noted 06/24/2019 5.8% which meets criteria for prediabetes.   Most recent A1C 01/2020 was 4.7% which no longer meets criteria for prediabetes.  Weight hx: 164 lbs 01/31/2020 (weight loss of 30 lbs in the past 5 months) 195 lbs 08/25/2019 He has lost weight both purposeful as well as restrictions related to IBS 240 lbs 05/2019  Patient and wife live with their oldest daughter.  His daughter and wife do most of the shopping and cooking.   Walks 30 minutes per day when possible and golf several times per week.  Preferred Learning Style:   No preference indicated   Learning Readiness:   Ready  Change in progress   MEDICATIONS: see list   DIETARY INTAKE:  Everyday foods include chicken, fish, [place], red skin potatoes, carrots.  Avoided foods include soft drinks.   Does not tolerate white pasta well but tolerates GF better although tolerates small amounts of bread.  24-hr recall:  B ( AM): [employer] toast with jelly, mandarin orange, rare sausage   Snk ( AM): sun chips, grapes or strawberries L ( PM): fish, brown rice or potatoes, vegetables Snk ( PM):  D ( PM): chicken, green beans, brown rice, toast and jelly Snk ( PM): pretzels Beverages: hot tea, water, almond milk (45-50 oz fluid) when home and about 60 oz fluid since being more active and weather is warmer)  Usual physical activity: golf 1-3 times per week, gardening, walking  Estimated energy needs: 1800-2000 calories 75 g protein  Progress Towards Goal(s):  In progress.   Nutritional Diagnosis:  NB-1.1 Food and nutrition-related knowledge deficit As related to balance of carbohydrate, protein, and fat.  As evidenced by diet hx.    Intervention:  Nutrition education continued related to IBS with constipation.  Discussed that the Low Fodmap diet is only to be used temporarily and for him to add a new food every 2 days to determine tolerance as well as increase variety in his diet.  Discussed foods that can often help with constipation.  Discussed the need to add protein to breakfast. Discussed need for adequate fluid intake. Discussed importance of adequate caloric intake and to eat until satisfied.  Avoid losing additional weight.  Current weight is acceptable as you have increased your strength and energy level and feel well. Called patient and discussed increasing his calorie goals and continue to listen to his body.  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  Plan: Plums or prunes, dates, sweet potatoes, greens, beans can all help with constipation.  Be sure to have a protein with breakfast. Aim to increase the variety in your diet. Add one new food at a time.  Wait 2 days before adding a new foods.  This will help determine how well you tolerated this food.  Recommend increasing your fluid intake to 64 oz daily so long as this is not causing increased fluid retention. Continue a low sodium diet. Consider increasing  your fiber intake as tolerated. Eat until you are satisfied.  Avoid losing more weight.   Teaching Method Utilized:  Auditory  Handouts given during visit include:  High fiber foods from AND  Low FodMap diet from AND  Barriers to learning/adherence to lifestyle change: none  Demonstrated degree of understanding via:  Teach Back   Monitoring/Evaluation:  Dietary intake, exercise, and body weight prn.

## 2020-01-31 NOTE — Patient Instructions (Signed)
Plums or prunes, dates, sweet potatoes, greens, beans can all help with constipation.  Be sure to have a protein with breakfast. Aim to increase the variety in your diet. Add one new food at a time.  Wait 2 days before adding a new foods.  This will help determine how well you tolerated this food.  Recommend increasing your fluid intake to 64 oz daily so long as this is not causing increased fluid retention. Continue a low sodium diet. Consider increasing your fiber intake as tolerated. Eat until you are satisfied.  Avoid losing more weight.

## 2020-02-02 ENCOUNTER — Encounter: Payer: Self-pay | Admitting: Dietician

## 2020-02-04 NOTE — Progress Notes (Signed)
Jonathon Avila Date of Birth: [birth year]   History of Present Illness: Jonathon Avila is seen  today for followup of CHF. 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 He is status post biventricular ICD. This did result in significant improvement in LV function. Echo was last check in March 2018 and was unchanged from 2014 with EF 45-50%. He underwent a generator change out of his ICD on [id number] without complications. Last ICD check in 11/21/19 was satisfactory with normal Optivol and 99% BiV pacing.  On follow up today he is doing very well. He developed chronic constipation and completely changed his diet a year ago.  Has lost almost 90 lbs. States he feels much better. His nutritionist is encouraging increased calories now and he states weight has stabilized. Recent labs from [place] noted below. No dizziness. Energy is good. Playing golf at [place].   Current Outpatient Medications on File Prior to Visit  Medication Sig Dispense Refill  . aspirin EC 81 MG tablet Take 1 tablet (81 mg total) by mouth daily. 90 tablet 3  . cholecalciferol (VITAMIN D) 1000 UNITS tablet Take 2,000 Units by mouth 2 (two) times daily.     [name] docusate sodium (COLACE) 100 MG capsule Take 1-2 capsules by mouth 2 (two) times daily as needed for constipation.    . fluticasone (FLONASE) 50 MCG/ACT nasal spray Place 2 sprays into the nose daily.    . furosemide (LASIX) 40 MG tablet TAKE ONE TABLET BY MOUTH AS NEEDED 30 tablet 1  . lisinopril (PRINIVIL,ZESTRIL) 5 MG tablet Take 5 mg by mouth daily.    [name] loratadine (CLARITIN) 10 MG tablet Take 1 tablet by mouth daily.    . montelukast (SINGULAIR) 10 MG tablet Take 10 mg by mouth at bedtime.    [name] Peppermint Oil (IBGARD) 90 MG CPCR Take by mouth.    . polyethylene glycol (MIRALAX / GLYCOLAX) 17 g packet Take 17 g by mouth daily as needed. Pt uses 1/2 tsp as needed    . simvastatin (ZOCOR) 20 MG tablet Take 1 tablet by mouth daily.    [name]  spironolactone (ALDACTONE) 25 MG tablet TAKE 1 TABLET BY MOUTH AT BEDTIME 90 tablet 0   No current facility-administered medications on file prior to visit.    Allergies  Allergen Reactions  . Penicillins Anaphylaxis    Other reaction(s): Hypotension  . Codeine     Upset stomach   . Shellfish-Derived Products Nausea And Vomiting    Past Medical History:  Diagnosis Date  . [phone] lead    6949 rate sense portion was replaced with a Medtronic 5076-lead 2010   . Adenomatous polyps   . Allergy   . Chronic systolic CHF (congestive heart failure) ([place])   . Hyperlipidemia   . Hypertension    controlled on medications   . 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 LBBB (left bundle branch block)   . LV dysfunction   . Mitral insufficiency   . Nonischemic dilated cardiomyopathy ([place])   . Pulmonary hypertension ([place])     Past Surgical History:  Procedure Laterality Date  . CARDIAC CATHETERIZATION  10/25/2001   EF 65%  . CHOLECYSTECTOMY    . COLONOSCOPY    . EP IMPLANTABLE DEVICE N/A 09/14/2015   Procedure:  ICD Generator Changeout;  Surgeon: Deboraha Sprang, MD;  Location: [facility];  Service: Cardiovascular;  Laterality: N/A;  . ICD  BIVENTRICULAR  . POLYPECTOMY    . US ECHOCARDIOGRAPHY  02/19/2009   EF 45%  . wisdom teeth extraxtion      Social History   Tobacco Use  Smoking Status Never Smoker  Smokeless Tobacco Never Used    Social History   Substance and Sexual Activity  Alcohol Use No    Family History  Problem Relation Age of Onset  . COPD Mother        deceased  . Colon cancer Father        deceased age 24  . Prostate cancer Maternal Grandmother   . Esophageal cancer Neg Hx   . Pancreatic cancer Neg Hx   . Rectal cancer Neg Hx   . Stomach cancer Neg Hx   . Colon polyps Neg Hx     Review of Systems: As noted in history of present illness.  All other systems were reviewed and are  negative.  Physical Exam: BP 98/60   Pulse 80   Temp (!) 97 F (36.1 C)   Ht 5\' 7"  (1.702 m)   Wt 164 lb 9.6 oz (74.7 kg)   SpO2 99%   BMI 25.78 kg/m  GENERAL:  Well appearing WM in NAD HEENT:  PERRL, EOMI, sclera are clear. Oropharynx is clear. NECK:  No jugular venous distention, carotid upstroke brisk and symmetric, no bruits, no thyromegaly or adenopathy LUNGS:  Clear to auscultation bilaterally CHEST:  Unremarkable, ICD in left upper chest HEART:  RRR,  PMI not displaced or sustained,S1 and S2 within normal limits, no S3, no S4: no clicks, no rubs, no murmurs ABD:  Soft, nontender. BS +, no masses or bruits. No hepatomegaly, no splenomegaly EXT:  2 + pulses throughout, no edema, no cyanosis no clubbing SKIN:  Warm and dry.  No rashes NEURO:  Alert and oriented x 3. Cranial nerves II through XII intact. PSYCH:  Cognitively intact  LABORATORY DATA:  Lab Results  Component Value Date   WBC 11.2 (H) 09/24/2018   HGB 14.2 09/24/2018   HCT 41.3 09/24/2018   PLT 231 09/24/2018   GLUCOSE 99 10/05/2019   CHOL  10/21/2009    112        ATP III CLASSIFICATION:  <200     mg/dL   Desirable  200-239  mg/dL   Borderline High  >=240    mg/dL   High          TRIG 129 10/21/2009   HDL 34 (L) 10/21/2009   LDLCALC  10/21/2009    52        Total Cholesterol/HDL:CHD Risk Coronary Heart Disease Risk Table                     Men   Women  1/2 Average Risk   3.4   3.3  Average Risk       5.0   4.4  2 X Average Risk   9.6   7.1  3 X Average Risk  23.4   11.0        Use the calculated Patient Ratio above and the CHD Risk Table to determine the patient's CHD Risk.        ATP III CLASSIFICATION (LDL):  <100     mg/dL   Optimal  100-129  mg/dL   Near or Above                    Optimal  130-159  mg/dL   Borderline  160-189  mg/dL   High  >190     mg/dL   Very High   ALT 23 05/13/2015   AST 22 05/13/2015   NA 136 10/05/2019   K 4.1 10/05/2019   CL 97 10/05/2019   CREATININE  0.73 (L) 10/05/2019   BUN 9 10/05/2019   CO2 23 10/05/2019   TSH 0.63 06/16/2019   INR 1.1 ratio (H) 04/26/2009   HGBA1C 5.8 06/24/2019   Labs reviewed from primary care 04/03/16: CMET and CBC normal. Cholesterol 116, trig- 132, HDL 31, LDL 59. BNP 14.1. Jul 04, 2016: A1c 5.7%. Oct 07, 2016 A1c 5.6%.  Dated 04/15/17: glucose 115. A1c 5.6%. Otherwise CMET and CBC normal. Cholesterol 120, triglycerides 109, HDL 35, LDL 63.  Dated 10/05/17: glucose 152, otherwise CBC and CMET normal. Cholesterol 108, triglycerides 152, HDL 32, LDL 46 Dated 04/14/18: cholesterol 116, triglycerides 114, HDL 32, LDL 61. CMET normal.  Dated 06/10/19: normal CBC and CMET.  Dated 01/26/20: cholesterol 110, triglycerides 57, HDL 57, LDL 42.  CBC, CMET, Vit D normal.    Echo 11/14/16: Study Conclusions  - Left ventricle: The cavity size was normal. Wall thickness was   increased in a pattern of mild LVH. Systolic function was mildly   reduced. The estimated ejection fraction was in the range of 45%   to 50%. Incoordinate septal motion. Doppler parameters are   consistent with abnormal left ventricular relaxation (grade 1   diastolic dysfunction). The E/e&' ratio is between 8-15,   suggesting indeterminate LV filling pressure. - Left atrium: The atrium was normal in size. - Right ventricle: The cavity size was normal. Wall thickness was   normal. Pacer wire or catheter noted in right ventricle. Systolic   function was normal. - Right atrium: The atrium was mildly dilated. Pacer wire or   catheter noted in right atrium. - Atrial septum: No defect or patent foramen ovale was identified. - Tricuspid valve: There was mild regurgitation. - Pulmonary arteries: PA peak pressure: 27 mm Hg (S). - Inferior vena cava: The vessel was normal in size. The   respirophasic diameter changes were in the normal range (>= 50%),   consistent with normal central venous pressure.  Impressions:  - Compared to a previous study in 2014,  there has been no change.  Assessment / Plan: 1. Chronic systolic congestive heart failure. Last Ejection fraction of 45-50% in March 2018- unchanged from 2014. He has class 1 symptoms. He is on optimal medical therapy with an ACE inhibitor, carvedilol, and Aldactone.  He has a biventricular pacemaker in place. I am concerned his BP now is too low since he has lost all this weight. Will reduce Coreg to 12.5 mg bid.   2. Status post ICD/biventricular pacemaker. Continue followup in EP clinic. S/p generator change out Jan 2017. ICD check in September was good.    3. Obesity.he has lost almost 90 lbs. At ideal weight. Encourage continued dietary modification.  4. Left bundle branch block.  Follow up in 6 months

## 2020-02-10 ENCOUNTER — Encounter: Payer: Self-pay | Admitting: Cardiology

## 2020-02-10 ENCOUNTER — Other Ambulatory Visit: Payer: Self-pay

## 2020-02-10 ENCOUNTER — Ambulatory Visit (INDEPENDENT_AMBULATORY_CARE_PROVIDER_SITE_OTHER): Payer: Managed Care, Other (non HMO) | Admitting: Cardiology

## 2020-02-10 VITALS — BP 98/60 | HR 80 | Temp 97.0°F | Ht 67.0 in | Wt 164.6 lb

## 2020-02-10 DIAGNOSIS — I428 Other cardiomyopathies: Secondary | ICD-10-CM | POA: Diagnosis not present

## 2020-02-10 DIAGNOSIS — Z95810 Presence of automatic (implantable) cardiac defibrillator: Secondary | ICD-10-CM

## 2020-02-10 DIAGNOSIS — I5022 Chronic systolic (congestive) heart failure: Secondary | ICD-10-CM

## 2020-02-10 DIAGNOSIS — E7800 Pure hypercholesterolemia, unspecified: Secondary | ICD-10-CM | POA: Diagnosis not present

## 2020-02-10 DIAGNOSIS — I447 Left bundle-branch block, unspecified: Secondary | ICD-10-CM

## 2020-02-10 MED ORDER — CARVEDILOL 25 MG PO TABS: 12.5000 mg | ORAL_TABLET | Freq: Two times a day (BID) | ORAL | 3 refills | Status: DC

## 2020-02-10 NOTE — Patient Instructions (Signed)
Reduce Coreg to 12.5 mg twice a day  Continue your other therapy

## 2020-02-12 ENCOUNTER — Telehealth: Payer: Self-pay | Admitting: Allergy

## 2020-02-12 NOTE — Telephone Encounter (Signed)
Please call patient to confirm food challenge for peanuts next week on 02/22/2020.  Must be off antihistamines for 3-5 days before. Must be in good health and not ill. No vaccines/injections within the past 7 days. Not on any antibiotics.   Plan on being in the office for 2-3 hours and must bring in the food you want to do the oral challenge for - creamy peanut butter.

## 2020-02-13 ENCOUNTER — Encounter: Payer: Managed Care, Other (non HMO) | Admitting: Gastroenterology

## 2020-02-13 NOTE — Telephone Encounter (Signed)
Spoke with patient to confirm oral challenge and patient stated he believes he doesn't need to do this and states he does fine eating chocolates with nuts in them without any issues.

## 2020-02-20 ENCOUNTER — Ambulatory Visit (INDEPENDENT_AMBULATORY_CARE_PROVIDER_SITE_OTHER): Payer: Managed Care, Other (non HMO) | Admitting: *Deleted

## 2020-02-20 DIAGNOSIS — I5022 Chronic systolic (congestive) heart failure: Secondary | ICD-10-CM

## 2020-02-20 DIAGNOSIS — I428 Other cardiomyopathies: Secondary | ICD-10-CM | POA: Diagnosis not present

## 2020-02-20 LAB — CUP PACEART REMOTE DEVICE CHECK
Battery Remaining Longevity: 32 mo
Battery Voltage: 2.95 V
Brady Statistic AP VP Percent: 72.17 %
Brady Statistic AP VS Percent: 0.04 %
Brady Statistic AS VP Percent: 27.79 %
Brady Statistic AS VS Percent: 0 %
Brady Statistic RA Percent Paced: 72.2 %
Brady Statistic RV Percent Paced: 99.95 %
Date Time Interrogation Session: 20210614033326
HighPow Impedance: 49 Ohm
HighPow Impedance: 72 Ohm
Implantable Lead Implant Date: 20050120
Implantable Lead Implant Date: 20050120
Implantable Lead Implant Date: 20100826
Implantable Lead Location: 753858
Implantable Lead Location: 753859
Implantable Lead Location: 753860
Implantable Lead Model: 4194
Implantable Lead Model: 5076
Implantable Lead Model: 5076
Implantable Pulse Generator Implant Date: 20170106
Lead Channel Impedance Value: 304 Ohm
Lead Channel Impedance Value: 361 Ohm
Lead Channel Impedance Value: 475 Ohm
Lead Channel Impedance Value: 551 Ohm
Lead Channel Impedance Value: 589 Ohm
Lead Channel Impedance Value: 722 Ohm
Lead Channel Pacing Threshold Amplitude: 0.5 V
Lead Channel Pacing Threshold Amplitude: 0.75 V
Lead Channel Pacing Threshold Amplitude: 0.875 V
Lead Channel Pacing Threshold Pulse Width: 0.4 ms
Lead Channel Pacing Threshold Pulse Width: 0.4 ms
Lead Channel Pacing Threshold Pulse Width: 0.4 ms
Lead Channel Sensing Intrinsic Amplitude: 2.625 mV
Lead Channel Sensing Intrinsic Amplitude: 2.625 mV
Lead Channel Sensing Intrinsic Amplitude: 8 mV
Lead Channel Sensing Intrinsic Amplitude: 8 mV
Lead Channel Setting Pacing Amplitude: 1.5 V
Lead Channel Setting Pacing Amplitude: 2 V
Lead Channel Setting Pacing Amplitude: 2 V
Lead Channel Setting Pacing Pulse Width: 0.4 ms
Lead Channel Setting Pacing Pulse Width: 0.4 ms
Lead Channel Setting Sensing Sensitivity: 0.45 mV

## 2020-02-21 NOTE — Progress Notes (Signed)
Remote ICD transmission.   

## 2020-02-22 ENCOUNTER — Encounter: Payer: Medicare Other | Admitting: Allergy

## 2020-03-12 ENCOUNTER — Other Ambulatory Visit: Payer: Self-pay | Admitting: Cardiology

## 2020-04-29 ENCOUNTER — Telehealth: Payer: Self-pay | Admitting: Gastroenterology

## 2020-04-29 NOTE — Telephone Encounter (Signed)
Called for constipation. I have advised him to increase MiraLAX to 2 teaspoons/day and let us know how he is in the next 4 to 5 days.   He did previsit for colonoscopy (d/t H/O polyps and FH) but then decided to hold off Willing to get colonoscopy done before the end of the year  Beth, Please set him up for colonoscopy with Dr. Silverio Decamp (routine), before the end of the year RG

## 2020-04-30 NOTE — Telephone Encounter (Signed)
Very nice patient! Spoke with him about scheduling. He said "that would be rather ambitious" "not ready to schedule yet" and must prepare himself "mentally to do this." He indicates the plan he has in place is to prepare himself mentally and get the colonoscopy done by the end of the year. He states he will call us when he is ready for scheduling.

## 2020-05-01 ENCOUNTER — Telehealth: Payer: Self-pay | Admitting: Cardiology

## 2020-05-01 NOTE — Telephone Encounter (Signed)
Spoke to patient he stated he noticed his B/P was up one time last week 159/80.Stated this morning B/P 115/62.Stated he took shingles vaccine last week and had to take Ibuprofen for a couple of days due to sore arm.Stated his anxiety has increased.Stated he has been eating more and has gained 10 lbs since last visit. He wanted to know if he needed to increase Coreg. Advised to eat a better diet.Check B/P daily and call back in 1 week to report readings.I will make Dr.Jordan aware.

## 2020-05-01 NOTE — Telephone Encounter (Signed)
Pt c/o BP issue: STAT if pt c/o blurred vision, one-sided weakness or slurred speech  1. What are your last 5 BP readings? 159/80  2. Are you having any other symptoms (ex. Dizziness, headache, blurred vision, passed out)? Headache  3. What is your BP issue? Patient states he had a headache this morning and states he cut a 500 mg tylenol in half. He states he his BP is now high would like to know if the tylenol could cause the high BP

## 2020-05-01 NOTE — Telephone Encounter (Signed)
Ok, thank you

## 2020-05-07 ENCOUNTER — Telehealth: Payer: Self-pay

## 2020-05-07 NOTE — Telephone Encounter (Signed)
Spoke with pt. Explained that abnormal BP will not show up on ICD data. Reassured him of normal CRT-D function, no alerts. 1 NSVT, 6 beats. OptiVol suggests possible fluid accumulation since 04/28/20. Pt denies symptoms, recently returned home from a [place] retreat so he may have been eating more sodium than usual. Reports he will start weighing daily, has PRN furosemide if needed. Advised will plan to reassess OptiVol on next scheduled transmission on 05/21/20. If abnormal, will plan to call again. Pt in agreement with plan and verbalizes appreciation of call. Pt denies additional questions or concerns at this time.

## 2020-05-07 NOTE — Telephone Encounter (Signed)
I told the pt the nurse is going to review your transmission and call him back. Transmission reviewed.

## 2020-05-07 NOTE — Telephone Encounter (Signed)
Patient called in stating that he thinks something might be wrong with his device he says that his bp has been high and he wants to know if something is wrong with one of his leads. Patient says he will send in a transmission and I let him know a nurse will call him once we receive it.

## 2020-05-21 ENCOUNTER — Telehealth: Payer: Self-pay | Admitting: Cardiology

## 2020-05-21 ENCOUNTER — Ambulatory Visit (INDEPENDENT_AMBULATORY_CARE_PROVIDER_SITE_OTHER): Payer: Managed Care, Other (non HMO) | Admitting: *Deleted

## 2020-05-21 DIAGNOSIS — I428 Other cardiomyopathies: Secondary | ICD-10-CM | POA: Diagnosis not present

## 2020-05-21 LAB — CUP PACEART REMOTE DEVICE CHECK
Battery Remaining Longevity: 28 mo
Battery Voltage: 2.95 V
Brady Statistic AP VP Percent: 68.57 %
Brady Statistic AP VS Percent: 0.04 %
Brady Statistic AS VP Percent: 31.39 %
Brady Statistic AS VS Percent: 0.01 %
Brady Statistic RA Percent Paced: 68.6 %
Brady Statistic RV Percent Paced: 99.94 %
Date Time Interrogation Session: 20210913073724
HighPow Impedance: 50 Ohm
HighPow Impedance: 75 Ohm
Implantable Lead Implant Date: 20050120
Implantable Lead Implant Date: 20050120
Implantable Lead Implant Date: 20100826
Implantable Lead Location: 753858
Implantable Lead Location: 753859
Implantable Lead Location: 753860
Implantable Lead Model: 4194
Implantable Lead Model: 5076
Implantable Lead Model: 5076
Implantable Pulse Generator Implant Date: 20170106
Lead Channel Impedance Value: 285 Ohm
Lead Channel Impedance Value: 418 Ohm
Lead Channel Impedance Value: 456 Ohm
Lead Channel Impedance Value: 532 Ohm
Lead Channel Impedance Value: 589 Ohm
Lead Channel Impedance Value: 703 Ohm
Lead Channel Pacing Threshold Amplitude: 0.5 V
Lead Channel Pacing Threshold Amplitude: 0.75 V
Lead Channel Pacing Threshold Amplitude: 0.75 V
Lead Channel Pacing Threshold Pulse Width: 0.4 ms
Lead Channel Pacing Threshold Pulse Width: 0.4 ms
Lead Channel Pacing Threshold Pulse Width: 0.4 ms
Lead Channel Sensing Intrinsic Amplitude: 2.125 mV
Lead Channel Sensing Intrinsic Amplitude: 2.125 mV
Lead Channel Sensing Intrinsic Amplitude: 8 mV
Lead Channel Sensing Intrinsic Amplitude: 8 mV
Lead Channel Setting Pacing Amplitude: 1.5 V
Lead Channel Setting Pacing Amplitude: 1.75 V
Lead Channel Setting Pacing Amplitude: 2 V
Lead Channel Setting Pacing Pulse Width: 0.4 ms
Lead Channel Setting Pacing Pulse Width: 0.4 ms
Lead Channel Setting Sensing Sensitivity: 0.45 mV

## 2020-05-23 NOTE — Progress Notes (Signed)
Remote ICD transmission.   

## 2020-06-11 ENCOUNTER — Other Ambulatory Visit: Payer: Self-pay | Admitting: Cardiology

## 2020-06-13 DIAGNOSIS — Z79899 Other long term (current) drug therapy: Secondary | ICD-10-CM

## 2020-07-06 NOTE — Telephone Encounter (Signed)
error 

## 2020-08-20 ENCOUNTER — Ambulatory Visit (INDEPENDENT_AMBULATORY_CARE_PROVIDER_SITE_OTHER): Payer: Managed Care, Other (non HMO)

## 2020-08-20 DIAGNOSIS — I428 Other cardiomyopathies: Secondary | ICD-10-CM

## 2020-08-20 DIAGNOSIS — I5022 Chronic systolic (congestive) heart failure: Secondary | ICD-10-CM

## 2020-08-20 LAB — CUP PACEART REMOTE DEVICE CHECK
Battery Remaining Longevity: 29 mo
Battery Voltage: 2.95 V
Brady Statistic AP VP Percent: 56.67 %
Brady Statistic AP VS Percent: 0.04 %
Brady Statistic AS VP Percent: 43.29 %
Brady Statistic AS VS Percent: 0.01 %
Brady Statistic RA Percent Paced: 56.69 %
Brady Statistic RV Percent Paced: 99.91 %
Date Time Interrogation Session: 20211213022605
HighPow Impedance: 52 Ohm
HighPow Impedance: 77 Ohm
Implantable Lead Implant Date: 20050120
Implantable Lead Implant Date: 20050120
Implantable Lead Implant Date: 20100826
Implantable Lead Location: 753858
Implantable Lead Location: 753859
Implantable Lead Location: 753860
Implantable Lead Model: 4194
Implantable Lead Model: 5076
Implantable Lead Model: 5076
Implantable Pulse Generator Implant Date: 20170106
Lead Channel Impedance Value: 285 Ohm
Lead Channel Impedance Value: 399 Ohm
Lead Channel Impedance Value: 475 Ohm
Lead Channel Impedance Value: 551 Ohm
Lead Channel Impedance Value: 608 Ohm
Lead Channel Impedance Value: 722 Ohm
Lead Channel Pacing Threshold Amplitude: 0.5 V
Lead Channel Pacing Threshold Amplitude: 0.75 V
Lead Channel Pacing Threshold Amplitude: 0.75 V
Lead Channel Pacing Threshold Pulse Width: 0.4 ms
Lead Channel Pacing Threshold Pulse Width: 0.4 ms
Lead Channel Pacing Threshold Pulse Width: 0.4 ms
Lead Channel Sensing Intrinsic Amplitude: 3.25 mV
Lead Channel Sensing Intrinsic Amplitude: 3.25 mV
Lead Channel Sensing Intrinsic Amplitude: 9.25 mV
Lead Channel Sensing Intrinsic Amplitude: 9.25 mV
Lead Channel Setting Pacing Amplitude: 1.5 V
Lead Channel Setting Pacing Amplitude: 1.75 V
Lead Channel Setting Pacing Amplitude: 2 V
Lead Channel Setting Pacing Pulse Width: 0.4 ms
Lead Channel Setting Pacing Pulse Width: 0.4 ms
Lead Channel Setting Sensing Sensitivity: 0.45 mV

## 2020-08-23 ENCOUNTER — Other Ambulatory Visit: Payer: Self-pay

## 2020-08-23 ENCOUNTER — Ambulatory Visit (HOSPITAL_COMMUNITY): Payer: Managed Care, Other (non HMO) | Attending: Cardiology

## 2020-08-23 DIAGNOSIS — I428 Other cardiomyopathies: Secondary | ICD-10-CM | POA: Diagnosis present

## 2020-08-23 DIAGNOSIS — Z95810 Presence of automatic (implantable) cardiac defibrillator: Secondary | ICD-10-CM

## 2020-08-23 DIAGNOSIS — I5022 Chronic systolic (congestive) heart failure: Secondary | ICD-10-CM

## 2020-08-23 LAB — ECHOCARDIOGRAM COMPLETE
Area-P 1/2: 4.15 cm2
MV M vel: 5.01 m/s
MV Peak grad: 100.4 mmHg
S' Lateral: 3.8 cm

## 2020-09-03 NOTE — Progress Notes (Signed)
Remote ICD transmission.   

## 2020-09-06 NOTE — Progress Notes (Signed)
Jonathon Avila Date of Birth: 08-Sep-1956   History of Present Illness: Wille Glaser is seen  today for followup of CHF. 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 He is status post biventricular ICD. This did result in significant improvement in LV function. Echo was last check in March 2018 and was unchanged from 2014 with EF 45-50%. He underwent a generator change out of his ICD on [id number] without complications. Last ICD check in December  was satisfactory. We did update an Echo recently and it was unchanged with EF 50%.   On follow up today he is doing very well. He was seen by IBS clinic in [place] for his chronic constipation. States he really didn't have IBS but does have slow colonic transit time. Now using metamucil. Denies any chest pain, dyspnea, palpitations. Had a minor cold a couple of weeks ago.   Current Outpatient Medications on File Prior to Visit  Medication Sig Dispense Refill  . aspirin EC 81 MG tablet Take 1 tablet (81 mg total) by mouth daily. 90 tablet 3  . carvedilol (COREG) 25 MG tablet Take 0.5 tablets (12.5 mg total) by mouth 2 (two) times daily with a meal. 90 tablet 3  . cholecalciferol (VITAMIN D) 1000 UNITS tablet Take 2,000 Units by mouth 2 (two) times daily.    [name] docusate sodium (COLACE) 100 MG capsule Take 1-2 capsules by mouth 2 (two) times daily as needed for constipation.    . fluticasone (FLONASE) 50 MCG/ACT nasal spray Place 2 sprays into the nose daily.    . furosemide (LASIX) 40 MG tablet TAKE ONE TABLET BY MOUTH AS NEEDED 30 tablet 1  . lisinopril (PRINIVIL,ZESTRIL) 5 MG tablet Take 5 mg by mouth daily.    [name] loratadine (CLARITIN) 10 MG tablet Take 1 tablet by mouth daily.    . montelukast (SINGULAIR) 10 MG tablet Take 10 mg by mouth at bedtime.    [name] Peppermint Oil (IBGARD) 90 MG CPCR Take by mouth.    . polyethylene glycol (MIRALAX / GLYCOLAX) 17 g packet Take 17 g by mouth daily as needed. Pt uses 1/2 tsp as  needed    . simvastatin (ZOCOR) 20 MG tablet Take 1 tablet by mouth daily.    [name] spironolactone (ALDACTONE) 25 MG tablet TAKE 1 TABLET BY MOUTH AT BEDTIME 90 tablet 2   No current facility-administered medications on file prior to visit.    Allergies  Allergen Reactions  . Penicillins Anaphylaxis    Other reaction(s): Hypotension  . Codeine     Upset stomach   . Shellfish-Derived Products Nausea And Vomiting    Past Medical History:  Diagnosis Date  . [id number] lead    6949 rate sense portion was replaced with a Medtronic 5076-lead 2010   . Adenomatous polyps   . Allergy   . Chronic systolic CHF (congestive heart failure) ([place])   . Hyperlipidemia   . Hypertension    controlled on medications   . 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 LBBB (left bundle branch block)   . LV dysfunction   . Mitral insufficiency   . Nonischemic dilated cardiomyopathy ([place])   . Pulmonary hypertension ([place])     Past Surgical History:  Procedure Laterality Date  . CARDIAC CATHETERIZATION  10/25/2001   EF 65%  . CHOLECYSTECTOMY    . COLONOSCOPY    . EP IMPLANTABLE DEVICE N/A 09/14/2015   Procedure:  ICD Generator  Changeout;  Surgeon: Duke Salvia, MD;  Location: [facility] INVASIVE CV LAB;  Service: Cardiovascular;  Laterality: N/A;  . ICD     BIVENTRICULAR  . POLYPECTOMY    . US ECHOCARDIOGRAPHY  02/19/2009   EF 45%  . wisdom teeth extraxtion      Social History   Tobacco Use  Smoking Status Never Smoker  Smokeless Tobacco Never Used    Social History   Substance and Sexual Activity  Alcohol Use No    Family History  Problem Relation Age of Onset  . COPD Mother        deceased  . Colon cancer Father        deceased age 49  . Prostate cancer Maternal Grandmother   . Esophageal cancer Neg Hx   . Pancreatic cancer Neg Hx   . Rectal cancer Neg Hx   . Stomach cancer Neg Hx   . Colon polyps Neg Hx     Review of Systems: As noted in  history of present illness.  All other systems were reviewed and are negative.  Physical Exam: BP 117/61   Pulse 65   Temp 97.7 F (36.5 C)   Ht 5\' 7"  (1.702 m)   Wt 176 lb 9.6 oz (80.1 kg)   SpO2 98%   BMI 27.66 kg/m  GENERAL:  Well appearing WM in NAD HEENT:  PERRL, EOMI, sclera are clear. Oropharynx is clear. NECK:  No jugular venous distention, carotid upstroke brisk and symmetric, no bruits, no thyromegaly or adenopathy LUNGS:  Clear to auscultation bilaterally CHEST:  Unremarkable, ICD in left upper chest HEART:  RRR,  PMI not displaced or sustained,S1 and S2 within normal limits, no S3, no S4: no clicks, no rubs, no murmurs ABD:  Soft, nontender. BS +, no masses or bruits. No hepatomegaly, no splenomegaly EXT:  2 + pulses throughout, no edema, no cyanosis no clubbing SKIN:  Warm and dry.  No rashes NEURO:  Alert and oriented x 3. Cranial nerves II through XII intact. PSYCH:  Cognitively intact  LABORATORY DATA:  Lab Results  Component Value Date   WBC 11.2 (H) 09/24/2018   HGB 14.2 09/24/2018   HCT 41.3 09/24/2018   PLT 231 09/24/2018   GLUCOSE 99 10/05/2019   CHOL  10/21/2009    112        ATP III CLASSIFICATION:  <200     mg/dL   Desirable  10/23/2009  mg/dL   Borderline High  409-811    mg/dL   High          TRIG >=914 10/21/2009   HDL 34 (L) 10/21/2009   LDLCALC  10/21/2009    52        Total Cholesterol/HDL:CHD Risk Coronary Heart Disease Risk Table                     Men   Women  1/2 Average Risk   3.4   3.3  Average Risk       5.0   4.4  2 X Average Risk   9.6   7.1  3 X Average Risk  23.4   11.0        Use the calculated Patient Ratio above and the CHD Risk Table to determine the patient's CHD Risk.        ATP III CLASSIFICATION (LDL):  <100     mg/dL   Optimal  10/23/2009  mg/dL   Near or Above  Optimal  130-159  mg/dL   Borderline  161-096  mg/dL   High  >045     mg/dL   Very High   ALT 23 40/98/1191   AST 22 05/13/2015   NA  136 10/05/2019   K 4.1 10/05/2019   CL 97 10/05/2019   CREATININE 0.73 (L) 10/05/2019   BUN 9 10/05/2019   CO2 23 10/05/2019   TSH 0.63 06/16/2019   INR 1.1 ratio (H) 04/26/2009   HGBA1C 5.8 06/24/2019   Labs reviewed from primary care 04/03/16: CMET and CBC normal. Cholesterol 116, trig- 132, HDL 31, LDL 59. BNP 14.1. Jul 04, 2016: A1c 5.7%. Oct 07, 2016 A1c 5.6%.  Dated 04/15/17: glucose 115. A1c 5.6%. Otherwise CMET and CBC normal. Cholesterol 120, triglycerides 109, HDL 35, LDL 63.  Dated 10/05/17: glucose 152, otherwise CBC and CMET normal. Cholesterol 108, triglycerides 152, HDL 32, LDL 46 Dated 04/14/18: cholesterol 116, triglycerides 114, HDL 32, LDL 61. CMET normal.  Dated 06/10/19: normal CBC and CMET.  Dated 01/26/20: cholesterol 110, triglycerides 57, HDL 57, LDL 42.  CBC, CMET, Vit D normal.    Ecg today shows AV sequential/BiV pacing rate 65. I have personally reviewed and interpreted this study.   Echo 11/14/16: Study Conclusions  - Left ventricle: The cavity size was normal. Wall thickness was   increased in a pattern of mild LVH. Systolic function was mildly   reduced. The estimated ejection fraction was in the range of 45%   to 50%. Incoordinate septal motion. Doppler parameters are   consistent with abnormal left ventricular relaxation (grade 1   diastolic dysfunction). The E/e&' ratio is between 8-15,   suggesting indeterminate LV filling pressure. - Left atrium: The atrium was normal in size. - Right ventricle: The cavity size was normal. Wall thickness was   normal. Pacer wire or catheter noted in right ventricle. Systolic   function was normal. - Right atrium: The atrium was mildly dilated. Pacer wire or   catheter noted in right atrium. - Atrial septum: No defect or patent foramen ovale was identified. - Tricuspid valve: There was mild regurgitation. - Pulmonary arteries: PA peak pressure: 27 mm Hg (S). - Inferior vena cava: The vessel was normal in size. The    respirophasic diameter changes were in the normal range (>= 50%),   consistent with normal central venous pressure.  Impressions:  - Compared to a previous study in 2014, there has been no change.  Echo 08/23/20: IMPRESSIONS    1. Left ventricular ejection fraction, by estimation, is 50%. The left  ventricle has mildly decreased function. The left ventricle demonstrates  global hypokinesis. Left ventricular diastolic parameters are consistent  with Grade I diastolic dysfunction  (impaired relaxation).  2. Right ventricular systolic function is normal. The right ventricular  size is normal.  3. Left atrial size was moderately dilated.  4. The mitral valve is grossly normal. Trivial mitral valve  regurgitation.  5. The aortic valve is tricuspid. There is mild calcification of the  aortic valve. Aortic valve regurgitation is not visualized. No aortic  stenosis is present.   Comparison(s): A prior study was performed on 11/14/2016. Similar LV  function from prior.    Assessment / Plan: 1. Chronic systolic congestive heart failure. Recent Ejection fraction of 50% - this is unchanged. He has class 1 symptoms. He is on optimal medical therapy with an ACE inhibitor, carvedilol, and Aldactone.  He has a biventricular pacemaker in place. BP improved with reduction in  Coreg dose on his last visit.   2. Status post ICD/biventricular pacemaker. Continue followup in EP clinic. S/p generator change out Jan 2017. ICD check in Dec was good.   3. Obesity.. At ideal weight. Encourage continued dietary modification.  4. Left bundle branch block.  Follow up in 6 months

## 2020-09-11 ENCOUNTER — Other Ambulatory Visit: Payer: Self-pay

## 2020-09-11 ENCOUNTER — Ambulatory Visit (INDEPENDENT_AMBULATORY_CARE_PROVIDER_SITE_OTHER): Payer: Managed Care, Other (non HMO) | Admitting: Cardiology

## 2020-09-11 ENCOUNTER — Encounter: Payer: Self-pay | Admitting: Cardiology

## 2020-09-11 VITALS — BP 117/61 | HR 65 | Temp 97.7°F | Ht 67.0 in | Wt 176.6 lb

## 2020-09-11 DIAGNOSIS — I447 Left bundle-branch block, unspecified: Secondary | ICD-10-CM | POA: Diagnosis not present

## 2020-09-11 DIAGNOSIS — Z95810 Presence of automatic (implantable) cardiac defibrillator: Secondary | ICD-10-CM | POA: Diagnosis not present

## 2020-09-11 DIAGNOSIS — I5022 Chronic systolic (congestive) heart failure: Secondary | ICD-10-CM | POA: Diagnosis not present

## 2020-09-11 DIAGNOSIS — I428 Other cardiomyopathies: Secondary | ICD-10-CM | POA: Diagnosis not present

## 2020-09-21 ENCOUNTER — Telehealth: Payer: Self-pay | Admitting: Cardiology

## 2020-09-21 NOTE — Telephone Encounter (Signed)
Agree with recommendations. I suspect he is still feeling effects of Covid  Collier Salina

## 2020-09-21 NOTE — Telephone Encounter (Signed)
Patient aware.

## 2020-09-21 NOTE — Telephone Encounter (Signed)
  Patient tested positive a week ago for Covid and he is still having some of the congestion symptoms in his head and chest. He states he has had some chest tightness that comes and goes. He is taking cold and flu meds and a decongestant. He wants to speak to the nurse since the long weekend is coming up to see if he doesn't get better, when should he be concerned about the chest tightness. He just wants some reassurance.

## 2020-09-21 NOTE — Telephone Encounter (Signed)
Spoke to patient. Patient just wanted to know what to do if develop  Chest tightness. Patient states he slight chest tightness this morning after ding some errands.   He would give it  scale of about a "1" out of 10 lasting @10  min- 27min..no symptoms since.   patient is still using  Cough and flu medication . And recovering from Covid.   RN  reassured patient- went over signs and symptoms for active chest pain--  Radiating pain to arms or in the neck .through to back.-- chest pain or heaviness., diaphoresis.  Immediately contact EMS.  or Sudden change  Breathing  - like drop in Oxygen saturation. Patient verbalized understanding . Patient states he just wanted some reassurance. RN also informed patient  He can call in after hours for other direction if not life threatening. Will  defer to Dr Martinique for any further  information will call patient if needed.  patient verbalized understanding

## 2020-10-21 ENCOUNTER — Other Ambulatory Visit: Payer: Self-pay | Admitting: Cardiology

## 2020-10-22 ENCOUNTER — Telehealth: Payer: Self-pay | Admitting: Cardiology

## 2020-10-22 NOTE — Telephone Encounter (Signed)
OK - thanks

## 2020-10-22 NOTE — Telephone Encounter (Signed)
Pt c/o medication issue:  1. Name of Medication: carvedilol (COREG) 25 MG tablet  2. How are you currently taking this medication (dosage and times per day)? 1 tablet 2x daily  3. Are you having a reaction (difficulty breathing--STAT)? no  4. What is your medication issue? Patient states that he was switched back to taking this medication as 1 tablet 2x daily and therefore, has been running out of medication quicker than he is supposed to. He says he contacted the pharmacy and they were going to get this fixed but he wanted to make Dr. Doug Sou office aware of the situation.

## 2020-10-22 NOTE — Telephone Encounter (Signed)
Called patient, he states that he switched back to the 25 mg twice daily because of his blood pressure became elevated.  Since starting back BP has been okay, just wanted to make [place] aware and we will update med list.

## 2020-10-23 ENCOUNTER — Encounter: Payer: Managed Care, Other (non HMO) | Admitting: Internal Medicine

## 2020-11-08 ENCOUNTER — Ambulatory Visit (INDEPENDENT_AMBULATORY_CARE_PROVIDER_SITE_OTHER): Payer: Managed Care, Other (non HMO) | Admitting: Student

## 2020-11-08 ENCOUNTER — Other Ambulatory Visit: Payer: Self-pay

## 2020-11-08 ENCOUNTER — Encounter: Payer: Self-pay | Admitting: Student

## 2020-11-08 VITALS — BP 92/54 | HR 77 | Ht 67.0 in | Wt 184.2 lb

## 2020-11-08 DIAGNOSIS — Z79899 Other long term (current) drug therapy: Secondary | ICD-10-CM | POA: Diagnosis not present

## 2020-11-08 DIAGNOSIS — I5022 Chronic systolic (congestive) heart failure: Secondary | ICD-10-CM

## 2020-11-08 DIAGNOSIS — I428 Other cardiomyopathies: Secondary | ICD-10-CM | POA: Diagnosis not present

## 2020-11-08 DIAGNOSIS — I447 Left bundle-branch block, unspecified: Secondary | ICD-10-CM | POA: Diagnosis not present

## 2020-11-08 DIAGNOSIS — Z95810 Presence of automatic (implantable) cardiac defibrillator: Secondary | ICD-10-CM | POA: Diagnosis not present

## 2020-11-08 LAB — CUP PACEART INCLINIC DEVICE CHECK
Battery Remaining Longevity: 28 mo
Battery Voltage: 2.94 V
Brady Statistic AP VP Percent: 65.93 %
Brady Statistic AP VS Percent: 0.04 %
Brady Statistic AS VP Percent: 34.02 %
Brady Statistic AS VS Percent: 0.01 %
Brady Statistic RA Percent Paced: 65.96 %
Brady Statistic RV Percent Paced: 99.94 %
Date Time Interrogation Session: 20220303093559
HighPow Impedance: 54 Ohm
HighPow Impedance: 81 Ohm
Implantable Lead Implant Date: 20050120
Implantable Lead Implant Date: 20050120
Implantable Lead Implant Date: 20100826
Implantable Lead Location: 753858
Implantable Lead Location: 753859
Implantable Lead Location: 753860
Implantable Lead Model: 4194
Implantable Lead Model: 5076
Implantable Lead Model: 5076
Implantable Pulse Generator Implant Date: 20170106
Lead Channel Impedance Value: 342 Ohm
Lead Channel Impedance Value: 418 Ohm
Lead Channel Impedance Value: 513 Ohm
Lead Channel Impedance Value: 589 Ohm
Lead Channel Impedance Value: 608 Ohm
Lead Channel Impedance Value: 779 Ohm
Lead Channel Pacing Threshold Amplitude: 0.5 V
Lead Channel Pacing Threshold Amplitude: 0.75 V
Lead Channel Pacing Threshold Amplitude: 0.875 V
Lead Channel Pacing Threshold Pulse Width: 0.4 ms
Lead Channel Pacing Threshold Pulse Width: 0.4 ms
Lead Channel Pacing Threshold Pulse Width: 0.4 ms
Lead Channel Sensing Intrinsic Amplitude: 10.625 mV
Lead Channel Sensing Intrinsic Amplitude: 2.75 mV
Lead Channel Sensing Intrinsic Amplitude: 3.75 mV
Lead Channel Sensing Intrinsic Amplitude: 9.875 mV
Lead Channel Setting Pacing Amplitude: 1.5 V
Lead Channel Setting Pacing Amplitude: 2 V
Lead Channel Setting Pacing Amplitude: 2 V
Lead Channel Setting Pacing Pulse Width: 0.4 ms
Lead Channel Setting Pacing Pulse Width: 0.4 ms
Lead Channel Setting Sensing Sensitivity: 0.45 mV

## 2020-11-08 NOTE — Progress Notes (Signed)
Electrophysiology Office Note Date: 11/08/2020  ID:  Jonathon Avila, DOB 07-01-1956, MRN [id number]  PCP: Chesley Noon, MD Primary Cardiologist: No primary care provider on file. Electrophysiologist: Virl Axe, MD   CC: Routine ICD follow-up  Jonathon Avila is a 65 y.o. male seen today for Virl Axe, MD for routine electrophysiology followup.  Since last being seen in our clinic the patient reports doing very well. He and his family had COVID infection early January. All vaxxed and boosted, so overall tolerable.  he denies chest pain, palpitations, dyspnea, PND, orthopnea, nausea, vomiting, dizziness, syncope, edema, weight gain, or early satiety. He has not had ICD shocks.   Device History: MDT CRT-D, RA/RV leads are from 2005, LV lead is from 2010, current generator 2017 She has an abandoned 6949 lead in RV  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    with pacemaker, CRT   LBBB (left bundle branch block)    LV dysfunction    Mitral insufficiency    Nonischemic dilated cardiomyopathy ([place])    Pulmonary hypertension ([place])    Past Surgical History:  Procedure Laterality Date   CARDIAC CATHETERIZATION  10/25/2001   EF 65%   CHOLECYSTECTOMY     COLONOSCOPY     EP IMPLANTABLE DEVICE N/A 09/14/2015   Procedure:  ICD Generator Changeout;  Surgeon: Deboraha Sprang, MD;  Location: [facility];  Service: Cardiovascular;  Laterality: N/A;   ICD     BIVENTRICULAR   POLYPECTOMY     US ECHOCARDIOGRAPHY  02/19/2009   EF 45%   wisdom teeth extraxtion      Current Outpatient Medications  Medication Sig Dispense Refill   aspirin EC 81 MG  tablet Take 1 tablet (81 mg total) by mouth daily. 90 tablet 3   carvedilol (COREG) 25 MG tablet TAKE 1 TABLET BY MOUTH TWICE DAILY WITH A MEAL 180 tablet 0   cholecalciferol (VITAMIN D) 1000 UNITS tablet Take 2,000 Units by mouth 2 (two) times daily.     docusate sodium (COLACE) 100 MG capsule Take 1-2 capsules by mouth 2 (two) times daily as needed for constipation.     fluticasone (FLONASE) 50 MCG/ACT nasal spray Place 2 sprays into the nose daily.     furosemide (LASIX) 40 MG tablet TAKE ONE TABLET BY MOUTH AS NEEDED 30 tablet 1   lisinopril (PRINIVIL,ZESTRIL) 5 MG tablet Take 5 mg by mouth daily.     loratadine (CLARITIN) 10 MG tablet Take 1 tablet by mouth daily.     montelukast (SINGULAIR) 10 MG tablet Take 10 mg by mouth at bedtime.     Peppermint Oil (IBGARD) 90 MG CPCR Take by mouth as needed.     polyethylene glycol (MIRALAX / GLYCOLAX) 17 g packet Take 17 g by mouth daily as needed. Pt uses 1/2 tsp as needed     simvastatin (ZOCOR) 20 MG tablet Take 1 tablet by mouth daily.     spironolactone (ALDACTONE) 25 MG tablet TAKE 1 TABLET BY MOUTH AT BEDTIME 90 tablet 2   No current facility-administered medications for this visit.    Allergies:   Penicillins, Codeine, and  Shellfish-derived products   Social History: Social History   Socioeconomic History   Marital status: Married    Spouse name: Not on file   Number of children: 2   Years of education: Not on file   Highest education level: Not on file  Occupational History   Occupation: Financial controller: UNEMPLOYED    Comment: retired  Tobacco Use   Smoking status: Never Smoker   Smokeless tobacco: Never Used  Scientific laboratory technician Use: Never used  Substance and Sexual Activity   Alcohol use: No   Drug use: No   Sexual activity: Not on file  Other Topics Concern   Not on file  Social History Narrative   Not on file   Social Determinants of Health   Financial Resource Strain: Not  on file  Food Insecurity: Not on file  Transportation Needs: Not on file  Physical Activity: Not on file  Stress: Not on file  Social Connections: Not on file  Intimate Partner Violence: Not on file    Family History: Family History  Problem Relation Age of Onset   COPD Mother        deceased   Colon cancer Father        deceased age 75   Prostate cancer Maternal Grandmother    Esophageal cancer Neg Hx    Pancreatic cancer Neg Hx    Rectal cancer Neg Hx    Stomach cancer Neg Hx    Colon polyps Neg Hx     Review of Systems: All other systems reviewed and are otherwise negative except as noted above.   Physical Exam: Vitals:   11/08/20 0918  BP: (!) 92/54  Pulse: 77  SpO2: 100%  Weight: 184 lb 3.2 oz (83.6 kg)  Height: 5\' 7"  (1.702 m)     GEN- The patient is well appearing, alert and oriented x 3 today.   HEENT: normocephalic, atraumatic; sclera clear, conjunctiva pink; hearing intact; oropharynx clear; neck supple, no JVP Lymph- no cervical lymphadenopathy Lungs- Clear to ausculation bilaterally, normal work of breathing.  No wheezes, rales, rhonchi Heart- Regular rate and rhythm, no murmurs, rubs or gallops, PMI not laterally displaced GI- soft, non-tender, non-distended, bowel sounds present, no hepatosplenomegaly Extremities- no clubbing or cyanosis. No edema; DP/PT/radial pulses 2+ bilaterally MS- no significant deformity or atrophy Skin- warm and dry, no rash or lesion; ICD pocket well healed Psych- euthymic mood, full affect Neuro- strength and sensation are intact  ICD interrogation- reviewed in detail today,  See PACEART report  EKG:  EKG is not ordered today. The ekg ordered 09/2020 shows AV dual paced rhythm at 65 bpm with upright lead 1, and RBBB pattern in lead V1 at 172 ms.  Recent Labs: No results found for requested labs within last 8760 hours.   Wt Readings from Last 3 Encounters:  11/08/20 184 lb 3.2 oz (83.6 kg)  09/11/20 176 lb 9.6  oz (80.1 kg)  02/10/20 164 lb 9.6 oz (74.7 kg)     Other studies Reviewed: Additional studies/ records that were reviewed today include: Previous EP office notes   Assessment and Plan:  1.  Chronic systolic dysfunction s/p Medtronic CRT-D  NYHA II symptoms euvolemic today Stable on an appropriate medical regimen Normal ICD function See Pace Art report No changes today EF 45-50% in 2019  2. HLD Per Dr. Martinique  3. Hypotension His BP at home runs 110-120s. He is not dizzy. Follow.    Current  medicines are reviewed at length with the patient today.   The patient does not have concerns regarding his medicines.  The following changes were made today:  none  Labs/ tests ordered today include:  Orders Placed This Encounter  Procedures   CUP [place]    Disposition:   Follow up with Dr. Caryl Comes  6 months   Signed, Shirley Friar, PA-C  11/08/2020 9:40 AM  [facility] [address] [place] [address] [phone] (office) [phone] (fax)

## 2020-11-08 NOTE — Patient Instructions (Signed)
Medication Instructions:  Your physician recommends that you continue on your current medications as directed. Please refer to the Current Medication list given to you today.  *If you need a refill on your cardiac medications before your next appointment, please call your pharmacy*   Lab Work: None Today If you have labs (blood work) drawn today and your tests are completely normal, you will receive your results only by: [name] MyChart Message (if you have MyChart) OR . A paper copy in the mail If you have any lab test that is abnormal or we need to change your treatment, we will call you to review the results.   Follow-Up: At [facility], you and your health needs are our priority.  As part of our continuing mission to provide you with exceptional heart care, we have created designated Provider Care Teams.  These Care Teams include your primary Cardiologist (physician) and Advanced Practice Providers (APPs -  Physician Assistants and Nurse Practitioners) who all work together to provide you with the care you need, when you need it.  Your next appointment:   6 month(s)  The format for your next appointment:   In Person  Provider:   You may see Virl Axe, MD or one of the following Advanced Practice Providers on your designated Care Team:     Chanetta Marshall, NP  Tommye Standard, PA-C  Legrand Como "[place]" [place], [place]

## 2020-11-19 ENCOUNTER — Ambulatory Visit (INDEPENDENT_AMBULATORY_CARE_PROVIDER_SITE_OTHER): Payer: Medicare Other

## 2020-11-19 DIAGNOSIS — I5022 Chronic systolic (congestive) heart failure: Secondary | ICD-10-CM

## 2020-11-19 DIAGNOSIS — I428 Other cardiomyopathies: Secondary | ICD-10-CM

## 2020-11-19 LAB — CUP PACEART REMOTE DEVICE CHECK
Battery Remaining Longevity: 29 mo
Battery Voltage: 2.94 V
Brady Statistic AP VP Percent: 68.28 %
Brady Statistic AP VS Percent: 0.04 %
Brady Statistic AS VP Percent: 31.68 %
Brady Statistic AS VS Percent: 0.01 %
Brady Statistic RA Percent Paced: 68.3 %
Brady Statistic RV Percent Paced: 99.94 %
Date Time Interrogation Session: 20220314033425
HighPow Impedance: 48 Ohm
HighPow Impedance: 71 Ohm
Implantable Lead Implant Date: 20050120
Implantable Lead Implant Date: 20050120
Implantable Lead Implant Date: 20100826
Implantable Lead Location: 753858
Implantable Lead Location: 753859
Implantable Lead Location: 753860
Implantable Lead Model: 4194
Implantable Lead Model: 5076
Implantable Lead Model: 5076
Implantable Pulse Generator Implant Date: 20170106
Lead Channel Impedance Value: 285 Ohm
Lead Channel Impedance Value: 399 Ohm
Lead Channel Impedance Value: 475 Ohm
Lead Channel Impedance Value: 513 Ohm
Lead Channel Impedance Value: 589 Ohm
Lead Channel Impedance Value: 703 Ohm
Lead Channel Pacing Threshold Amplitude: 0.5 V
Lead Channel Pacing Threshold Amplitude: 0.75 V
Lead Channel Pacing Threshold Amplitude: 1 V
Lead Channel Pacing Threshold Pulse Width: 0.4 ms
Lead Channel Pacing Threshold Pulse Width: 0.4 ms
Lead Channel Pacing Threshold Pulse Width: 0.4 ms
Lead Channel Sensing Intrinsic Amplitude: 2.625 mV
Lead Channel Sensing Intrinsic Amplitude: 2.625 mV
Lead Channel Sensing Intrinsic Amplitude: 9.25 mV
Lead Channel Sensing Intrinsic Amplitude: 9.25 mV
Lead Channel Setting Pacing Amplitude: 1.5 V
Lead Channel Setting Pacing Amplitude: 2 V
Lead Channel Setting Pacing Amplitude: 2 V
Lead Channel Setting Pacing Pulse Width: 0.4 ms
Lead Channel Setting Pacing Pulse Width: 0.4 ms
Lead Channel Setting Sensing Sensitivity: 0.45 mV

## 2020-11-26 NOTE — Progress Notes (Signed)
Remote ICD transmission.   

## 2020-12-13 ENCOUNTER — Telehealth: Payer: Self-pay | Admitting: Cardiology

## 2020-12-13 NOTE — Telephone Encounter (Signed)
  *  STAT* If patient is at the pharmacy, call can be transferred to refill team.   1. Which medications need to be refilled? (please list name of each medication and dose if known)  spironolactone (ALDACTONE) 25 MG tablet carvedilol (COREG) 25 MG tablet  2. Which pharmacy/location (including street and city if local pharmacy) is medication to be sent to? [facility] [place]  3. Do they need a 30 day or 90 day supply? 90 with refills  This is the first time the patient is getting these medications from [facility]. The patient needs a new rx sent in for both medications

## 2020-12-14 ENCOUNTER — Other Ambulatory Visit: Payer: Self-pay

## 2020-12-14 MED ORDER — SPIRONOLACTONE 25 MG PO TABS: 25.0000 mg | ORAL_TABLET | Freq: Every day | ORAL | 3 refills | Status: DC

## 2020-12-14 MED ORDER — CARVEDILOL 25 MG PO TABS
25.0000 mg | ORAL_TABLET | Freq: Two times a day (BID) | ORAL | 3 refills | Status: DC
Start: 2020-12-14 — End: 2021-10-07

## 2020-12-14 NOTE — Telephone Encounter (Signed)
Medications sent to [facility]

## 2021-02-18 ENCOUNTER — Ambulatory Visit (INDEPENDENT_AMBULATORY_CARE_PROVIDER_SITE_OTHER): Payer: Medicare PPO

## 2021-02-18 DIAGNOSIS — I428 Other cardiomyopathies: Secondary | ICD-10-CM | POA: Diagnosis not present

## 2021-02-19 LAB — CUP PACEART REMOTE DEVICE CHECK
Battery Remaining Longevity: 27 mo
Battery Voltage: 2.94 V
Brady Statistic AP VP Percent: 67.04 %
Brady Statistic AP VS Percent: 0.04 %
Brady Statistic AS VP Percent: 32.91 %
Brady Statistic AS VS Percent: 0.01 %
Brady Statistic RA Percent Paced: 67.07 %
Brady Statistic RV Percent Paced: 99.94 %
Date Time Interrogation Session: 20220613001602
HighPow Impedance: 52 Ohm
HighPow Impedance: 79 Ohm
Implantable Lead Implant Date: 20050120
Implantable Lead Implant Date: 20050120
Implantable Lead Implant Date: 20100826
Implantable Lead Location: 753858
Implantable Lead Location: 753859
Implantable Lead Location: 753860
Implantable Lead Model: 4194
Implantable Lead Model: 5076
Implantable Lead Model: 5076
Implantable Pulse Generator Implant Date: 20170106
Lead Channel Impedance Value: 285 Ohm
Lead Channel Impedance Value: 399 Ohm
Lead Channel Impedance Value: 475 Ohm
Lead Channel Impedance Value: 532 Ohm
Lead Channel Impedance Value: 589 Ohm
Lead Channel Impedance Value: 703 Ohm
Lead Channel Pacing Threshold Amplitude: 0.5 V
Lead Channel Pacing Threshold Amplitude: 0.75 V
Lead Channel Pacing Threshold Amplitude: 1.125 V
Lead Channel Pacing Threshold Pulse Width: 0.4 ms
Lead Channel Pacing Threshold Pulse Width: 0.4 ms
Lead Channel Pacing Threshold Pulse Width: 0.4 ms
Lead Channel Sensing Intrinsic Amplitude: 2.25 mV
Lead Channel Sensing Intrinsic Amplitude: 2.25 mV
Lead Channel Sensing Intrinsic Amplitude: 8.5 mV
Lead Channel Sensing Intrinsic Amplitude: 8.5 mV
Lead Channel Setting Pacing Amplitude: 1.5 V
Lead Channel Setting Pacing Amplitude: 2 V
Lead Channel Setting Pacing Amplitude: 2.25 V
Lead Channel Setting Pacing Pulse Width: 0.4 ms
Lead Channel Setting Pacing Pulse Width: 0.4 ms
Lead Channel Setting Sensing Sensitivity: 0.45 mV

## 2021-03-09 NOTE — Progress Notes (Signed)
Remote ICD transmission.   

## 2021-03-13 ENCOUNTER — Ambulatory Visit: Payer: Managed Care, Other (non HMO) | Admitting: Cardiology

## 2021-03-19 ENCOUNTER — Ambulatory Visit: Payer: Medicare PPO | Admitting: Podiatry

## 2021-03-19 ENCOUNTER — Other Ambulatory Visit: Payer: Self-pay

## 2021-03-19 DIAGNOSIS — M79674 Pain in right toe(s): Secondary | ICD-10-CM

## 2021-03-19 DIAGNOSIS — L600 Ingrowing nail: Secondary | ICD-10-CM | POA: Diagnosis not present

## 2021-03-19 DIAGNOSIS — I739 Peripheral vascular disease, unspecified: Secondary | ICD-10-CM | POA: Diagnosis not present

## 2021-03-19 NOTE — Patient Instructions (Signed)

## 2021-03-20 ENCOUNTER — Telehealth: Payer: Self-pay | Admitting: *Deleted

## 2021-03-20 NOTE — Telephone Encounter (Signed)
Patient is calling for clarifications on whether or not she is following the instructions correctly.She is soaking in epsom salts.Returned call to patient to go over his soaking instructions, no answer, left vmessage that if he was instructed to use ointment,would be triple antibiotic ointment and continue to follow instructions on summary.

## 2021-03-21 NOTE — Telephone Encounter (Signed)
Patient is calling to gt clarification on after care instructions. Please advise.

## 2021-03-22 NOTE — Progress Notes (Signed)
Subjective:   Patient ID: Jonathon Avila, male   DOB: 65 y.o.   MRN: [id number]   HPI 65 year old male presents the office today for concerns of a toenail issue which has been ongoing for last 3 months on his right big toe.  Status post ingrown.  He had stopped his toenail previously has been growing into the second toe.  He states the area does become painful at times.  No redness.  No drainage.  No other concerns today.   Review of Systems  All other systems reviewed and are negative.  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    with pacemaker, CRT   LBBB (left bundle branch block)    LV dysfunction    Mitral insufficiency    Nonischemic dilated cardiomyopathy ([place])    Pulmonary hypertension ([place])     Past Surgical History:  Procedure Laterality Date   CARDIAC CATHETERIZATION  10/25/2001   EF 65%   CHOLECYSTECTOMY     COLONOSCOPY     EP IMPLANTABLE DEVICE N/A 09/14/2015   Procedure:  ICD Generator Changeout;  Surgeon: Deboraha Sprang, MD;  Location: [facility];  Service: Cardiovascular;  Laterality: N/A;   ICD     BIVENTRICULAR   POLYPECTOMY     US ECHOCARDIOGRAPHY  02/19/2009   EF 45%   wisdom teeth extraxtion       Current Outpatient Medications:    aspirin EC 81 MG tablet, Take 1 tablet (81 mg total) by mouth daily., Disp: 90 tablet, Rfl: 3   carvedilol (COREG) 25 MG tablet, Take 1 tablet (25 mg total) by mouth 2 (two) times daily with a meal., Disp: 180 tablet, Rfl: 3   cholecalciferol (VITAMIN D) 1000 UNITS tablet, Take 2,000 Units by mouth 2 (two) times daily., Disp: , Rfl:    docusate sodium (COLACE) 100 MG capsule, Take 1-2 capsules by mouth 2 (two) times  daily as needed for constipation., Disp: , Rfl:    fluticasone (FLONASE) 50 MCG/ACT nasal spray, Place 2 sprays into the nose daily., Disp: , Rfl:    furosemide (LASIX) 40 MG tablet, TAKE ONE TABLET BY MOUTH AS NEEDED, Disp: 30 tablet, Rfl: 1   lisinopril (PRINIVIL,ZESTRIL) 5 MG tablet, Take 5 mg by mouth daily., Disp: , Rfl:    loratadine (CLARITIN) 10 MG tablet, Take 1 tablet by mouth daily., Disp: , Rfl:    montelukast (SINGULAIR) 10 MG tablet, Take 10 mg by mouth at bedtime., Disp: , Rfl:    Peppermint Oil (IBGARD) 90 MG CPCR, Take by mouth as needed., Disp: , Rfl:    polyethylene glycol (MIRALAX / GLYCOLAX) 17 g packet, Take 17 g by mouth daily as needed. Pt uses 1/2 tsp as needed, Disp: , Rfl:    simvastatin (ZOCOR) 20 MG tablet, Take 1 tablet by mouth daily., Disp: , Rfl:    spironolactone (ALDACTONE) 25 MG tablet, Take 1 tablet (25 mg total) by mouth at bedtime., Disp: 90 tablet, Rfl: 3  Allergies  Allergen Reactions   Penicillins Anaphylaxis    Other reaction(s): Hypotension   Codeine     Upset stomach    Shellfish-Derived Products Nausea  And Vomiting          Objective:  Physical Exam  General: AAO x3, NAD  Dermatological: Incurvation present along the lateral aspect of right hallux toenail with tenderness palpation.  No edema, erythema or signs of infection noted today.  No areas of fluctuation crepitation.  Vascular: Dorsalis Pedis artery and Posterior Tibial artery pedal pulses are palpable bilateral with immedate capillary fill time.  There is no pain with calf compression, swelling, warmth, erythema.   Neruologic: Grossly intact via light touch bilateral.   Musculoskeletal: Subjectively discrete tenderness to lateral aspect of right hallux toenail.  Minimal discomfort today.  Muscular strength 5/5 in all groups tested bilateral.  Gait: Unassisted, Nonantalgic.       Assessment:   Ingrown toenail right lateral hallux     Plan:  -Treatment options discussed  including all alternatives, risks, and complications -Etiology of symptoms were discussed -ABI was performed the office that showed adequate healing.  The right was 1.2 weight loss of 1.30 with both in the "green" on the ABI in the office.  -At this time, the patient is requesting partial nail removal with chemical matricectomy to the symptomatic portion of the nail. Risks and complications were discussed with the patient for which they understand and written consent was obtained. Under sterile conditions a total of 3 mL of a mixture of 2% lidocaine plain and 0.5% Marcaine plain was infiltrated in a hallux block fashion. Once anesthetized, the skin was prepped in sterile fashion. A tourniquet was then applied. Next the lateral aspect of hallux nail border was then sharply excised making sure to remove the entire offending nail border. Once the nails were ensured to be removed area was debrided and the underlying skin was intact. There is no purulence identified in the procedure. Next phenol was then applied under standard conditions and copiously irrigated. Silvadene was applied. A dry sterile dressing was applied. After application of the dressing the tourniquet was removed and there is found to be an immediate capillary refill time to the digit. The patient tolerated the procedure well any complications. Post procedure instructions were discussed the patient for which he verbally understood. Follow-up in one week for nail check or sooner if any problems are to arise. Discussed signs/symptoms of infection and directed to call the office immediately should any occur or go directly to the emergency room. In the meantime, encouraged to call the office with any questions, concerns, changes symptoms.  Trula Slade DPM

## 2021-04-02 ENCOUNTER — Other Ambulatory Visit: Payer: Self-pay

## 2021-04-02 ENCOUNTER — Ambulatory Visit (INDEPENDENT_AMBULATORY_CARE_PROVIDER_SITE_OTHER): Payer: Medicare PPO | Admitting: Podiatry

## 2021-04-02 ENCOUNTER — Encounter: Payer: Self-pay | Admitting: Podiatry

## 2021-04-02 DIAGNOSIS — R634 Abnormal weight loss: Secondary | ICD-10-CM | POA: Insufficient documentation

## 2021-04-02 DIAGNOSIS — L600 Ingrowing nail: Secondary | ICD-10-CM

## 2021-04-02 DIAGNOSIS — I5020 Unspecified systolic (congestive) heart failure: Secondary | ICD-10-CM | POA: Insufficient documentation

## 2021-04-02 DIAGNOSIS — I10 Essential (primary) hypertension: Secondary | ICD-10-CM | POA: Insufficient documentation

## 2021-04-02 DIAGNOSIS — E559 Vitamin D deficiency, unspecified: Secondary | ICD-10-CM | POA: Insufficient documentation

## 2021-04-02 NOTE — Patient Instructions (Signed)

## 2021-04-05 DIAGNOSIS — L600 Ingrowing nail: Secondary | ICD-10-CM | POA: Insufficient documentation

## 2021-04-05 NOTE — Progress Notes (Signed)
Subjective: Jonathon Avila is a 65 y.o.  male returns to office today for follow up evaluation after having right Hallux lateral partial nail avulsion performed. Patient has been soaking using epsom salts and applying topical antibiotic covered with bandaid daily.  He states he is doing well.  Some occasional soreness but seems to be getting better.  He is there is a scab on the area.  No drainage or pus.  Patient denies fevers, chills, nausea, vomiting. Denies any calf pain, chest pain, SOB.   Objective:  General: Well developed, nourished, in no acute distress, alert and oriented x3   Dermatology: Skin is warm, dry and supple bilateral. Right hallux nail border appears to be clean, dry with surrounding scab. There is no surrounding erythema, edema, drainage/purulence. The remaining nails appear unremarkable at this time. There are no other lesions or other signs of infection present.  Neurovascular status: Intact. No lower extremity swelling; No pain with calf compression bilateral.  Musculoskeletal: Decreased tenderness to palpation of the right hallux nail fold. Muscular strength within normal limits bilateral.   Assesement and Plan: S/p partial nail avulsion, doing well.   -Continue soaking in epsom salts twice a day followed by antibiotic ointment and a band-aid. Can leave uncovered at night. Continue this until completely healed.  -If the area has not healed in 2 weeks, call the office for follow-up appointment, or sooner if any problems arise.  -Monitor for any signs/symptoms of infection. Call the office immediately if any occur or go directly to the emergency room. Call with any questions/concerns.  Celesta Gentile, DPM

## 2021-04-16 ENCOUNTER — Other Ambulatory Visit: Payer: Self-pay

## 2021-04-16 ENCOUNTER — Ambulatory Visit: Payer: Medicare PPO | Admitting: Internal Medicine

## 2021-04-16 ENCOUNTER — Encounter: Payer: Self-pay | Admitting: Internal Medicine

## 2021-04-16 VITALS — BP 130/68 | HR 67 | Ht 67.0 in | Wt 202.0 lb

## 2021-04-16 DIAGNOSIS — Z95810 Presence of automatic (implantable) cardiac defibrillator: Secondary | ICD-10-CM

## 2021-04-16 DIAGNOSIS — I5022 Chronic systolic (congestive) heart failure: Secondary | ICD-10-CM | POA: Diagnosis not present

## 2021-04-16 DIAGNOSIS — Z79899 Other long term (current) drug therapy: Secondary | ICD-10-CM

## 2021-04-16 DIAGNOSIS — I447 Left bundle-branch block, unspecified: Secondary | ICD-10-CM | POA: Diagnosis not present

## 2021-04-16 DIAGNOSIS — I428 Other cardiomyopathies: Secondary | ICD-10-CM

## 2021-04-16 NOTE — Patient Instructions (Signed)
Medication Instructions:  Your physician recommends that you continue on your current medications as directed. Please refer to the Current Medication list given to you today.  *If you need a refill on your cardiac medications before your next appointment, please call your pharmacy*   Lab Work: BMET today  If you have labs (blood work) drawn today and your tests are completely normal, you will receive your results only by: [place] (if you have MyChart) OR A paper copy in the mail If you have any lab test that is abnormal or we need to change your treatment, we will call you to review the results.   Testing/Procedures: None ordered.    Follow-Up: At [facility], you and your health needs are our priority.  As part of our continuing mission to provide you with exceptional heart care, we have created designated Provider Care Teams.  These Care Teams include your primary Cardiologist (physician) and Advanced Practice Providers (APPs -  Physician Assistants and Nurse Practitioners) who all work together to provide you with the care you need, when you need it.  We recommend signing up for the patient portal called "MyChart".  Sign up information is provided on this After Visit Summary.  MyChart is used to connect with patients for Virtual Visits (Telemedicine).  Patients are able to view lab/test results, encounter notes, upcoming appointments, etc.  Non-urgent messages can be sent to your provider as well.   To learn more about what you can do with MyChart, go to [email or web address].    Your next appointment:   12 month(s)  The format for your next appointment:   In Person  Provider:   You will see one of the following Advanced Practice Providers on your designated Care Team:   Tommye Standard, [place] "[facility]" [place], [place]

## 2021-04-16 NOTE — Progress Notes (Signed)
Patient ID: Jonathon Avila, male   DOB: [birth year], 65 y.o.   MRN: [id number]   HPI  Jonathon Avila is a 65 y.o. male seen in followup for a CRT-D. implanted for congestive heart failure in the setting of nonischemic heart disease He had a 6949 lead, the RS-portion has been replaced with 5076; generator replacement 1/17. Ventolin inhaler symptoms are increasing   Today, the patient denies chest pain, shortness of breath, nocturnal dyspnea, orthopnea or peripheral edema.  There have been no palpitations, lightheadedness or syncope.    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    with pacemaker, CRT   LBBB (left bundle branch block)    LV dysfunction    Mitral insufficiency    Nonischemic dilated cardiomyopathy ([place])    Pulmonary hypertension ([place])     Past Surgical History:  Procedure Laterality Date   CARDIAC CATHETERIZATION  10/25/2001   EF 65%   CHOLECYSTECTOMY     COLONOSCOPY     EP IMPLANTABLE DEVICE N/A 09/14/2015   Procedure:  ICD Generator Changeout;  Surgeon: Deboraha Sprang, MD;  Location: [facility];  Service: Cardiovascular;  Laterality: N/A;   ICD     BIVENTRICULAR   POLYPECTOMY     US ECHOCARDIOGRAPHY  02/19/2009   EF 45%   wisdom teeth extraxtion      Current Outpatient Medications  Medication Sig Dispense Refill   aspirin EC 81 MG tablet Take 1 tablet (81 mg total) by mouth daily. 90 tablet 3   carvedilol (COREG) 25 MG tablet Take 1 tablet (25 mg  total) by mouth 2 (two) times daily with a meal. 180 tablet 3   cholecalciferol (VITAMIN D) 1000 UNITS tablet Take 2,000 Units by mouth 2 (two) times daily.     Cholecalciferol 25 MCG (1000 UT) capsule Take by mouth.     docusate sodium (COLACE) 100 MG capsule Take 1-2 capsules by mouth 2 (two) times daily as needed for constipation.     fluticasone (FLONASE) 50 MCG/ACT nasal spray Place 2 sprays into the nose daily.     furosemide (LASIX) 40 MG tablet TAKE ONE TABLET BY MOUTH AS NEEDED 30 tablet 1   lisinopril (PRINIVIL,ZESTRIL) 5 MG tablet Take 5 mg by mouth daily.     loratadine (CLARITIN) 10 MG tablet Take 1 tablet by mouth daily.     montelukast (SINGULAIR) 10 MG tablet Take 10 mg by mouth at bedtime.     montelukast (SINGULAIR) 10 MG tablet Take by mouth.     Peppermint Oil (IBGARD) 90 MG CPCR Take by mouth as needed.     polyethylene glycol (MIRALAX / GLYCOLAX) 17 g packet Take 17 g by mouth daily as needed. Pt uses 1/2 tsp as needed  polyethylene glycol powder (GLYCOLAX/MIRALAX) 17 GM/SCOOP powder Take by mouth.     simvastatin (ZOCOR) 20 MG tablet Take 1 tablet by mouth daily.     simvastatin (ZOCOR) 20 MG tablet Take by mouth.     spironolactone (ALDACTONE) 25 MG tablet Take 1 tablet (25 mg total) by mouth at bedtime. 90 tablet 3   No current facility-administered medications for this visit.    Allergies  Allergen Reactions   Penicillins Anaphylaxis    Other reaction(s): Hypotension   Codeine     Upset stomach    Shellfish-Derived Products Nausea And Vomiting    Review of Systems negative except from HPI and PMH  Physical Exam: BP 130/68   Pulse 67   Ht '5\' 7"'$  (1.702 m)   Wt 202 lb (91.6 kg)   SpO2 99%   BMI 31.64 kg/m  Well developed and well nourished in no acute distress HENT normal Neck supple with JVP-flat Lungs Clear Device pocket well healed; without hematoma or erythema.  There is no tethering  Regular rate and rhythm, no  gallop No  murmur Abd-soft  with active BS No Clubbing cyanosis No edema Skin-warm and dry A & Oriented  Grossly normal sensory and motor function  ECG AV pacing with upright QRS V1 and neg QRS lead 1  Assessment and  Plan  Nonischemic cardiomyopathy with interval normalization    6949-lead R/S replaced with a [id number]  Systolic heart failure chronic class 2  High Risk Medication Surveillance  Implantable defibrillator-CRT-Medtronic    Pt heart failure status is stable. Continue furosemide 40 mg prn  Electrolytes are stable.    With resolved cardiomyopathy, will continue coreg, 25 bid, aldactone 25 mg, and lisinopril 5; needs BMET   Given the failed R/S portion of the 6949 lead, would consider removal of the 6949 and the 5076 at change out   I,Stephanie Williams,acting as a scribe for Virl Axe, MD.,have documented all relevant documentation on the behalf of Virl Axe, MD,as directed by  Virl Axe, MD while in the presence of Virl Axe, MD.  I, Virl Axe, MD, have reviewed all documentation for this visit. The documentation on 04/16/21 for the exam, diagnosis, procedures, and orders are all accurate and complete.

## 2021-04-17 LAB — BASIC METABOLIC PANEL
BUN/Creatinine Ratio: 16 (ref 10–24)
BUN: 12 mg/dL (ref 8–27)
CO2: 27 mmol/L (ref 20–29)
Calcium: 9 mg/dL (ref 8.6–10.2)
Chloride: 104 mmol/L (ref 96–106)
Creatinine, Ser: 0.75 mg/dL — ABNORMAL LOW (ref 0.76–1.27)
Glucose: 79 mg/dL (ref 65–99)
Potassium: 3.8 mmol/L (ref 3.5–5.2)
Sodium: 144 mmol/L (ref 134–144)
eGFR: 100 mL/min/{1.73_m2} (ref >59)

## 2021-04-21 ENCOUNTER — Encounter: Payer: Self-pay | Admitting: Podiatry

## 2021-05-20 ENCOUNTER — Ambulatory Visit (INDEPENDENT_AMBULATORY_CARE_PROVIDER_SITE_OTHER): Payer: Medicare PPO

## 2021-05-20 DIAGNOSIS — I5022 Chronic systolic (congestive) heart failure: Secondary | ICD-10-CM

## 2021-05-21 LAB — CUP PACEART REMOTE DEVICE CHECK
Battery Remaining Longevity: 25 mo
Battery Voltage: 2.93 V
Brady Statistic AP VP Percent: 64.22 %
Brady Statistic AP VS Percent: 0.04 %
Brady Statistic AS VP Percent: 35.71 %
Brady Statistic AS VS Percent: 0.02 %
Brady Statistic RA Percent Paced: 64.15 %
Brady Statistic RV Percent Paced: 99.68 %
Date Time Interrogation Session: 20220912044224
HighPow Impedance: 50 Ohm
HighPow Impedance: 71 Ohm
Implantable Lead Implant Date: 20050120
Implantable Lead Implant Date: 20050120
Implantable Lead Implant Date: 20100826
Implantable Lead Location: 753858
Implantable Lead Location: 753859
Implantable Lead Location: 753860
Implantable Lead Model: 4194
Implantable Lead Model: 5076
Implantable Lead Model: 5076
Implantable Pulse Generator Implant Date: 20170106
Lead Channel Impedance Value: 285 Ohm
Lead Channel Impedance Value: 342 Ohm
Lead Channel Impedance Value: 475 Ohm
Lead Channel Impedance Value: 475 Ohm
Lead Channel Impedance Value: 551 Ohm
Lead Channel Impedance Value: 646 Ohm
Lead Channel Pacing Threshold Amplitude: 0.5 V
Lead Channel Pacing Threshold Amplitude: 0.875 V
Lead Channel Pacing Threshold Amplitude: 1.5 V
Lead Channel Pacing Threshold Pulse Width: 0.4 ms
Lead Channel Pacing Threshold Pulse Width: 0.4 ms
Lead Channel Pacing Threshold Pulse Width: 0.4 ms
Lead Channel Sensing Intrinsic Amplitude: 1.875 mV
Lead Channel Sensing Intrinsic Amplitude: 1.875 mV
Lead Channel Sensing Intrinsic Amplitude: 8.75 mV
Lead Channel Sensing Intrinsic Amplitude: 8.75 mV
Lead Channel Setting Pacing Amplitude: 1.75 V
Lead Channel Setting Pacing Amplitude: 2 V
Lead Channel Setting Pacing Amplitude: 2.5 V
Lead Channel Setting Pacing Pulse Width: 0.4 ms
Lead Channel Setting Pacing Pulse Width: 0.4 ms
Lead Channel Setting Sensing Sensitivity: 0.45 mV

## 2021-05-22 ENCOUNTER — Telehealth: Payer: Self-pay | Admitting: Gastroenterology

## 2021-05-22 NOTE — Telephone Encounter (Signed)
Hey Dr. Silverio Decamp,   We received a call from patient to schedule his recall colon with you. He saw a GI provider at [facility] (07/2020). Records are in Epic. Could you please review and advise on scheduling recall colon?  Thank you

## 2021-05-22 NOTE — Telephone Encounter (Signed)
He should contact his GI at [facility] to schedule surveillance colonoscopy based on chart review he is due for colonoscopy. Thanks

## 2021-05-28 NOTE — Progress Notes (Signed)
Remote ICD transmission.   

## 2021-05-30 NOTE — Telephone Encounter (Signed)
Spoke with patient. Advised of recommendation from Dr. Silverio Decamp. Patient expressed understanding.

## 2021-06-27 IMAGING — CR DG CHEST 2V
2 series · 2 of 2 positions shown · non-contrast
Comparison: Chest x-ray 05/13/2015.

CLINICAL DATA: 63-year-old male with history of palpable sternal
notch.

EXAM:
CHEST - 2 VIEW

[w chest pa]
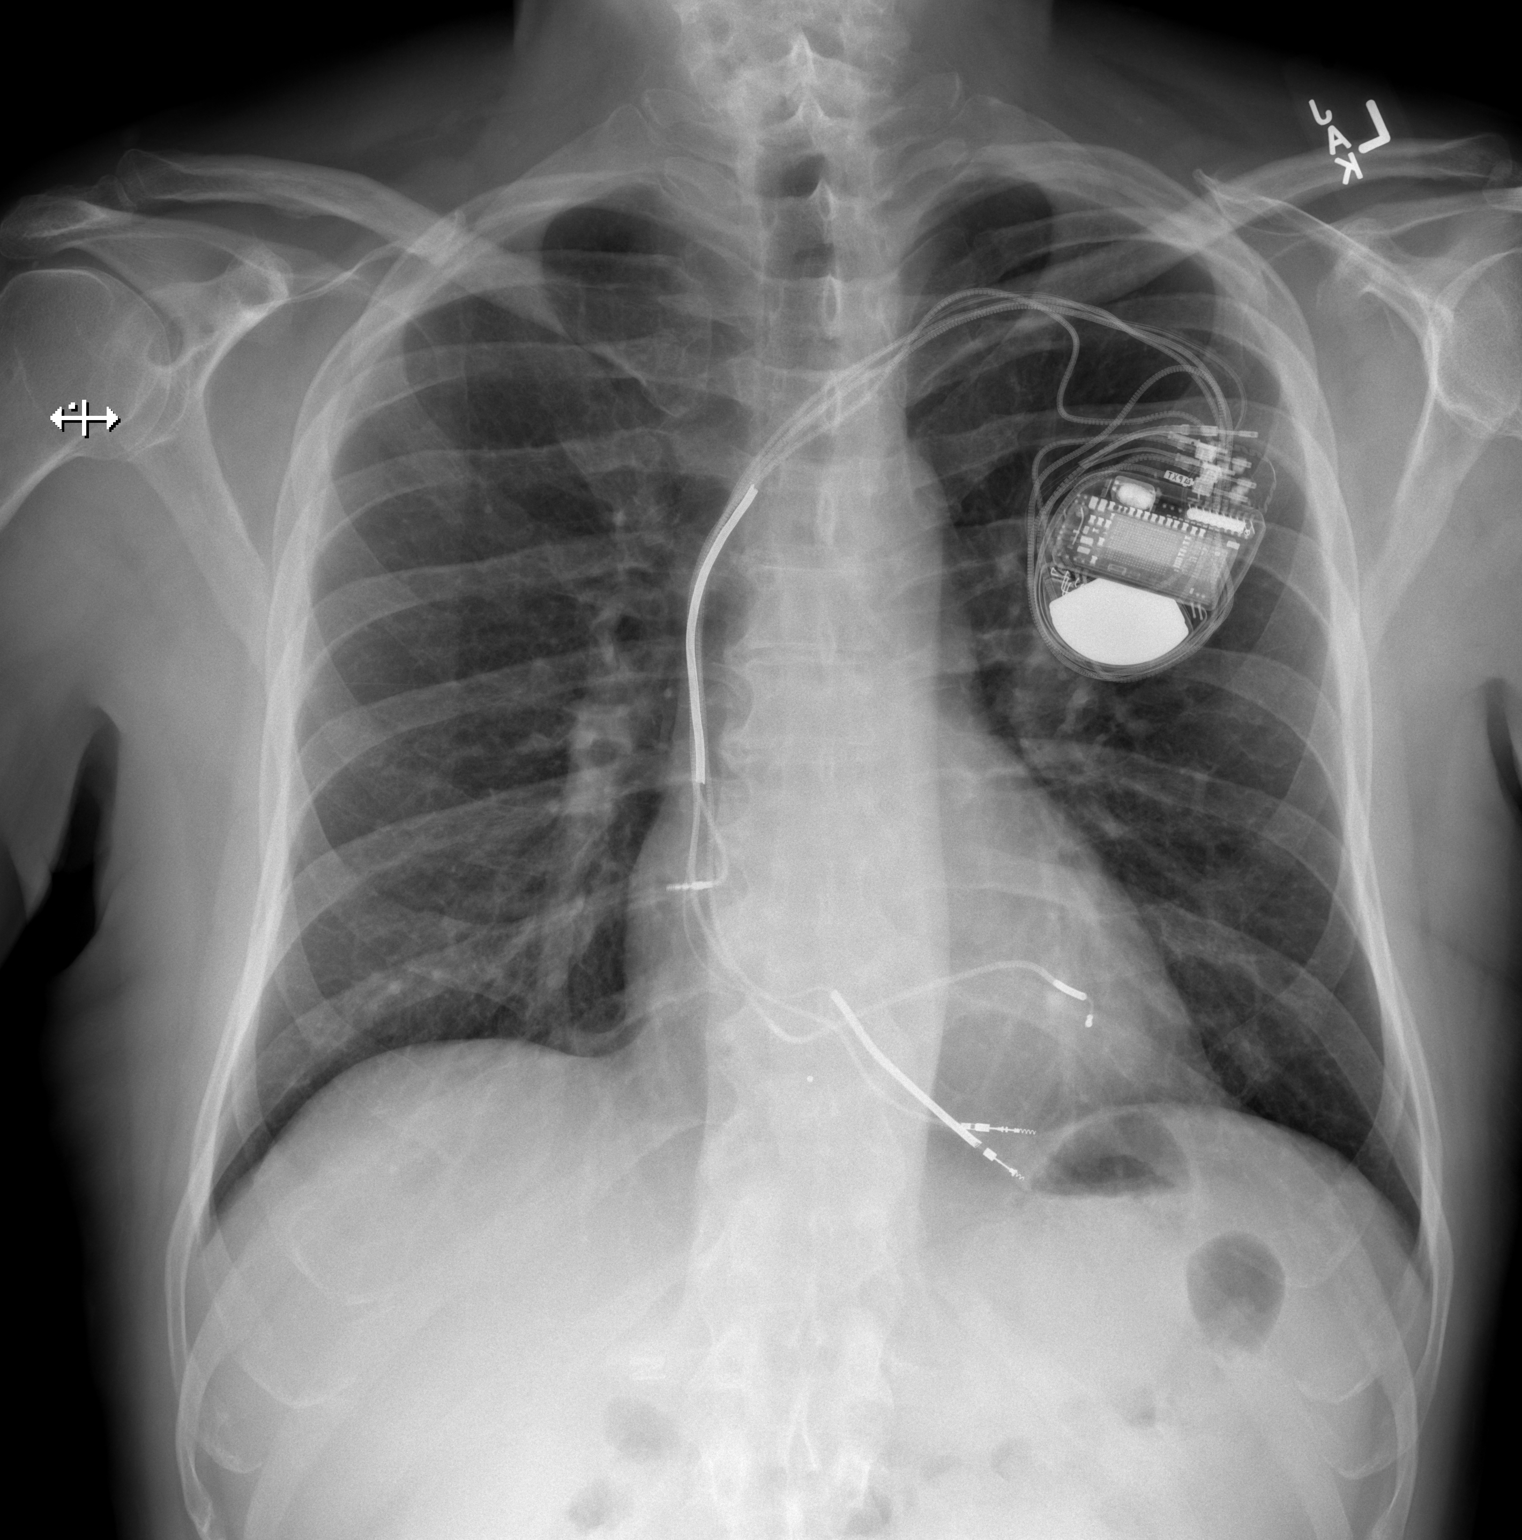

[w chest lat]
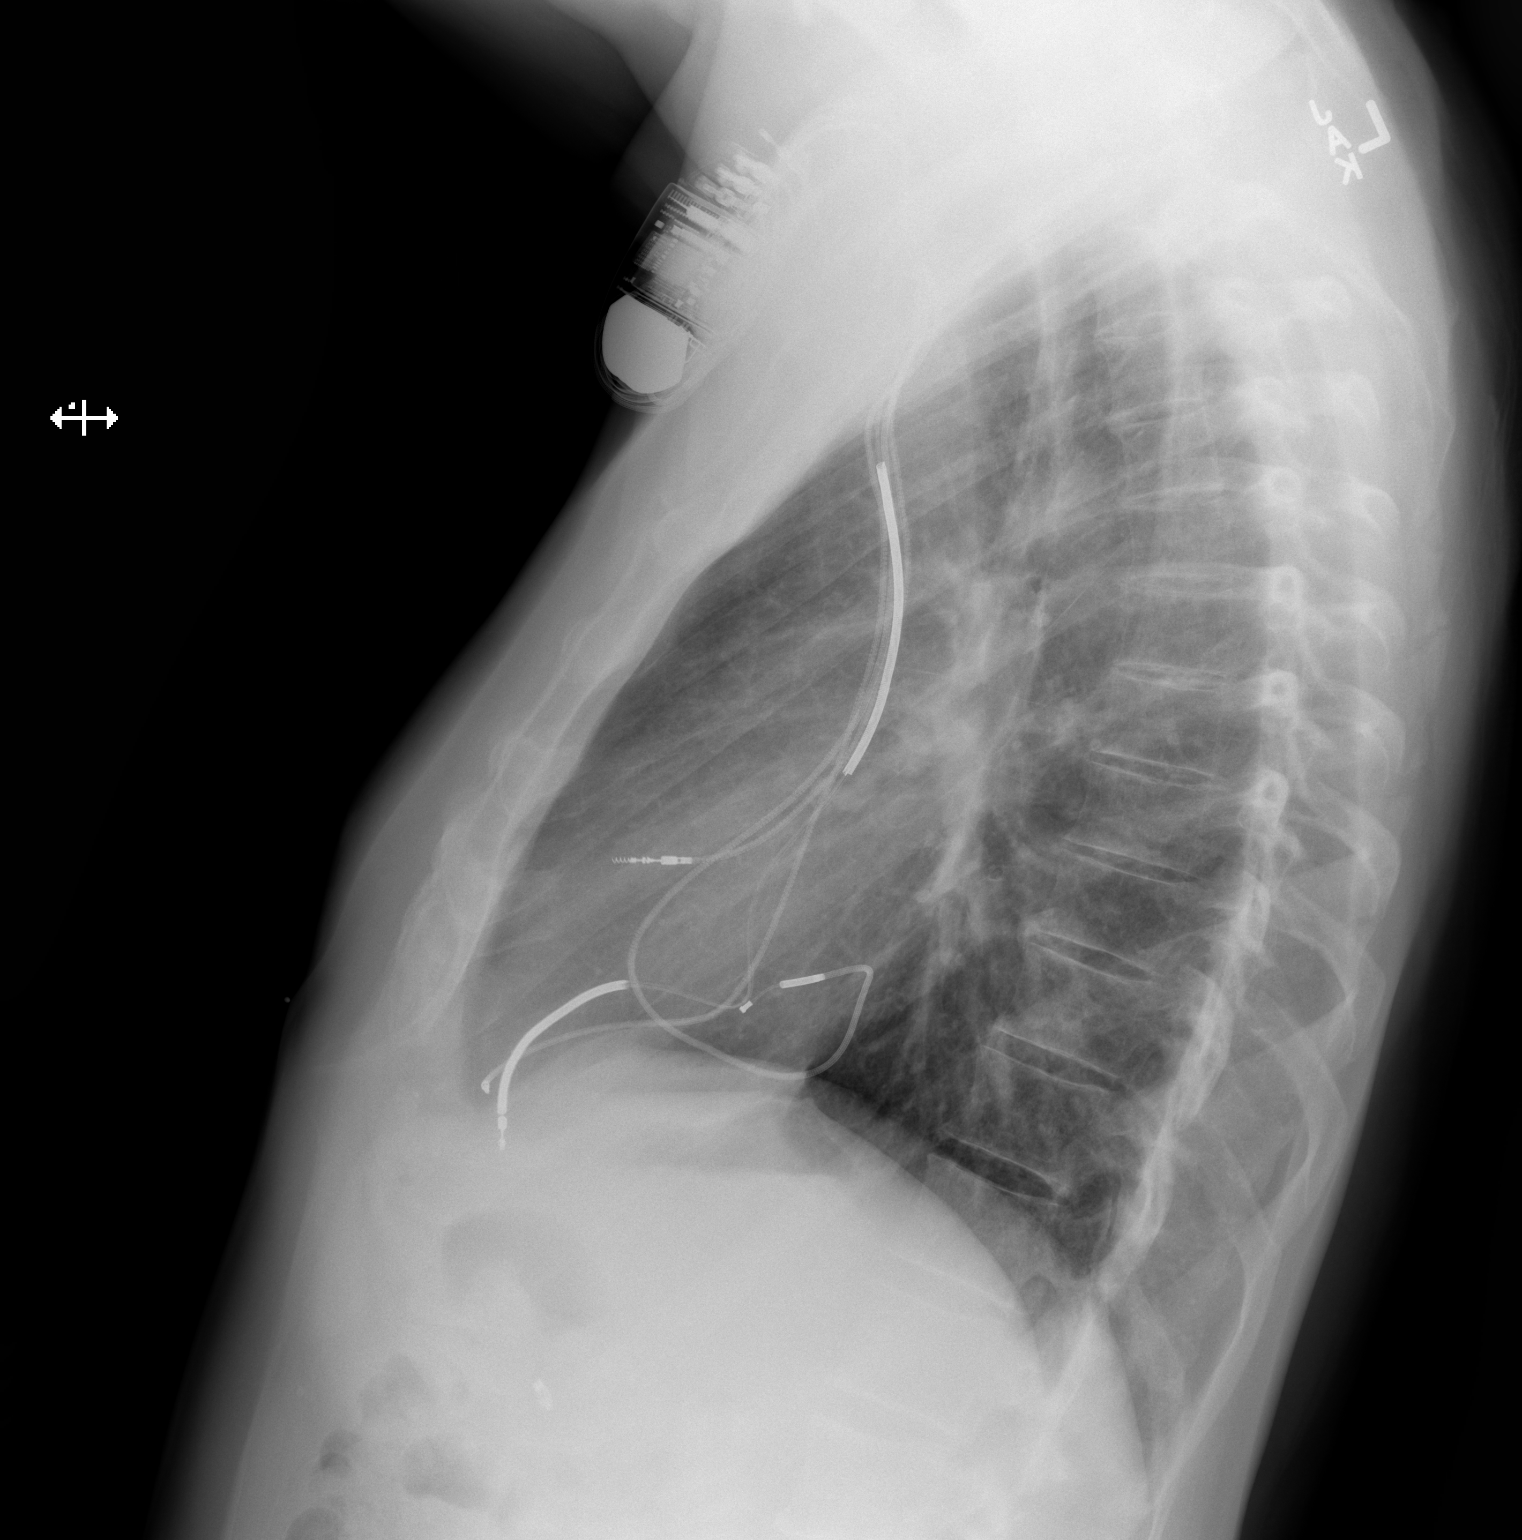

[2 of 2 positions shown; findings below may reference images not displayed]

FINDINGS: Left-sided biventricular pacemaker/AICD noted with lead tips
projecting over the expected location of the right atrium, right
ventricle and overlying the lateral wall of the left ventricle via
the coronary sinus and coronary veins. Lung volumes are normal. No
consolidative airspace disease. No pleural effusions. No
pneumothorax. No pulmonary nodule or mass noted. Pulmonary
vasculature and the cardiomediastinal silhouette are within normal
limits. On the lateral projection, no definite soft tissue mass
appreciated anterior to the sternum, and no osseous lesion of the
sternum.
IMPRESSION: 1. No radiographic evidence of acute cardiopulmonary disease. No
finding to account for the patient's palpable sternal lesion.
2.

## 2021-08-19 ENCOUNTER — Ambulatory Visit (INDEPENDENT_AMBULATORY_CARE_PROVIDER_SITE_OTHER): Payer: Medicare PPO

## 2021-08-19 DIAGNOSIS — I5022 Chronic systolic (congestive) heart failure: Secondary | ICD-10-CM | POA: Diagnosis not present

## 2021-08-20 LAB — CUP PACEART REMOTE DEVICE CHECK
Battery Remaining Longevity: 25 mo
Battery Voltage: 2.92 V
Brady Statistic AP VP Percent: 65.83 %
Brady Statistic AP VS Percent: 0.05 %
Brady Statistic AS VP Percent: 34.09 %
Brady Statistic AS VS Percent: 0.03 %
Brady Statistic RA Percent Paced: 65.55 %
Brady Statistic RV Percent Paced: 99.39 %
Date Time Interrogation Session: 20221212031608
HighPow Impedance: 52 Ohm
HighPow Impedance: 77 Ohm
Implantable Lead Implant Date: 20050120
Implantable Lead Implant Date: 20050120
Implantable Lead Implant Date: 20100826
Implantable Lead Location: 753858
Implantable Lead Location: 753859
Implantable Lead Location: 753860
Implantable Lead Model: 4194
Implantable Lead Model: 5076
Implantable Lead Model: 5076
Implantable Pulse Generator Implant Date: 20170106
Lead Channel Impedance Value: 285 Ohm
Lead Channel Impedance Value: 418 Ohm
Lead Channel Impedance Value: 475 Ohm
Lead Channel Impedance Value: 551 Ohm
Lead Channel Impedance Value: 608 Ohm
Lead Channel Impedance Value: 722 Ohm
Lead Channel Pacing Threshold Amplitude: 0.5 V
Lead Channel Pacing Threshold Amplitude: 0.875 V
Lead Channel Pacing Threshold Amplitude: 1 V
Lead Channel Pacing Threshold Pulse Width: 0.4 ms
Lead Channel Pacing Threshold Pulse Width: 0.4 ms
Lead Channel Pacing Threshold Pulse Width: 0.4 ms
Lead Channel Sensing Intrinsic Amplitude: 10.125 mV
Lead Channel Sensing Intrinsic Amplitude: 10.125 mV
Lead Channel Sensing Intrinsic Amplitude: 3.25 mV
Lead Channel Sensing Intrinsic Amplitude: 3.25 mV
Lead Channel Setting Pacing Amplitude: 1.75 V
Lead Channel Setting Pacing Amplitude: 2 V
Lead Channel Setting Pacing Amplitude: 2 V
Lead Channel Setting Pacing Pulse Width: 0.4 ms
Lead Channel Setting Pacing Pulse Width: 0.4 ms
Lead Channel Setting Sensing Sensitivity: 0.45 mV

## 2021-08-22 NOTE — Progress Notes (Signed)
Jonathon Avila Date of Birth: 1955/12/17   History of Present Illness: Jonathon Avila is seen  today for followup of CHF. 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 He is status post biventricular ICD. This did result in significant improvement in LV function. Echo was last check in March 2018 and was unchanged from 2014 with EF 45-50%. He underwent a generator change out of his ICD on 04/14/75 without complications. Last ICD check in December  was satisfactory. We did update an Echo recently and it was unchanged with EF 50%.   On follow up today he is doing very well. He was seen by IBS clinic in [place] for his chronic constipation. States he really didn't have IBS but does have slow colonic transit time. Now using metamucil.    Denies any chest pain, dyspnea, palpitations. Only gets SOB in cold air. No edema.  ICD check on 08/19/21 was normal.    Current Outpatient Medications on File Prior to Visit  Medication Sig Dispense Refill   aspirin EC 81 MG tablet Take 1 tablet (81 mg total) by mouth daily. 90 tablet 3   carvedilol (COREG) 25 MG tablet Take 1 tablet (25 mg total) by mouth 2 (two) times daily with a meal. 180 tablet 3   cholecalciferol (VITAMIN D) 1000 UNITS tablet Take 2,000 Units by mouth 2 (two) times daily.     docusate sodium (COLACE) 100 MG capsule Take 1-2 capsules by mouth 2 (two) times daily as needed for constipation.     fluticasone (FLONASE) 50 MCG/ACT nasal spray Place 2 sprays into the nose daily.     furosemide (LASIX) 40 MG tablet TAKE ONE TABLET BY MOUTH AS NEEDED 30 tablet 1   lisinopril (ZESTRIL) 10 MG tablet Take 10 mg by mouth daily.     loratadine (CLARITIN) 10 MG tablet Take 1 tablet by mouth daily.     montelukast (SINGULAIR) 10 MG tablet Take 10 mg by mouth at bedtime.     Multiple Vitamins-Minerals (MENS 50+ MULTI VITAMIN/MIN PO) Take by mouth daily.     Peppermint Oil (IBGARD) 90 MG CPCR Take by mouth as  needed.     polyethylene glycol (MIRALAX / GLYCOLAX) 17 g packet Take 17 g by mouth daily as needed. Pt uses 1/2 tsp as needed     simvastatin (ZOCOR) 20 MG tablet Take 1 tablet by mouth daily.     spironolactone (ALDACTONE) 25 MG tablet Take 1 tablet (25 mg total) by mouth at bedtime. 90 tablet 3   No current facility-administered medications on file prior to visit.    Allergies  Allergen Reactions   Penicillins Anaphylaxis    Other reaction(s): Hypotension   Codeine     Upset stomach    Shellfish-Derived Products Nausea And Vomiting    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    with pacemaker, CRT   LBBB (left bundle branch block)    LV dysfunction    Mitral insufficiency    Nonischemic dilated cardiomyopathy ([place])    Pulmonary hypertension ([place])     Past Surgical History:  Procedure Laterality Date   CARDIAC  CATHETERIZATION  10/25/2001   EF 65%   CHOLECYSTECTOMY     COLONOSCOPY     EP IMPLANTABLE DEVICE N/A 09/14/2015   Procedure:  ICD Generator Changeout;  Surgeon: Deboraha Sprang, MD;  Location: [facility];  Service: Cardiovascular;  Laterality: N/A;   ICD     BIVENTRICULAR   POLYPECTOMY     US ECHOCARDIOGRAPHY  02/19/2009   EF 45%   wisdom teeth extraxtion      Social History   Tobacco Use  Smoking Status Never  Smokeless Tobacco Never    Social History   Substance and Sexual Activity  Alcohol Use No    Family History  Problem Relation Age of Onset   COPD Mother        deceased   Colon cancer Father        deceased age 39   Prostate cancer Maternal Grandmother    Esophageal cancer Neg Hx    Pancreatic cancer Neg Hx    Rectal cancer Neg Hx     Stomach cancer Neg Hx    Colon polyps Neg Hx     Review of Systems: As noted in history of present illness.  All other systems were reviewed and are negative.  Physical Exam: BP 118/62 (BP Location: Left Arm)    Pulse 70    Ht 5\' 7"  (1.702 m)    Wt 196 lb 6.4 oz (89.1 kg)    SpO2 99%    BMI 30.76 kg/m  GENERAL:  Well appearing WM in NAD HEENT:  PERRL, EOMI, sclera are clear. Oropharynx is clear. NECK:  No jugular venous distention, carotid upstroke brisk and symmetric, no bruits, no thyromegaly or adenopathy LUNGS:  Clear to auscultation bilaterally CHEST:  Unremarkable, ICD in left upper chest HEART:  RRR,  PMI not displaced or sustained,S1 and S2 within normal limits, no S3, no S4: no clicks, no rubs, no murmurs ABD:  Soft, nontender. BS +, no masses or bruits. No hepatomegaly, no splenomegaly EXT:  2 + pulses throughout, no edema, no cyanosis no clubbing SKIN:  Warm and dry.  No rashes NEURO:  Alert and oriented x 3. Cranial nerves II through XII intact. PSYCH:  Cognitively intact  LABORATORY DATA:  Lab Results  Component Value Date   WBC 11.2 (H) 09/24/2018   HGB 14.2 09/24/2018   HCT 41.3 09/24/2018   PLT 231 09/24/2018   GLUCOSE 79 04/16/2021   CHOL  10/21/2009    112        ATP III CLASSIFICATION:  <200     mg/dL   Desirable  200-239  mg/dL   Borderline High  >=240    mg/dL   High          TRIG 129 10/21/2009   HDL 34 (L) 10/21/2009   LDLCALC  10/21/2009    52        Total Cholesterol/HDL:CHD Risk Coronary Heart Disease Risk Table                     Men   Women  1/2 Average Risk   3.4   3.3  Average Risk       5.0   4.4  2 X Average Risk   9.6   7.1  3 X Average Risk  23.4   11.0        Use the calculated Patient Ratio above and the CHD Risk Table to determine the patient's CHD Risk.  ATP III CLASSIFICATION (LDL):  <100     mg/dL   Optimal  100-129  mg/dL   Near or Above                    Optimal  130-159  mg/dL   Borderline  160-189   mg/dL   High  >190     mg/dL   Very High   ALT 23 05/13/2015   AST 22 05/13/2015   NA 144 04/16/2021   K 3.8 04/16/2021   CL 104 04/16/2021   CREATININE 0.75 (L) 04/16/2021   BUN 12 04/16/2021   CO2 27 04/16/2021   TSH 0.63 06/16/2019   INR 1.1 ratio (H) 04/26/2009   HGBA1C 5.8 06/24/2019   Labs reviewed from primary care 04/03/16: CMET and CBC normal. Cholesterol 116, trig- 132, HDL 31, LDL 59. BNP 14.1. Jul 04, 2016: A1c 5.7%. Oct 07, 2016 A1c 5.6%.  Dated 04/15/17: glucose 115. A1c 5.6%. Otherwise CMET and CBC normal. Cholesterol 120, triglycerides 109, HDL 35, LDL 63.  Dated 10/05/17: glucose 152, otherwise CBC and CMET normal. Cholesterol 108, triglycerides 152, HDL 32, LDL 46 Dated 04/14/18: cholesterol 116, triglycerides 114, HDL 32, LDL 61. CMET normal.  Dated 06/10/19: normal CBC and CMET.  Dated 01/26/20: cholesterol 110, triglycerides 57, HDL 57, LDL 42.  CBC, CMET, Vit D normal.  Dated 01/02/21: cholesterol 126, triglycerides 47, HDL 56, LDL 59.  Dated 06/26/21: normal CMET and CBC.    Echo 11/14/16: Study Conclusions   - Left ventricle: The cavity size was normal. Wall thickness was   increased in a pattern of mild LVH. Systolic function was mildly   reduced. The estimated ejection fraction was in the range of 45%   to 50%. Incoordinate septal motion. Doppler parameters are   consistent with abnormal left ventricular relaxation (grade 1   diastolic dysfunction). The E/e&' ratio is between 8-15,   suggesting indeterminate LV filling pressure. - Left atrium: The atrium was normal in size. - Right ventricle: The cavity size was normal. Wall thickness was   normal. Pacer wire or catheter noted in right ventricle. Systolic   function was normal. - Right atrium: The atrium was mildly dilated. Pacer wire or   catheter noted in right atrium. - Atrial septum: No defect or patent foramen ovale was identified. - Tricuspid valve: There was mild regurgitation. - Pulmonary arteries: PA  peak pressure: 27 mm Hg (S). - Inferior vena cava: The vessel was normal in size. The   respirophasic diameter changes were in the normal range (>= 50%),   consistent with normal central venous pressure.   Impressions:   - Compared to a previous study in 2014, there has been no change.  Echo 08/23/20: IMPRESSIONS     1. Left ventricular ejection fraction, by estimation, is 50%. The left  ventricle has mildly decreased function. The left ventricle demonstrates  global hypokinesis. Left ventricular diastolic parameters are consistent  with Grade I diastolic dysfunction  (impaired relaxation).   2. Right ventricular systolic function is normal. The right ventricular  size is normal.   3. Left atrial size was moderately dilated.   4. The mitral valve is grossly normal. Trivial mitral valve  regurgitation.   5. The aortic valve is tricuspid. There is mild calcification of the  aortic valve. Aortic valve regurgitation is not visualized. No aortic  stenosis is present.   Comparison(s): A prior study was performed on 11/14/2016. Similar LV  function from prior.  Assessment / Plan: 1. Chronic systolic congestive heart failure. Last Ejection fraction of 50% in 2021 - this is unchanged. He has class 1 symptoms. He is on good medical therapy with an ACE inhibitor, carvedilol, and Aldactone.  He has a biventricular pacemaker in place.   2. Status post ICD/biventricular pacemaker. Continue followup in EP clinic. S/p generator change out Jan 2017. ICD check in Dec was good.    3. Obesity.. At ideal weight. Encourage continued dietary modification.  4. Left bundle branch block.  Follow up in one year

## 2021-08-26 ENCOUNTER — Other Ambulatory Visit: Payer: Self-pay

## 2021-08-26 ENCOUNTER — Encounter: Payer: Self-pay | Admitting: Cardiology

## 2021-08-26 ENCOUNTER — Ambulatory Visit: Payer: Medicare PPO | Admitting: Cardiology

## 2021-08-26 VITALS — BP 118/62 | HR 70 | Ht 67.0 in | Wt 196.4 lb

## 2021-08-26 DIAGNOSIS — Z95810 Presence of automatic (implantable) cardiac defibrillator: Secondary | ICD-10-CM | POA: Diagnosis not present

## 2021-08-26 DIAGNOSIS — I447 Left bundle-branch block, unspecified: Secondary | ICD-10-CM | POA: Diagnosis not present

## 2021-08-26 DIAGNOSIS — I5022 Chronic systolic (congestive) heart failure: Secondary | ICD-10-CM

## 2021-08-26 DIAGNOSIS — E7800 Pure hypercholesterolemia, unspecified: Secondary | ICD-10-CM

## 2021-08-26 DIAGNOSIS — I428 Other cardiomyopathies: Secondary | ICD-10-CM | POA: Diagnosis not present

## 2021-08-28 NOTE — Progress Notes (Signed)
Remote ICD transmission.   

## 2021-10-05 ENCOUNTER — Other Ambulatory Visit: Payer: Self-pay | Admitting: Cardiology

## 2021-11-18 ENCOUNTER — Ambulatory Visit (INDEPENDENT_AMBULATORY_CARE_PROVIDER_SITE_OTHER): Payer: Medicare PPO

## 2021-11-18 DIAGNOSIS — I428 Other cardiomyopathies: Secondary | ICD-10-CM | POA: Diagnosis not present

## 2021-11-19 LAB — CUP PACEART REMOTE DEVICE CHECK
Battery Remaining Longevity: 22 mo
Battery Voltage: 2.91 V
Brady Statistic AP VP Percent: 74.06 %
Brady Statistic AP VS Percent: 0.05 %
Brady Statistic AS VP Percent: 25.88 %
Brady Statistic AS VS Percent: 0.02 %
Brady Statistic RA Percent Paced: 73.94 %
Brady Statistic RV Percent Paced: 99.61 %
Date Time Interrogation Session: 20230313033421
HighPow Impedance: 49 Ohm
HighPow Impedance: 73 Ohm
Implantable Lead Implant Date: 20050120
Implantable Lead Implant Date: 20050120
Implantable Lead Implant Date: 20100826
Implantable Lead Location: 753858
Implantable Lead Location: 753859
Implantable Lead Location: 753860
Implantable Lead Model: 4194
Implantable Lead Model: 5076
Implantable Lead Model: 5076
Implantable Pulse Generator Implant Date: 20170106
Lead Channel Impedance Value: 285 Ohm
Lead Channel Impedance Value: 361 Ohm
Lead Channel Impedance Value: 456 Ohm
Lead Channel Impedance Value: 475 Ohm
Lead Channel Impedance Value: 551 Ohm
Lead Channel Impedance Value: 665 Ohm
Lead Channel Pacing Threshold Amplitude: 0.625 V
Lead Channel Pacing Threshold Amplitude: 1 V
Lead Channel Pacing Threshold Amplitude: 1.5 V
Lead Channel Pacing Threshold Pulse Width: 0.4 ms
Lead Channel Pacing Threshold Pulse Width: 0.4 ms
Lead Channel Pacing Threshold Pulse Width: 0.4 ms
Lead Channel Sensing Intrinsic Amplitude: 17 mV
Lead Channel Sensing Intrinsic Amplitude: 17 mV
Lead Channel Sensing Intrinsic Amplitude: 2.25 mV
Lead Channel Sensing Intrinsic Amplitude: 2.25 mV
Lead Channel Setting Pacing Amplitude: 2 V
Lead Channel Setting Pacing Amplitude: 2 V
Lead Channel Setting Pacing Amplitude: 2.5 V
Lead Channel Setting Pacing Pulse Width: 0.4 ms
Lead Channel Setting Pacing Pulse Width: 0.4 ms
Lead Channel Setting Sensing Sensitivity: 0.45 mV

## 2021-12-03 NOTE — Progress Notes (Signed)
Remote ICD transmission.   

## 2022-02-20 ENCOUNTER — Telehealth: Payer: Self-pay

## 2022-02-20 NOTE — Telephone Encounter (Signed)
Mr Hamblen called to get help with his monitor. I called tech support for additional help. The patient is receiving a new handheld in 7-10 business days.

## 2022-02-28 ENCOUNTER — Ambulatory Visit (INDEPENDENT_AMBULATORY_CARE_PROVIDER_SITE_OTHER): Payer: Medicare PPO

## 2022-02-28 DIAGNOSIS — I428 Other cardiomyopathies: Secondary | ICD-10-CM

## 2022-03-04 LAB — CUP PACEART REMOTE DEVICE CHECK
Battery Remaining Longevity: 20 mo
Battery Voltage: 2.85 V
Brady Statistic AP VP Percent: 63.5 %
Brady Statistic AP VS Percent: 0.03 %
Brady Statistic AS VP Percent: 36.46 %
Brady Statistic AS VS Percent: 0.01 %
Brady Statistic RA Percent Paced: 63.51 %
Brady Statistic RV Percent Paced: 99.91 %
Date Time Interrogation Session: 20230622171132
HighPow Impedance: 58 Ohm
HighPow Impedance: 85 Ohm
Implantable Lead Implant Date: 20050120
Implantable Lead Implant Date: 20050120
Implantable Lead Implant Date: 20100826
Implantable Lead Location: 753858
Implantable Lead Location: 753859
Implantable Lead Location: 753860
Implantable Lead Model: 4194
Implantable Lead Model: 5076
Implantable Lead Model: 5076
Implantable Pulse Generator Implant Date: 20170106
Lead Channel Impedance Value: 285 Ohm
Lead Channel Impedance Value: 418 Ohm
Lead Channel Impedance Value: 513 Ohm
Lead Channel Impedance Value: 608 Ohm
Lead Channel Impedance Value: 608 Ohm
Lead Channel Impedance Value: 779 Ohm
Lead Channel Pacing Threshold Amplitude: 0.625 V
Lead Channel Pacing Threshold Amplitude: 0.875 V
Lead Channel Pacing Threshold Amplitude: 0.875 V
Lead Channel Pacing Threshold Pulse Width: 0.4 ms
Lead Channel Pacing Threshold Pulse Width: 0.4 ms
Lead Channel Pacing Threshold Pulse Width: 0.4 ms
Lead Channel Sensing Intrinsic Amplitude: 18.5 mV
Lead Channel Sensing Intrinsic Amplitude: 18.5 mV
Lead Channel Sensing Intrinsic Amplitude: 3.125 mV
Lead Channel Sensing Intrinsic Amplitude: 3.125 mV
Lead Channel Setting Pacing Amplitude: 2 V
Lead Channel Setting Pacing Amplitude: 2 V
Lead Channel Setting Pacing Amplitude: 2.25 V
Lead Channel Setting Pacing Pulse Width: 0.4 ms
Lead Channel Setting Pacing Pulse Width: 0.4 ms
Lead Channel Setting Sensing Sensitivity: 0.45 mV

## 2022-03-05 NOTE — Progress Notes (Signed)
Remote ICD transmission.   

## 2022-03-12 ENCOUNTER — Ambulatory Visit (INDEPENDENT_AMBULATORY_CARE_PROVIDER_SITE_OTHER): Payer: Medicare PPO | Admitting: Internal Medicine

## 2022-03-12 ENCOUNTER — Encounter: Payer: Self-pay | Admitting: Internal Medicine

## 2022-03-12 VITALS — BP 110/58 | HR 82 | Ht 67.0 in | Wt 186.4 lb

## 2022-03-12 DIAGNOSIS — Z95810 Presence of automatic (implantable) cardiac defibrillator: Secondary | ICD-10-CM | POA: Diagnosis not present

## 2022-03-12 DIAGNOSIS — I428 Other cardiomyopathies: Secondary | ICD-10-CM

## 2022-03-12 DIAGNOSIS — I5022 Chronic systolic (congestive) heart failure: Secondary | ICD-10-CM

## 2022-03-12 NOTE — Patient Instructions (Signed)

## 2022-03-12 NOTE — Progress Notes (Signed)
Patient ID: Jonathon Avila, male   DOB: 26-Mar-1956, 66 y.o.   MRN: [id number]   HPI  Jonathon Avila is a 66 y.o. male seen in followup for a CRT-D. implanted for congestive heart failure in the setting of nonischemic heart disease He had a 6949 lead, the RS-portion has been replaced with 5076; generator replacement 1/17.     The patient denies chest pain, shortness of breath, nocturnal dyspnea, orthopnea or peripheral edema.  There have been no palpitations, lightheadedness or syncope.   [name] PLAYING golf and enjoying it  DATE TEST EF%   10/14  Echo    45-50 %   3/18  Echo  45-50 %   12/21 Echo   50%    Date Cr K Hgb  5/17  0.7 4.2    4/19  0.97 4.2    1/20 0.82 4.5 14.2  1/21 0.73 4.1   4/22(CE) 0.81 4.0 15.7 (11/22)  6/23 (CE)  0.86 3.9<<3.4     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    with pacemaker, CRT   LBBB (left bundle branch block)    LV dysfunction    Mitral insufficiency    Nonischemic dilated cardiomyopathy ([place])    Pulmonary hypertension ([place])     Past Surgical History:  Procedure Laterality Date   CARDIAC CATHETERIZATION  10/25/2001   EF 65%   CHOLECYSTECTOMY     COLONOSCOPY     EP IMPLANTABLE DEVICE N/A 09/14/2015   Procedure:  ICD Generator Changeout;  Surgeon: Deboraha Sprang, MD;  Location: [facility];  Service: Cardiovascular;  Laterality: N/A;   ICD     BIVENTRICULAR   POLYPECTOMY     US ECHOCARDIOGRAPHY  02/19/2009   EF 45%   wisdom teeth extraxtion      Current Outpatient Medications  Medication Sig Dispense Refill   aspirin EC 81 MG tablet Take 1 tablet (81 mg total) by mouth daily. 90 tablet 3   carvedilol (COREG) 25 MG  tablet TAKE 1 TABLET TWICE DAILY WITH A MEAL 180 tablet 3   cholecalciferol (VITAMIN D) 1000 UNITS tablet Take 2,000 Units by mouth 2 (two) times daily.     docusate sodium (COLACE) 100 MG capsule Take 1-2 capsules by mouth 2 (two) times daily as needed for constipation.     fluticasone (FLONASE) 50 MCG/ACT nasal spray Place 2 sprays into the nose daily.     furosemide (LASIX) 40 MG tablet TAKE ONE TABLET BY MOUTH AS NEEDED 30 tablet 1   lisinopril (ZESTRIL) 5 MG tablet Take 5 mg by mouth daily.     loratadine (CLARITIN) 10 MG tablet Take 1 tablet by mouth daily.     montelukast (SINGULAIR) 10 MG tablet Take 10 mg by mouth at bedtime.     Multiple Vitamins-Minerals (MENS 50+ MULTI VITAMIN/MIN PO) Take by mouth daily.     Peppermint Oil (IBGARD) 90 MG CPCR Take by mouth as needed.     polyethylene glycol (MIRALAX / GLYCOLAX) 17 g packet Take 17 g by mouth daily as needed. Pt uses 1/2 tsp as needed     simvastatin (ZOCOR) 20 MG tablet  Take 1 tablet by mouth daily.     spironolactone (ALDACTONE) 25 MG tablet TAKE 1 TABLET AT BEDTIME 90 tablet 3   No current facility-administered medications for this visit.    Allergies  Allergen Reactions   Penicillins Anaphylaxis    Other reaction(s): Hypotension   Codeine     Upset stomach    Shellfish-Derived Products Nausea And Vomiting    Review of Systems negative except from HPI and PMH  Physical Exam: BP (!) 110/58   Pulse 82   Ht '5\' 7"'$  (1.702 m)   Wt 186 lb 6.4 oz (84.6 kg)   SpO2 99%   BMI 29.19 kg/m  Well developed and well nourished in no acute distress HENT normal Neck supple with JVP-flat Clear Device pocket well healed; without hematoma or erythema.  There is no tethering  Regular rate and rhythm, no  murmur Abd-soft with active BS No Clubbing cyanosis  edema Skin-warm and dry A & Oriented  Grossly normal sensory and motor function  ECG AV pacing upright QRS lead V1 and neg lead 1   Assessment and  Plan  Nonischemic  cardiomyopathy with interval normalization    6949-lead R/S replaced with a 7948  Systolic heart failure chronic class 2   Pt heart failure status is stable. Continue 40 mg as needed   Continue Guideline directed medical therapy with lisinopril carvedilol 25 bid and aldactone 25  Might be a candidate for SGLT will ask Dr Shirlee More

## 2022-05-30 ENCOUNTER — Ambulatory Visit (INDEPENDENT_AMBULATORY_CARE_PROVIDER_SITE_OTHER): Payer: Medicare PPO

## 2022-05-30 DIAGNOSIS — I428 Other cardiomyopathies: Secondary | ICD-10-CM

## 2022-06-02 LAB — CUP PACEART REMOTE DEVICE CHECK
Battery Remaining Longevity: 17 mo
Battery Voltage: 2.84 V
Brady Statistic AP VP Percent: 70.15 %
Brady Statistic AP VS Percent: 0.04 %
Brady Statistic AS VP Percent: 29.8 %
Brady Statistic AS VS Percent: 0.01 %
Brady Statistic RA Percent Paced: 70.18 %
Brady Statistic RV Percent Paced: 99.91 %
Date Time Interrogation Session: 20230923072403
HighPow Impedance: 55 Ohm
HighPow Impedance: 80 Ohm
Implantable Lead Implant Date: 20050120
Implantable Lead Implant Date: 20050120
Implantable Lead Implant Date: 20100826
Implantable Lead Location: 753858
Implantable Lead Location: 753859
Implantable Lead Location: 753860
Implantable Lead Model: 4194
Implantable Lead Model: 5076
Implantable Lead Model: 5076
Implantable Pulse Generator Implant Date: 20170106
Lead Channel Impedance Value: 304 Ohm
Lead Channel Impedance Value: 418 Ohm
Lead Channel Impedance Value: 475 Ohm
Lead Channel Impedance Value: 589 Ohm
Lead Channel Impedance Value: 608 Ohm
Lead Channel Impedance Value: 779 Ohm
Lead Channel Pacing Threshold Amplitude: 0.625 V
Lead Channel Pacing Threshold Amplitude: 0.875 V
Lead Channel Pacing Threshold Amplitude: 1 V
Lead Channel Pacing Threshold Pulse Width: 0.4 ms
Lead Channel Pacing Threshold Pulse Width: 0.4 ms
Lead Channel Pacing Threshold Pulse Width: 0.4 ms
Lead Channel Sensing Intrinsic Amplitude: 16.375 mV
Lead Channel Sensing Intrinsic Amplitude: 16.375 mV
Lead Channel Sensing Intrinsic Amplitude: 3.5 mV
Lead Channel Sensing Intrinsic Amplitude: 3.5 mV
Lead Channel Setting Pacing Amplitude: 1.75 V
Lead Channel Setting Pacing Amplitude: 2 V
Lead Channel Setting Pacing Amplitude: 2 V
Lead Channel Setting Pacing Pulse Width: 0.4 ms
Lead Channel Setting Pacing Pulse Width: 0.4 ms
Lead Channel Setting Sensing Sensitivity: 0.45 mV

## 2022-06-06 NOTE — Progress Notes (Signed)
Remote ICD transmission.   

## 2022-06-11 ENCOUNTER — Telehealth: Payer: Self-pay | Admitting: Cardiology

## 2022-06-11 NOTE — Telephone Encounter (Signed)
  Pt c/o medication issue:  1. Name of Medication: Moviprep  2. How are you currently taking this medication (dosage and times per day)? Has not started  3. Are you having a reaction (difficulty breathing--STAT)? no  4. What is your medication issue? Patient states he is supposed to take the medication at 4pm today for his colonoscopy tomorrow. He would like to know if it is safe for him to take.

## 2022-06-11 NOTE — Telephone Encounter (Signed)
Patient should take medications as directed by his GI physician otherwise his colonoscopy will be canceled

## 2022-07-18 ENCOUNTER — Encounter: Payer: Self-pay | Admitting: Cardiology

## 2022-08-21 NOTE — Progress Notes (Deleted)
Jonathon Avila Date of Birth: 27-Feb-1956   History of Present Illness: Jonathon Avila is seen  today for followup of CHF. 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 He is status post biventricular ICD. This did result in significant improvement in LV function. Echo was last check in March 2018 and was unchanged from 2014 with EF 45-50%. He underwent a generator change out of his ICD on 10/12/51 without complications. Last ICD check in December  was satisfactory. We did update an Echo recently and it was unchanged with EF 50%.   On follow up today he is doing very well. He was seen by IBS clinic in [place] for his chronic constipation. States he really didn't have IBS but does have slow colonic transit time. Now using metamucil.    Denies any chest pain, dyspnea, palpitations. Only gets SOB in cold air. No edema.  ICD check on 08/19/21 was normal.    Current Outpatient Medications on File Prior to Visit  Medication Sig Dispense Refill   aspirin EC 81 MG tablet Take 1 tablet (81 mg total) by mouth daily. 90 tablet 3   carvedilol (COREG) 25 MG tablet TAKE 1 TABLET TWICE DAILY WITH A MEAL 180 tablet 3   cholecalciferol (VITAMIN D) 1000 UNITS tablet Take 2,000 Units by mouth 2 (two) times daily.     docusate sodium (COLACE) 100 MG capsule Take 1-2 capsules by mouth 2 (two) times daily as needed for constipation.     fluticasone (FLONASE) 50 MCG/ACT nasal spray Place 2 sprays into the nose daily.     furosemide (LASIX) 40 MG tablet TAKE ONE TABLET BY MOUTH AS NEEDED 30 tablet 1   lisinopril (ZESTRIL) 5 MG tablet Take 5 mg by mouth daily.     loratadine (CLARITIN) 10 MG tablet Take 1 tablet by mouth daily.     montelukast (SINGULAIR) 10 MG tablet Take 10 mg by mouth at bedtime.     Multiple Vitamins-Minerals (MENS 50+ MULTI VITAMIN/MIN PO) Take by mouth daily.     Peppermint Oil (IBGARD) 90 MG CPCR Take by mouth as needed.     polyethylene glycol  (MIRALAX / GLYCOLAX) 17 g packet Take 17 g by mouth daily as needed. Pt uses 1/2 tsp as needed     simvastatin (ZOCOR) 20 MG tablet Take 1 tablet by mouth daily.     spironolactone (ALDACTONE) 25 MG tablet TAKE 1 TABLET AT BEDTIME 90 tablet 3   No current facility-administered medications on file prior to visit.    Allergies  Allergen Reactions   Penicillins Anaphylaxis    Other reaction(s): Hypotension   Codeine     Upset stomach    Shellfish-Derived Products Nausea And Vomiting    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    with pacemaker, CRT   LBBB (left bundle branch block)    LV dysfunction    Mitral insufficiency    Nonischemic dilated cardiomyopathy ([place])    Pulmonary hypertension ([place])     Past Surgical History:  Procedure Laterality Date   CARDIAC CATHETERIZATION  10/25/2001   EF 65%   CHOLECYSTECTOMY  COLONOSCOPY     EP IMPLANTABLE DEVICE N/A 09/14/2015   Procedure:  ICD Generator Changeout;  Surgeon: Deboraha Sprang, MD;  Location: [facility];  Service: Cardiovascular;  Laterality: N/A;   ICD     BIVENTRICULAR   POLYPECTOMY     US ECHOCARDIOGRAPHY  02/19/2009   EF 45%   wisdom teeth extraxtion      Social History   Tobacco Use  Smoking Status Never  Smokeless Tobacco Never    Social History   Substance and Sexual Activity  Alcohol Use No    Family History  Problem Relation Age of Onset   COPD Mother        deceased   Colon cancer Father        deceased age 11   Prostate cancer Maternal Grandmother    Esophageal cancer Neg Hx    Pancreatic cancer Neg Hx    Rectal cancer Neg Hx    Stomach cancer Neg Hx    Colon polyps Neg Hx      Review of Systems: As noted in history of present illness.  All other systems were reviewed and are negative.  Physical Exam: There were no vitals taken for this visit. GENERAL:  Well appearing WM in NAD HEENT:  PERRL, EOMI, sclera are clear. Oropharynx is clear. NECK:  No jugular venous distention, carotid upstroke brisk and symmetric, no bruits, no thyromegaly or adenopathy LUNGS:  Clear to auscultation bilaterally CHEST:  Unremarkable, ICD in left upper chest HEART:  RRR,  PMI not displaced or sustained,S1 and S2 within normal limits, no S3, no S4: no clicks, no rubs, no murmurs ABD:  Soft, nontender. BS +, no masses or bruits. No hepatomegaly, no splenomegaly EXT:  2 + pulses throughout, no edema, no cyanosis no clubbing SKIN:  Warm and dry.  No rashes NEURO:  Alert and oriented x 3. Cranial nerves II through XII intact. PSYCH:  Cognitively intact  LABORATORY DATA:  Lab Results  Component Value Date   WBC 11.2 (H) 09/24/2018   HGB 14.2 09/24/2018   HCT 41.3 09/24/2018   PLT 231 09/24/2018   GLUCOSE 79 04/16/2021   CHOL  10/21/2009    112        ATP III CLASSIFICATION:  <200     mg/dL   Desirable  200-239  mg/dL   Borderline High  >=240    mg/dL   High          TRIG 129 10/21/2009   HDL 34 (L) 10/21/2009   LDLCALC  10/21/2009    52        Total Cholesterol/HDL:CHD Risk Coronary Heart Disease Risk Table                     Men   Women  1/2 Average Risk   3.4   3.3  Average Risk       5.0   4.4  2 X Average Risk   9.6   7.1  3 X Average Risk  23.4   11.0        Use the calculated Patient Ratio above and the CHD Risk Table to determine the patient's CHD Risk.        ATP III CLASSIFICATION (LDL):  <100     mg/dL   Optimal  100-129  mg/dL   Near or Above                    Optimal  130-159  mg/dL   Borderline  160-189  mg/dL   High  >190     mg/dL   Very High   ALT 23 05/13/2015   AST 22 05/13/2015   NA 144 04/16/2021   K 3.8 04/16/2021   CL 104  04/16/2021   CREATININE 0.75 (L) 04/16/2021   BUN 12 04/16/2021   CO2 27 04/16/2021   TSH 0.63 06/16/2019   INR 1.1 ratio (H) 04/26/2009   HGBA1C 5.8 06/24/2019   Labs reviewed from primary care 04/03/16: CMET and CBC normal. Cholesterol 116, trig- 132, HDL 31, LDL 59. BNP 14.1. Jul 04, 2016: A1c 5.7%. Oct 07, 2016 A1c 5.6%.  Dated 04/15/17: glucose 115. A1c 5.6%. Otherwise CMET and CBC normal. Cholesterol 120, triglycerides 109, HDL 35, LDL 63.  Dated 10/05/17: glucose 152, otherwise CBC and CMET normal. Cholesterol 108, triglycerides 152, HDL 32, LDL 46 Dated 04/14/18: cholesterol 116, triglycerides 114, HDL 32, LDL 61. CMET normal.  Dated 06/10/19: normal CBC and CMET.  Dated 01/26/20: cholesterol 110, triglycerides 57, HDL 57, LDL 42.  CBC, CMET, Vit D normal.  Dated 01/02/21: cholesterol 126, triglycerides 47, HDL 56, LDL 59.  Dated 06/26/21: normal CMET and CBC.    Echo 11/14/16: Study Conclusions   - Left ventricle: The cavity size was normal. Wall thickness was   increased in a pattern of mild LVH. Systolic function was mildly   reduced. The estimated ejection fraction was in the range of 45%   to 50%. Incoordinate septal motion. Doppler parameters are   consistent with abnormal left ventricular relaxation (grade 1   diastolic dysfunction). The E/e&' ratio is between 8-15,   suggesting indeterminate LV filling pressure. - Left atrium: The atrium was normal in size. - Right ventricle: The cavity size was normal. Wall thickness was   normal. Pacer wire or catheter noted in right ventricle. Systolic   function was normal. - Right atrium: The atrium was mildly dilated. Pacer wire or   catheter noted in right atrium. - Atrial septum: No defect or patent foramen ovale was identified. - Tricuspid valve: There was mild regurgitation. - Pulmonary arteries: PA peak pressure: 27 mm Hg (S). - Inferior vena cava: The vessel was normal in size. The   respirophasic diameter changes were in the  normal range (>= 50%),   consistent with normal central venous pressure.   Impressions:   - Compared to a previous study in 2014, there has been no change.  Echo 08/23/20: IMPRESSIONS     1. Left ventricular ejection fraction, by estimation, is 50%. The left  ventricle has mildly decreased function. The left ventricle demonstrates  global hypokinesis. Left ventricular diastolic parameters are consistent  with Grade I diastolic dysfunction  (impaired relaxation).   2. Right ventricular systolic function is normal. The right ventricular  size is normal.   3. Left atrial size was moderately dilated.   4. The mitral valve is grossly normal. Trivial mitral valve  regurgitation.   5. The aortic valve is tricuspid. There is mild calcification of the  aortic valve. Aortic valve regurgitation is not visualized. No aortic  stenosis is present.   Comparison(s): A prior study was performed on 11/14/2016. Similar LV  function from prior.     Assessment / Plan: 1. Chronic systolic congestive heart failure. Last Ejection fraction of 50% in 2021 - this is unchanged. He has class 1 symptoms. He is on good medical therapy with an ACE inhibitor, carvedilol, and Aldactone.  He has a  biventricular pacemaker in place.   2. Status post ICD/biventricular pacemaker. Continue followup in EP clinic. S/p generator change out Jan 2017. ICD check in Dec was good.    3. Obesity.. At ideal weight. Encourage continued dietary modification.  4. Left bundle branch block.  Follow up in one year

## 2022-08-27 ENCOUNTER — Ambulatory Visit: Payer: Medicare PPO | Attending: Cardiology | Admitting: Cardiology

## 2022-08-29 ENCOUNTER — Ambulatory Visit (INDEPENDENT_AMBULATORY_CARE_PROVIDER_SITE_OTHER): Payer: Medicare PPO

## 2022-08-29 DIAGNOSIS — I428 Other cardiomyopathies: Secondary | ICD-10-CM | POA: Diagnosis not present

## 2022-08-29 LAB — CUP PACEART REMOTE DEVICE CHECK
Battery Remaining Longevity: 15 mo
Battery Voltage: 2.89 V
Brady Statistic AP VP Percent: 72.75 %
Brady Statistic AP VS Percent: 0.04 %
Brady Statistic AS VP Percent: 27.21 %
Brady Statistic AS VS Percent: 0.01 %
Brady Statistic RA Percent Paced: 72.77 %
Brady Statistic RV Percent Paced: 99.93 %
Date Time Interrogation Session: 20231222033424
HighPow Impedance: 54 Ohm
HighPow Impedance: 79 Ohm
Implantable Lead Connection Status: 753985
Implantable Lead Connection Status: 753985
Implantable Lead Connection Status: 753985
Implantable Lead Implant Date: 20050120
Implantable Lead Implant Date: 20050120
Implantable Lead Implant Date: 20100826
Implantable Lead Location: 753858
Implantable Lead Location: 753859
Implantable Lead Location: 753860
Implantable Lead Model: 4194
Implantable Lead Model: 5076
Implantable Lead Model: 5076
Implantable Pulse Generator Implant Date: 20170106
Lead Channel Impedance Value: 342 Ohm
Lead Channel Impedance Value: 399 Ohm
Lead Channel Impedance Value: 475 Ohm
Lead Channel Impedance Value: 589 Ohm
Lead Channel Impedance Value: 608 Ohm
Lead Channel Impedance Value: 760 Ohm
Lead Channel Pacing Threshold Amplitude: 0.5 V
Lead Channel Pacing Threshold Amplitude: 0.875 V
Lead Channel Pacing Threshold Amplitude: 1.25 V
Lead Channel Pacing Threshold Pulse Width: 0.4 ms
Lead Channel Pacing Threshold Pulse Width: 0.4 ms
Lead Channel Pacing Threshold Pulse Width: 0.4 ms
Lead Channel Sensing Intrinsic Amplitude: 18.125 mV
Lead Channel Sensing Intrinsic Amplitude: 18.125 mV
Lead Channel Sensing Intrinsic Amplitude: 2.875 mV
Lead Channel Sensing Intrinsic Amplitude: 2.875 mV
Lead Channel Setting Pacing Amplitude: 1.75 V
Lead Channel Setting Pacing Amplitude: 2 V
Lead Channel Setting Pacing Amplitude: 2.25 V
Lead Channel Setting Pacing Pulse Width: 0.4 ms
Lead Channel Setting Pacing Pulse Width: 0.4 ms
Lead Channel Setting Sensing Sensitivity: 0.45 mV
Zone Setting Status: 755011
Zone Setting Status: 755011

## 2022-09-04 ENCOUNTER — Encounter: Payer: Self-pay | Admitting: Cardiology

## 2022-09-18 NOTE — Progress Notes (Signed)
Remote ICD transmission.   

## 2022-10-04 NOTE — Progress Notes (Unsigned)
Cardiology Clinic Note   Patient Name: Jonathon Avila Date of Encounter: 10/07/2022  Primary Care Provider:  Chesley Noon, MD Primary Cardiologist:  None  Patient Profile    Jonathon Avila is a 67 y.o. male with a past medical history of chronic systolic heart failure/nonischemic cardiomyopathy s/p biventricular ICD, LBBB, hypertension, hyperlipidemia who presents to the clinic today for 1 year follow-up of cardiac conditions.  Past Medical History    Past Medical History:  Diagnosis Date   6949 lead    [phone] rate sense portion was replaced with a Medtronic 5076-lead 2010    Adenomatous polyps    Allergy    Chronic systolic CHF (congestive heart failure) (HCC)    Hyperlipidemia    Hypertension    controlled on medications    ICD (implantable cardioverter-defibrillator) battery depletion    with pacemaker, CRT   LBBB (left bundle branch block)    LV dysfunction    Mitral insufficiency    Nonischemic dilated cardiomyopathy ([place])    Pulmonary hypertension ([place])    Past Surgical History:  Procedure Laterality Date   CARDIAC CATHETERIZATION  10/25/2001   EF 65%   CHOLECYSTECTOMY     COLONOSCOPY     EP IMPLANTABLE DEVICE N/A 09/14/2015   Procedure:  ICD Generator Changeout;  Surgeon: Deboraha Sprang, MD;  Location: [facility];  Service: Cardiovascular;  Laterality: N/A;   ICD     BIVENTRICULAR   POLYPECTOMY     US ECHOCARDIOGRAPHY  02/19/2009   EF 45%   wisdom teeth extraxtion      Allergies  Allergies  Allergen Reactions   Penicillins Anaphylaxis    Other reaction(s): Hypotension   Codeine     Upset stomach    Shellfish-Derived Products Nausea And Vomiting    History of Present Illness    Jonathon Avila has a past medical history of: Chronic systolic heart failure/nonischemic cardiomyopathy. ICD implantation 09/28/2003: Medtronic biventricular ICD. Generator change out and RV lead replacement 05/03/2009: Medtronic CRT-D and new RV  lead placement. Generator change out 09/14/2015: Medtronic CRT-D. Remote device check 08/29/2022: Normal device function.  Battery status good.  Lead measurements unchanged. Echo 08/23/2020: EF 50%.  Global hypokinesis.  Grade I DD.  Moderate LAE.  Trivial MR.  Mild calcification of aortic valve.  Unchanged from 11/14/2016. LBBB. LHC 10/25/2001 (reversible ischemia found on stress test): Noncritical disease in the distal LAD following the second diagonal, false positive adenosine Cardiolite most likely related to LBBB.  Recommendation LDL goal less than 100. Hypertension. Hyperlipidemia. Lipid panel 07/15/2022: LDL 58, HDL 45, TG 84, total 120.  [name] was first evaluated by Dr. Caryl Comes in May 2004 during hospitalization for acute systolic heart failure and nonischemic cardiomyopathy.  He is also followed by Dr. Martinique for the above outlined history.  Patient was last seen in the office by Dr. Martinique on 08/26/2021.  At that time he was doing well and no medication changes were made.  He last saw Dr. Caryl Comes in the office on 03/12/2022.  Today, patient is alone today. He reports he is doing well. He follows a heart healthy diet and tries to walk 45-60 minutes per day weather permitting. If he cannot get outside he will do laps in [employer]. When the weather is warm he enjoys golfing. Patient denies shortness of breath or dyspnea on exertion. No chest pain, pressure, or tightness. Denies lower extremity edema or PND. No palpitations. He reports mild orthopnea that is not  unusual for him. He states he is able to lay flat but prefers to be propped up a little. He spot checks his weight and reports it is stable. Patient's BP a little soft today. He reports it is normally in 110s/60s. He denies dizziness or lightheadedness.    Home Medications    Current Meds  Medication Sig   aspirin EC 81 MG tablet Take 1 tablet (81 mg total) by mouth daily.   carvedilol (COREG) 25 MG tablet TAKE 1 TABLET TWICE DAILY WITH  A MEAL   cholecalciferol (VITAMIN D) 1000 UNITS tablet Take 2,000 Units by mouth 2 (two) times daily.   diclofenac Sodium (VOLTAREN) 1 % GEL Apply 4 g topically as needed.   fluticasone (FLONASE) 50 MCG/ACT nasal spray Place 2 sprays into the nose daily.   lisinopril (ZESTRIL) 10 MG tablet Take 10 mg by mouth in the morning.   lisinopril (ZESTRIL) 5 MG tablet Take 5 mg by mouth daily.   loratadine (CLARITIN) 10 MG tablet Take 1 tablet by mouth daily.   montelukast (SINGULAIR) 10 MG tablet Take 10 mg by mouth at bedtime.   Multiple Vitamins-Minerals (MENS 50+ MULTI VITAMIN/MIN PO) Take by mouth daily.   polyethylene glycol (MIRALAX / GLYCOLAX) 17 g packet Take 17 g by mouth daily as needed. Pt uses 1/2 tsp as needed   simvastatin (ZOCOR) 20 MG tablet Take 1 tablet by mouth daily.   spironolactone (ALDACTONE) 25 MG tablet TAKE 1 TABLET AT BEDTIME   tamsulosin (FLOMAX) 0.4 MG CAPS capsule Take 0.4 mg by mouth every evening.    Family History    Family History  Problem Relation Age of Onset   COPD Mother        deceased   Colon cancer Father        deceased age 62   Prostate cancer Maternal Grandmother    Esophageal cancer Neg Hx    Pancreatic cancer Neg Hx    Rectal cancer Neg Hx    Stomach cancer Neg Hx    Colon polyps Neg Hx    He indicated that his mother is deceased. He indicated that his father is deceased. He indicated that his sister is alive. He indicated that his maternal grandmother is deceased. He indicated that his maternal grandfather is deceased. He indicated that his paternal grandmother is deceased. He indicated that his paternal grandfather is deceased. He indicated that the status of his neg hx is unknown.   Social History    Social History   Socioeconomic History   Marital status: Married    Spouse name: Not on file   Number of children: 2   Years of education: Not on file   Highest education level: Not on file  Occupational History   Occupation: Research officer, trade union: UNEMPLOYED    Comment: retired  Tobacco Use   Smoking status: Never   Smokeless tobacco: Never  Vaping Use   Vaping Use: Never used  Substance and Sexual Activity   Alcohol use: No   Drug use: No   Sexual activity: Not on file  Other Topics Concern   Not on file  Social History Narrative   Not on file   Social Determinants of Health   Financial Resource Strain: Not on file  Food Insecurity: Not on file  Transportation Needs: Not on file  Physical Activity: Not on file  Stress: Not on file  Social Connections: Not on file  Intimate Partner Violence: Not on file  Review of Systems    General: No chills, fever, night sweats or weight changes.  Cardiovascular:  No chest pain, dyspnea on exertion, edema, orthopnea, palpitations, paroxysmal nocturnal dyspnea. Dermatological: No rash, lesions/masses Respiratory: No cough, dyspnea Urologic: No hematuria, dysuria Abdominal:   No nausea, vomiting, diarrhea, bright red blood per rectum, melena, or hematemesis Neurologic:  No visual changes, weakness, changes in mental status. All other systems reviewed and are otherwise negative except as noted above.  Physical Exam    VS:  BP (!) 96/58 (BP Location: Left Arm, Patient Position: Sitting, Cuff Size: Large)   Pulse 64   Ht '5\' 7"'$  (1.702 m)   Wt 192 lb 9.6 oz (87.4 kg)   SpO2 95%   BMI 30.17 kg/m  , BMI Body mass index is 30.17 kg/m. GEN:  Well nourished, well developed, in no acute distress. HEENT: Normal. Neck: Supple, no JVD, carotid bruits, or masses. Cardiac: RRR, no murmurs, rubs, or gallops. No clubbing, cyanosis, edema.  Radials/DP/PT 2+ and equal bilaterally.  Respiratory:  Respirations regular and unlabored, clear to auscultation bilaterally. GI: Soft, nontender, nondistended. MS: No deformity or atrophy. Skin: Warm and dry, no rash. Neuro: Strength and sensation are intact. Psych: Normal affect.  Accessory Clinical Findings    Recent  Labs: 07/15/2022 (from [facility]): BUN 9, CRT 0.73, Sodium 140, Potassium 4.1, Calcium 9.5, AST 21, ALT 16. Plt 134.  Recent Lipid Panel Lipid panel 07/15/2022 (from [facility]): LDL 58, HDL 45, TG 84, total 120.  ECG personally reviewed by me today: AV paced, rate 64 bpm.  No significant changes from 03/12/2022.   Assessment & Plan   Chronic systolic heart failure/nonischemic cardiomyopathy.  S/p CRT-D 2005, last GEN change January 2017.  Patient denies lower extremity edema, DOE or PND. He reports mild orthopnea that is not new for him. He states he is able to lay flat but reports he prefers not to. Euvolemic and well compensated on exam.  Continue carvedilol, Lasix, lisinopril, and spironolactone.  Hypertension.  BP today 96/58 at intake and 108/60 at recheck. Patient denies dizziness or lightheadedness. BP normally runs in the 110s/60s. Continue carvedilol and lisinopril. Hyperlipidemia.  LDL November 2023 58, at goal. Continue simvastatin.       Disposition: Return in 1 year or sooner as needed.    Justice Britain. Lexton Hidalgo, DNP, NP-C     10/07/2022, 11:30 AM [address] Office [phone] Fax [phone]

## 2022-10-07 ENCOUNTER — Ambulatory Visit: Payer: Medicare PPO | Attending: Cardiology | Admitting: Student

## 2022-10-07 ENCOUNTER — Encounter: Payer: Self-pay | Admitting: Student

## 2022-10-07 VITALS — BP 96/58 | HR 64 | Ht 67.0 in | Wt 192.6 lb

## 2022-10-07 DIAGNOSIS — I10 Essential (primary) hypertension: Secondary | ICD-10-CM

## 2022-10-07 DIAGNOSIS — E785 Hyperlipidemia, unspecified: Secondary | ICD-10-CM | POA: Diagnosis not present

## 2022-10-07 DIAGNOSIS — I428 Other cardiomyopathies: Secondary | ICD-10-CM

## 2022-10-07 DIAGNOSIS — I5022 Chronic systolic (congestive) heart failure: Secondary | ICD-10-CM

## 2022-10-07 NOTE — Patient Instructions (Addendum)
Medication Instructions:  The current medical regimen is effective;  continue present plan and medications as directed. Please refer to the Current Medication list given to you today.  *If you need a refill on your cardiac medications before your next appointment, please call your pharmacy*  Lab Work: NONE If you have labs (blood work) drawn today and your tests are completely normal, you will receive your results only by: [place] (if you have MyChart) OR A paper copy in the mail If you have any lab test that is abnormal or we need to change your treatment, we will call you to review the results.  Testing/Procedures: NONE   Follow-Up: At [facility], you and your health needs are our priority.  As part of our continuing mission to provide you with exceptional heart care, we have created designated Provider Care Teams.  These Care Teams include your primary Cardiologist (physician) and Advanced Practice Providers (APPs -  Physician Assistants and Nurse Practitioners) who all work together to provide you with the care you need, when you need it.  Your next appointment:   12 month(s)  Provider:   Peter Martinique, MD  or Coletta Memos, FNP or Mayra Reel, NP        Other Instructions

## 2022-10-11 ENCOUNTER — Other Ambulatory Visit: Payer: Self-pay | Admitting: Cardiology

## 2022-11-25 ENCOUNTER — Telehealth: Payer: Self-pay | Admitting: Cardiology

## 2022-11-25 NOTE — Telephone Encounter (Signed)
Paper Work Dropped Off: Application for renewal of Parking Placard   Date: 11/25/2022  Location of paper:  Provider mailbox

## 2022-11-25 NOTE — Telephone Encounter (Signed)
Patient stated he dropped of paperwork and he does not need any further assistance at the time.

## 2022-11-25 NOTE — Telephone Encounter (Signed)
FYI--Patient states he plans to drop off paperwork for a handicap sticker and he is hopeful that Dr. Martinique can assist with completing it when he has a chance. He states there is not a rush on getting the paperwork completed, he just wanted to get ahead of it. He states he plans to come by the office this morning around 10:30 AM.

## 2022-11-25 NOTE — Telephone Encounter (Signed)
Spoke to patient handicapp parking placard completed.Left at front desk for pick up.

## 2022-11-27 ENCOUNTER — Telehealth: Payer: Self-pay | Admitting: Cardiology

## 2022-11-27 NOTE — Telephone Encounter (Signed)
Spoke to patient advised Dr.Jordan is out of office I will have him sign handicap placard.I will call when ready for pick up.

## 2022-11-27 NOTE — Telephone Encounter (Signed)
Paper Work Dropped Off: Medical Renewal parking Placard  Date: 11/27/2022  Location of paper: Provider Mailbox

## 2022-11-28 ENCOUNTER — Ambulatory Visit (INDEPENDENT_AMBULATORY_CARE_PROVIDER_SITE_OTHER): Payer: Medicare PPO

## 2022-11-28 DIAGNOSIS — I428 Other cardiomyopathies: Secondary | ICD-10-CM

## 2022-11-28 LAB — CUP PACEART REMOTE DEVICE CHECK
Battery Remaining Longevity: 12 mo
Battery Voltage: 2.87 V
Brady Statistic AP VP Percent: 70.53 %
Brady Statistic AP VS Percent: 0.04 %
Brady Statistic AS VP Percent: 29.41 %
Brady Statistic AS VS Percent: 0.02 %
Brady Statistic RA Percent Paced: 70.53 %
Brady Statistic RV Percent Paced: 99.88 %
Date Time Interrogation Session: 20240322033323
HighPow Impedance: 55 Ohm
HighPow Impedance: 82 Ohm
Implantable Lead Connection Status: 753985
Implantable Lead Connection Status: 753985
Implantable Lead Connection Status: 753985
Implantable Lead Implant Date: 20050120
Implantable Lead Implant Date: 20050120
Implantable Lead Implant Date: 20100826
Implantable Lead Location: 753858
Implantable Lead Location: 753859
Implantable Lead Location: 753860
Implantable Lead Model: 4194
Implantable Lead Model: 5076
Implantable Lead Model: 5076
Implantable Pulse Generator Implant Date: 20170106
Lead Channel Impedance Value: 285 Ohm
Lead Channel Impedance Value: 418 Ohm
Lead Channel Impedance Value: 532 Ohm
Lead Channel Impedance Value: 608 Ohm
Lead Channel Impedance Value: 665 Ohm
Lead Channel Impedance Value: 760 Ohm
Lead Channel Pacing Threshold Amplitude: 0.5 V
Lead Channel Pacing Threshold Amplitude: 0.875 V
Lead Channel Pacing Threshold Amplitude: 0.875 V
Lead Channel Pacing Threshold Pulse Width: 0.4 ms
Lead Channel Pacing Threshold Pulse Width: 0.4 ms
Lead Channel Pacing Threshold Pulse Width: 0.4 ms
Lead Channel Sensing Intrinsic Amplitude: 18.625 mV
Lead Channel Sensing Intrinsic Amplitude: 18.625 mV
Lead Channel Sensing Intrinsic Amplitude: 2.875 mV
Lead Channel Sensing Intrinsic Amplitude: 2.875 mV
Lead Channel Setting Pacing Amplitude: 2 V
Lead Channel Setting Pacing Amplitude: 2 V
Lead Channel Setting Pacing Amplitude: 2.25 V
Lead Channel Setting Pacing Pulse Width: 0.4 ms
Lead Channel Setting Pacing Pulse Width: 0.4 ms
Lead Channel Setting Sensing Sensitivity: 0.45 mV
Zone Setting Status: 755011
Zone Setting Status: 755011

## 2022-12-08 NOTE — Telephone Encounter (Signed)
Spoke to patient last week.Handicap parking placard signed by Dr.Jordan.Left at front desk for pick up.

## 2022-12-30 NOTE — Progress Notes (Signed)
Remote ICD transmission.   

## 2023-02-27 ENCOUNTER — Ambulatory Visit (INDEPENDENT_AMBULATORY_CARE_PROVIDER_SITE_OTHER): Payer: Medicare PPO

## 2023-02-27 DIAGNOSIS — I428 Other cardiomyopathies: Secondary | ICD-10-CM

## 2023-02-27 DIAGNOSIS — I5022 Chronic systolic (congestive) heart failure: Secondary | ICD-10-CM

## 2023-02-27 LAB — CUP PACEART REMOTE DEVICE CHECK
Battery Remaining Longevity: 9 mo
Battery Voltage: 2.85 V
Brady Statistic AP VP Percent: 72.3 %
Brady Statistic AP VS Percent: 0.04 %
Brady Statistic AS VP Percent: 27.64 %
Brady Statistic AS VS Percent: 0.02 %
Brady Statistic RA Percent Paced: 72.24 %
Brady Statistic RV Percent Paced: 99.81 %
Date Time Interrogation Session: 20240621073726
HighPow Impedance: 51 Ohm
HighPow Impedance: 79 Ohm
Implantable Lead Connection Status: 753985
Implantable Lead Connection Status: 753985
Implantable Lead Connection Status: 753985
Implantable Lead Implant Date: 20050120
Implantable Lead Implant Date: 20050120
Implantable Lead Implant Date: 20100826
Implantable Lead Location: 753858
Implantable Lead Location: 753859
Implantable Lead Location: 753860
Implantable Lead Model: 4194
Implantable Lead Model: 5076
Implantable Lead Model: 5076
Implantable Pulse Generator Implant Date: 20170106
Lead Channel Impedance Value: 285 Ohm
Lead Channel Impedance Value: 418 Ohm
Lead Channel Impedance Value: 532 Ohm
Lead Channel Impedance Value: 589 Ohm
Lead Channel Impedance Value: 608 Ohm
Lead Channel Impedance Value: 722 Ohm
Lead Channel Pacing Threshold Amplitude: 0.5 V
Lead Channel Pacing Threshold Amplitude: 0.875 V
Lead Channel Pacing Threshold Amplitude: 1.125 V
Lead Channel Pacing Threshold Pulse Width: 0.4 ms
Lead Channel Pacing Threshold Pulse Width: 0.4 ms
Lead Channel Pacing Threshold Pulse Width: 0.4 ms
Lead Channel Sensing Intrinsic Amplitude: 15.375 mV
Lead Channel Sensing Intrinsic Amplitude: 15.375 mV
Lead Channel Sensing Intrinsic Amplitude: 3.75 mV
Lead Channel Sensing Intrinsic Amplitude: 3.75 mV
Lead Channel Setting Pacing Amplitude: 1.75 V
Lead Channel Setting Pacing Amplitude: 2 V
Lead Channel Setting Pacing Amplitude: 2.25 V
Lead Channel Setting Pacing Pulse Width: 0.4 ms
Lead Channel Setting Pacing Pulse Width: 0.4 ms
Lead Channel Setting Sensing Sensitivity: 0.45 mV
Zone Setting Status: 755011
Zone Setting Status: 755011

## 2023-03-17 ENCOUNTER — Encounter: Payer: Self-pay | Admitting: Internal Medicine

## 2023-03-17 ENCOUNTER — Ambulatory Visit: Payer: Medicare PPO | Attending: Internal Medicine | Admitting: Internal Medicine

## 2023-03-17 VITALS — BP 110/64 | HR 58 | Ht 67.0 in | Wt 182.8 lb

## 2023-03-17 DIAGNOSIS — I5022 Chronic systolic (congestive) heart failure: Secondary | ICD-10-CM

## 2023-03-17 DIAGNOSIS — I447 Left bundle-branch block, unspecified: Secondary | ICD-10-CM | POA: Diagnosis not present

## 2023-03-17 DIAGNOSIS — Z95810 Presence of automatic (implantable) cardiac defibrillator: Secondary | ICD-10-CM | POA: Diagnosis not present

## 2023-03-17 LAB — CUP PACEART INCLINIC DEVICE CHECK
Battery Remaining Longevity: 8 mo
Battery Voltage: 2.84 V
Brady Statistic AP VP Percent: 72.04 %
Brady Statistic AP VS Percent: 0.04 %
Brady Statistic AS VP Percent: 27.9 %
Brady Statistic AS VS Percent: 0.01 %
Brady Statistic RA Percent Paced: 72.03 %
Brady Statistic RV Percent Paced: 99.87 %
Date Time Interrogation Session: 20240709171853
HighPow Impedance: 51 Ohm
HighPow Impedance: 82 Ohm
Implantable Lead Connection Status: 753985
Implantable Lead Connection Status: 753985
Implantable Lead Connection Status: 753985
Implantable Lead Implant Date: 20050120
Implantable Lead Implant Date: 20050120
Implantable Lead Implant Date: 20100826
Implantable Lead Location: 753858
Implantable Lead Location: 753859
Implantable Lead Location: 753860
Implantable Lead Model: 4194
Implantable Lead Model: 5076
Implantable Lead Model: 5076
Implantable Pulse Generator Implant Date: 20170106
Lead Channel Impedance Value: 304 Ohm
Lead Channel Impedance Value: 399 Ohm
Lead Channel Impedance Value: 475 Ohm
Lead Channel Impedance Value: 551 Ohm
Lead Channel Impedance Value: 608 Ohm
Lead Channel Impedance Value: 722 Ohm
Lead Channel Pacing Threshold Amplitude: 0.625 V
Lead Channel Pacing Threshold Amplitude: 0.875 V
Lead Channel Pacing Threshold Amplitude: 1.125 V
Lead Channel Pacing Threshold Pulse Width: 0.4 ms
Lead Channel Pacing Threshold Pulse Width: 0.4 ms
Lead Channel Pacing Threshold Pulse Width: 0.4 ms
Lead Channel Sensing Intrinsic Amplitude: 13.875 mV
Lead Channel Sensing Intrinsic Amplitude: 15 mV
Lead Channel Sensing Intrinsic Amplitude: 2.75 mV
Lead Channel Sensing Intrinsic Amplitude: 5.375 mV
Lead Channel Setting Pacing Amplitude: 1.75 V
Lead Channel Setting Pacing Amplitude: 2 V
Lead Channel Setting Pacing Amplitude: 2.25 V
Lead Channel Setting Pacing Pulse Width: 0.4 ms
Lead Channel Setting Pacing Pulse Width: 0.4 ms
Lead Channel Setting Sensing Sensitivity: 0.45 mV
Zone Setting Status: 755011
Zone Setting Status: 755011

## 2023-03-17 NOTE — Progress Notes (Signed)
Remote ICD transmission.   

## 2023-03-17 NOTE — Patient Instructions (Signed)
Medication Instructions:  Your physician has recommended you make the following change in your medication:  1) Take your Lasix (furosemide) 40 mg once daily for the next to days, then back to as needed   *If you need a refill on your cardiac medications before your next appointment, please call your pharmacy*  Follow-Up: At [facility], you and your health needs are our priority.  As part of our continuing mission to provide you with exceptional heart care, we have created designated Provider Care Teams.  These Care Teams include your primary Cardiologist (physician) and Advanced Practice Providers (APPs -  Physician Assistants and Nurse Practitioners) who all work together to provide you with the care you need, when you need it.  Your next appointment:   We will call you for your next appointment once it is time to change the battery on your device  Provider:   Dr. Graciela Husbands

## 2023-03-17 NOTE — Progress Notes (Signed)
Patient ID: Jonathon Avila, male   DOB: 01/12/1956, 67 y.o.   MRN: [id number]    HPI  Jonathon Avila is a 67 y.o. male seen in followup for a CRT-D. implanted for congestive heart failure in the setting of nonischemic heart disease He had a 6949 lead, the RS-portion has been replaced with 5076; generator replacement 1/17.     The patient denies chest pain, shortness of breath, nocturnal dyspnea, orthopnea or peripheral edema.  There have been no palpitations, lightheadedness or syncope.  [name]   DATE TEST EF%   10/14  Echo    45-50 %   3/18  Echo  45-50 %   12/21 Echo   50%    Date Cr K Hgb  5/17  0.7 4.2    4/19  0.97 4.2    1/20 0.82 4.5 14.2  1/21 0.73 4.1   4/22(CE) 0.81 4.0 15.7 (11/22)  6/23 (CE)  0.86 3.9<<3.4   4/24 (CE)  0.87 4.0 15.6    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    with pacemaker, CRT   LBBB (left bundle branch block)    LV dysfunction    Mitral insufficiency    Nonischemic dilated cardiomyopathy (HCC)    Pulmonary hypertension (HCC)     Past Surgical History:  Procedure Laterality Date   CARDIAC CATHETERIZATION  10/25/2001   EF 65%   CHOLECYSTECTOMY     COLONOSCOPY     EP IMPLANTABLE DEVICE N/A 09/14/2015   Procedure:  ICD Generator Changeout;  Surgeon: Duke Salvia, MD;  Location: [facility];  Service: Cardiovascular;  Laterality: N/A;   ICD     BIVENTRICULAR   POLYPECTOMY     US ECHOCARDIOGRAPHY  02/19/2009   EF 45%   wisdom teeth extraxtion      Current Outpatient Medications  Medication Sig Dispense Refill   aspirin EC 81 MG tablet Take 1 tablet (81 mg total) by mouth daily. 90 tablet 3   azelastine (ASTELIN) 0.1 %  nasal spray Place 1 spray into both nostrils 2 (two) times daily.     carvedilol (COREG) 25 MG tablet Take 1 tablet (25 mg total) by mouth 2 (two) times daily with a meal. 180 tablet 3   cholecalciferol (VITAMIN D) 1000 UNITS tablet Take 2,000 Units by mouth 2 (two) times daily.     diclofenac Sodium (VOLTAREN) 1 % GEL Apply 4 g topically as needed.     docusate sodium (COLACE) 100 MG capsule Take 1-2 capsules by mouth 2 (two) times daily as needed for constipation.     fluticasone (FLONASE) 50 MCG/ACT nasal spray Place 2 sprays into the nose daily.     furosemide (LASIX) 40 MG tablet TAKE ONE TABLET BY MOUTH AS NEEDED 30 tablet 1   lisinopril (ZESTRIL) 10 MG tablet Take 1 tablet by mouth daily.     loratadine (CLARITIN) 10 MG tablet Take 1 tablet by mouth daily.     montelukast (SINGULAIR) 10 MG tablet Take 10 mg by mouth at bedtime.     Multiple Vitamins-Minerals (MENS 50+ MULTI VITAMIN/MIN PO) Take by mouth daily.  Peppermint Oil (IBGARD) 90 MG CPCR Take by mouth as needed.     polyethylene glycol (MIRALAX / GLYCOLAX) 17 g packet Take 17 g by mouth daily as needed. Pt uses 1/2 tsp as needed     simvastatin (ZOCOR) 20 MG tablet Take 1 tablet by mouth daily.     spironolactone (ALDACTONE) 25 MG tablet Take 1 tablet (25 mg total) by mouth at bedtime. 90 tablet 3   tamsulosin (FLOMAX) 0.4 MG CAPS capsule Take 0.4 mg by mouth every evening.     No current facility-administered medications for this visit.    Allergies  Allergen Reactions   Penicillins Anaphylaxis    Other reaction(s): Hypotension   Codeine     Upset stomach    Shellfish-Derived Products Nausea And Vomiting    Review of Systems negative except from HPI and PMH  Physical Exam: BP 110/64   Pulse (!) 58   Ht 5\' 7"  (1.702 m)   Wt 182 lb 12.8 oz (82.9 kg)   SpO2 96%   BMI 28.63 kg/m  Well developed and well nourished in no acute distress HENT normal Neck supple with JVP-flat Clear Device pocket well healed;  without hematoma or erythema.  There is no tethering  Regular rate and rhythm, no  gallop No murmur Abd-soft with active BS No Clubbing cyanosis tr edema Skin-warm and dry A & Oriented  Grossly normal sensory and motor function  ECG AV pacing at 83 Intervals 14/17/43 Upright QRS lead V1 negative QRS lead I  Device function is normal. Programming changes none  See Paceart for details    Assessment and  Plan  Nonischemic cardiomyopathy with interval normalization    6949-lead R/S replaced with a 5076  Systolic heart failure chronic class 2   Euvolemic, but OptiVol is elevated.  We will have him take his furosemide 40 mg for 2 days 0.    With his recovered cardiomyopathy, continue lisinopril carvedilol   and Aldactone.   Device approaching ERI.

## 2023-05-29 ENCOUNTER — Ambulatory Visit (INDEPENDENT_AMBULATORY_CARE_PROVIDER_SITE_OTHER): Payer: Medicare PPO

## 2023-05-29 DIAGNOSIS — I428 Other cardiomyopathies: Secondary | ICD-10-CM | POA: Diagnosis not present

## 2023-06-01 LAB — CUP PACEART REMOTE DEVICE CHECK
Battery Remaining Longevity: 6 mo
Battery Voltage: 2.76 V
Brady Statistic AP VP Percent: 82.7 %
Brady Statistic AP VS Percent: 0.05 %
Brady Statistic AS VP Percent: 17.25 %
Brady Statistic AS VS Percent: 0.01 %
Brady Statistic RA Percent Paced: 82.54 %
Brady Statistic RV Percent Paced: 99.62 %
Date Time Interrogation Session: 20240923130413
HighPow Impedance: 55 Ohm
HighPow Impedance: 84 Ohm
Implantable Lead Connection Status: 753985
Implantable Lead Connection Status: 753985
Implantable Lead Connection Status: 753985
Implantable Lead Implant Date: 20050120
Implantable Lead Implant Date: 20050120
Implantable Lead Implant Date: 20100826
Implantable Lead Location: 753858
Implantable Lead Location: 753859
Implantable Lead Location: 753860
Implantable Lead Model: 4194
Implantable Lead Model: 5076
Implantable Lead Model: 5076
Implantable Pulse Generator Implant Date: 20170106
Lead Channel Impedance Value: 247 Ohm
Lead Channel Impedance Value: 418 Ohm
Lead Channel Impedance Value: 475 Ohm
Lead Channel Impedance Value: 551 Ohm
Lead Channel Impedance Value: 608 Ohm
Lead Channel Impedance Value: 722 Ohm
Lead Channel Pacing Threshold Amplitude: 0.5 V
Lead Channel Pacing Threshold Amplitude: 0.875 V
Lead Channel Pacing Threshold Amplitude: 1 V
Lead Channel Pacing Threshold Pulse Width: 0.4 ms
Lead Channel Pacing Threshold Pulse Width: 0.4 ms
Lead Channel Pacing Threshold Pulse Width: 0.4 ms
Lead Channel Sensing Intrinsic Amplitude: 15.875 mV
Lead Channel Sensing Intrinsic Amplitude: 15.875 mV
Lead Channel Sensing Intrinsic Amplitude: 2.25 mV
Lead Channel Sensing Intrinsic Amplitude: 2.25 mV
Lead Channel Setting Pacing Amplitude: 1.75 V
Lead Channel Setting Pacing Amplitude: 2 V
Lead Channel Setting Pacing Amplitude: 2 V
Lead Channel Setting Pacing Pulse Width: 0.4 ms
Lead Channel Setting Pacing Pulse Width: 0.4 ms
Lead Channel Setting Sensing Sensitivity: 0.45 mV
Zone Setting Status: 755011
Zone Setting Status: 755011

## 2023-06-08 NOTE — Progress Notes (Signed)
Remote ICD transmission.   

## 2023-07-03 ENCOUNTER — Ambulatory Visit (INDEPENDENT_AMBULATORY_CARE_PROVIDER_SITE_OTHER): Payer: Medicare PPO

## 2023-07-03 DIAGNOSIS — I428 Other cardiomyopathies: Secondary | ICD-10-CM | POA: Diagnosis not present

## 2023-07-06 LAB — CUP PACEART REMOTE DEVICE CHECK
Battery Remaining Longevity: 5 mo
Battery Voltage: 2.81 V
Brady Statistic AP VP Percent: 84.74 %
Brady Statistic AP VS Percent: 0.04 %
Brady Statistic AS VP Percent: 15.2 %
Brady Statistic AS VS Percent: 0.02 %
Brady Statistic RA Percent Paced: 84.65 %
Brady Statistic RV Percent Paced: 99.74 %
Date Time Interrogation Session: 20241025102507
HighPow Impedance: 54 Ohm
HighPow Impedance: 85 Ohm
Implantable Lead Connection Status: 753985
Implantable Lead Connection Status: 753985
Implantable Lead Connection Status: 753985
Implantable Lead Implant Date: 20050120
Implantable Lead Implant Date: 20050120
Implantable Lead Implant Date: 20100826
Implantable Lead Location: 753858
Implantable Lead Location: 753859
Implantable Lead Location: 753860
Implantable Lead Model: 4194
Implantable Lead Model: 5076
Implantable Lead Model: 5076
Implantable Pulse Generator Implant Date: 20170106
Lead Channel Impedance Value: 285 Ohm
Lead Channel Impedance Value: 418 Ohm
Lead Channel Impedance Value: 475 Ohm
Lead Channel Impedance Value: 589 Ohm
Lead Channel Impedance Value: 608 Ohm
Lead Channel Impedance Value: 703 Ohm
Lead Channel Pacing Threshold Amplitude: 0.5 V
Lead Channel Pacing Threshold Amplitude: 0.875 V
Lead Channel Pacing Threshold Amplitude: 1 V
Lead Channel Pacing Threshold Pulse Width: 0.4 ms
Lead Channel Pacing Threshold Pulse Width: 0.4 ms
Lead Channel Pacing Threshold Pulse Width: 0.4 ms
Lead Channel Sensing Intrinsic Amplitude: 14 mV
Lead Channel Sensing Intrinsic Amplitude: 14 mV
Lead Channel Sensing Intrinsic Amplitude: 2.25 mV
Lead Channel Sensing Intrinsic Amplitude: 2.25 mV
Lead Channel Setting Pacing Amplitude: 1.75 V
Lead Channel Setting Pacing Amplitude: 2 V
Lead Channel Setting Pacing Amplitude: 2 V
Lead Channel Setting Pacing Pulse Width: 0.4 ms
Lead Channel Setting Pacing Pulse Width: 0.4 ms
Lead Channel Setting Sensing Sensitivity: 0.45 mV
Zone Setting Status: 755011
Zone Setting Status: 755011

## 2023-07-20 ENCOUNTER — Encounter: Payer: Self-pay | Admitting: Cardiology

## 2023-07-20 NOTE — Telephone Encounter (Signed)
Spoke to patient Dr.Jordan's advice given. 

## 2023-07-21 NOTE — Progress Notes (Signed)
Remote ICD transmission.   

## 2023-08-03 ENCOUNTER — Ambulatory Visit (INDEPENDENT_AMBULATORY_CARE_PROVIDER_SITE_OTHER): Payer: Medicare PPO

## 2023-08-03 DIAGNOSIS — I5022 Chronic systolic (congestive) heart failure: Secondary | ICD-10-CM

## 2023-08-03 DIAGNOSIS — I428 Other cardiomyopathies: Secondary | ICD-10-CM

## 2023-08-05 ENCOUNTER — Other Ambulatory Visit: Payer: Self-pay | Admitting: Cardiology

## 2023-08-05 LAB — CUP PACEART REMOTE DEVICE CHECK
Battery Remaining Longevity: 4 mo
Battery Voltage: 2.8 V
Brady Statistic AP VP Percent: 89.56 %
Brady Statistic AP VS Percent: 0.06 %
Brady Statistic AS VP Percent: 10.37 %
Brady Statistic AS VS Percent: 0.01 %
Brady Statistic RA Percent Paced: 89.52 %
Brady Statistic RV Percent Paced: 99.81 %
Date Time Interrogation Session: 20241127133319
HighPow Impedance: 50 Ohm
HighPow Impedance: 78 Ohm
Implantable Lead Connection Status: 753985
Implantable Lead Connection Status: 753985
Implantable Lead Connection Status: 753985
Implantable Lead Implant Date: 20050120
Implantable Lead Implant Date: 20050120
Implantable Lead Implant Date: 20100826
Implantable Lead Location: 753858
Implantable Lead Location: 753859
Implantable Lead Location: 753860
Implantable Lead Model: 4194
Implantable Lead Model: 5076
Implantable Lead Model: 5076
Implantable Pulse Generator Implant Date: 20170106
Lead Channel Impedance Value: 247 Ohm
Lead Channel Impedance Value: 399 Ohm
Lead Channel Impedance Value: 513 Ohm
Lead Channel Impedance Value: 532 Ohm
Lead Channel Impedance Value: 608 Ohm
Lead Channel Impedance Value: 703 Ohm
Lead Channel Pacing Threshold Amplitude: 0.5 V
Lead Channel Pacing Threshold Amplitude: 0.875 V
Lead Channel Pacing Threshold Amplitude: 1.125 V
Lead Channel Pacing Threshold Pulse Width: 0.4 ms
Lead Channel Pacing Threshold Pulse Width: 0.4 ms
Lead Channel Pacing Threshold Pulse Width: 0.4 ms
Lead Channel Sensing Intrinsic Amplitude: 1.75 mV
Lead Channel Sensing Intrinsic Amplitude: 1.75 mV
Lead Channel Sensing Intrinsic Amplitude: 18 mV
Lead Channel Sensing Intrinsic Amplitude: 18 mV
Lead Channel Setting Pacing Amplitude: 1.75 V
Lead Channel Setting Pacing Amplitude: 2 V
Lead Channel Setting Pacing Amplitude: 2.25 V
Lead Channel Setting Pacing Pulse Width: 0.4 ms
Lead Channel Setting Pacing Pulse Width: 0.4 ms
Lead Channel Setting Sensing Sensitivity: 0.45 mV
Zone Setting Status: 755011
Zone Setting Status: 755011

## 2023-09-03 ENCOUNTER — Ambulatory Visit (INDEPENDENT_AMBULATORY_CARE_PROVIDER_SITE_OTHER): Payer: Medicare PPO

## 2023-09-03 DIAGNOSIS — I428 Other cardiomyopathies: Secondary | ICD-10-CM

## 2023-09-03 LAB — CUP PACEART REMOTE DEVICE CHECK
Battery Remaining Longevity: 4 mo
Battery Voltage: 2.76 V
Brady Statistic AP VP Percent: 87.22 %
Brady Statistic AP VS Percent: 0.05 %
Brady Statistic AS VP Percent: 12.71 %
Brady Statistic AS VS Percent: 0.02 %
Brady Statistic RA Percent Paced: 87.16 %
Brady Statistic RV Percent Paced: 99.8 %
Date Time Interrogation Session: 20241226091807
HighPow Impedance: 58 Ohm
HighPow Impedance: 87 Ohm
Implantable Lead Connection Status: 753985
Implantable Lead Connection Status: 753985
Implantable Lead Connection Status: 753985
Implantable Lead Implant Date: 20050120
Implantable Lead Implant Date: 20050120
Implantable Lead Implant Date: 20100826
Implantable Lead Location: 753858
Implantable Lead Location: 753859
Implantable Lead Location: 753860
Implantable Lead Model: 4194
Implantable Lead Model: 5076
Implantable Lead Model: 5076
Implantable Pulse Generator Implant Date: 20170106
Lead Channel Impedance Value: 285 Ohm
Lead Channel Impedance Value: 418 Ohm
Lead Channel Impedance Value: 513 Ohm
Lead Channel Impedance Value: 608 Ohm
Lead Channel Impedance Value: 646 Ohm
Lead Channel Impedance Value: 779 Ohm
Lead Channel Pacing Threshold Amplitude: 0.5 V
Lead Channel Pacing Threshold Amplitude: 0.875 V
Lead Channel Pacing Threshold Amplitude: 0.875 V
Lead Channel Pacing Threshold Pulse Width: 0.4 ms
Lead Channel Pacing Threshold Pulse Width: 0.4 ms
Lead Channel Pacing Threshold Pulse Width: 0.4 ms
Lead Channel Sensing Intrinsic Amplitude: 2.75 mV
Lead Channel Sensing Intrinsic Amplitude: 2.75 mV
Lead Channel Sensing Intrinsic Amplitude: 20.625 mV
Lead Channel Sensing Intrinsic Amplitude: 20.625 mV
Lead Channel Setting Pacing Amplitude: 1.75 V
Lead Channel Setting Pacing Amplitude: 2 V
Lead Channel Setting Pacing Amplitude: 2 V
Lead Channel Setting Pacing Pulse Width: 0.4 ms
Lead Channel Setting Pacing Pulse Width: 0.4 ms
Lead Channel Setting Sensing Sensitivity: 0.45 mV
Zone Setting Status: 755011
Zone Setting Status: 755011

## 2023-09-03 NOTE — Progress Notes (Signed)
Remote ICD transmission.   

## 2023-09-03 NOTE — Addendum Note (Signed)
Addended by: Geralyn Flash D on: 09/03/2023 12:12 PM   Modules accepted: Orders, Level of Service

## 2023-09-17 ENCOUNTER — Other Ambulatory Visit: Payer: Self-pay | Admitting: Physician Assistant

## 2023-09-17 ENCOUNTER — Ambulatory Visit
Admission: RE | Admit: 2023-09-17 | Discharge: 2023-09-17 | Disposition: A | Discharge status: Home / Self Care | Payer: Medicare HMO | Source: Ambulatory Visit | Attending: Physician Assistant | Admitting: Physician Assistant

## 2023-09-17 DIAGNOSIS — R1031 Right lower quadrant pain: Secondary | ICD-10-CM

## 2023-09-17 DIAGNOSIS — M79651 Pain in right thigh: Secondary | ICD-10-CM

## 2023-10-01 NOTE — Progress Notes (Signed)
Jonathon Avila Date of Birth: 1956-07-13   History of Present Illness: Jonathon Avila is seen  today for followup of CHF. 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 He is status post biventricular ICD. This did result in significant improvement in LV function. Echo was last check in March 2018 and was unchanged from 2014 with EF 45-50%. He underwent a generator change out of his ICD on 09/14/15 without complications. Last ICD check in December  was satisfactory. He is approaching ERI.  On follow up today he is doing very well[facility] Some limitation due to hip arthritis. No chest pain, dyspnea, palpitations, edema. Weight is stable. Hasn't had to take lasix.      Current Outpatient Medications on File Prior to Visit  Medication Sig Dispense Refill   aspirin EC 81 MG tablet Take 1 tablet (81 mg total) by mouth daily. 90 tablet 3   carvedilol (COREG) 25 MG tablet Take 1 tablet (25 mg total) by mouth 2 (two) times daily with a meal. 180 tablet 3   cholecalciferol (VITAMIN D) 1000 UNITS tablet Take 2,000 Units by mouth 2 (two) times daily.     diclofenac Sodium (VOLTAREN) 1 % GEL Apply 4 g topically as needed.     docusate sodium (COLACE) 100 MG capsule Take 1-2 capsules by mouth 2 (two) times daily as needed for constipation.     fluticasone (FLONASE) 50 MCG/ACT nasal spray Place 2 sprays into the nose daily.     furosemide (LASIX) 40 MG tablet TAKE ONE TABLET BY MOUTH AS NEEDED 30 tablet 1   levocetirizine (XYZAL) 5 MG tablet Take 5 mg by mouth daily.     lisinopril (ZESTRIL) 10 MG tablet Take 1 tablet by mouth daily.     loratadine (CLARITIN) 10 MG tablet Take 1 tablet by mouth daily.     montelukast (SINGULAIR) 10 MG tablet Take 10 mg by mouth at bedtime.     Multiple Vitamins-Minerals (MENS 50+ MULTI VITAMIN/MIN PO) Take by mouth daily.     Peppermint Oil (IBGARD) 90 MG CPCR Take by mouth as needed.     polyethylene glycol  (MIRALAX / GLYCOLAX) 17 g packet Take 17 g by mouth daily as needed. Pt uses 1/2 tsp as needed     simvastatin (ZOCOR) 20 MG tablet Take 1 tablet by mouth daily.     spironolactone (ALDACTONE) 25 MG tablet Take 1 tablet (25 mg total) by mouth at bedtime. 90 tablet 3   tamsulosin (FLOMAX) 0.4 MG CAPS capsule Take 0.4 mg by mouth every evening.     No current facility-administered medications on file prior to visit.    Allergies  Allergen Reactions   Penicillins Anaphylaxis    Other reaction(s): Hypotension   Codeine     Upset stomach    Shellfish-Derived Products Nausea And Vomiting    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    with pacemaker, CRT   LBBB (left bundle branch block)    LV dysfunction    Mitral insufficiency    Nonischemic dilated cardiomyopathy (HCC)    Pulmonary hypertension (HCC)  Past Surgical History:  Procedure Laterality Date   CARDIAC CATHETERIZATION  10/25/2001   EF 65%   CHOLECYSTECTOMY     COLONOSCOPY     EP IMPLANTABLE DEVICE N/A 09/14/2015   Procedure:  ICD Generator Changeout;  Surgeon: Duke Salvia, MD;  Location: [facility] INVASIVE CV LAB;  Service: Cardiovascular;  Laterality: N/A;   ICD     BIVENTRICULAR   POLYPECTOMY     US ECHOCARDIOGRAPHY  02/19/2009   EF 45%   wisdom teeth extraxtion      Social History   Tobacco Use  Smoking Status Never  Smokeless Tobacco Never    Social History   Substance and Sexual Activity  Alcohol Use No    Family History  Problem Relation Age of Onset   COPD Mother        deceased   Colon cancer Father        deceased age 17   Prostate cancer Maternal Grandmother    Esophageal cancer Neg Hx     Pancreatic cancer Neg Hx    Rectal cancer Neg Hx    Stomach cancer Neg Hx    Colon polyps Neg Hx     Review of Systems: As noted in history of present illness.  All other systems were reviewed and are negative.  Physical Exam: BP (!) 104/52 (BP Location: Left Arm, Patient Position: Sitting, Cuff Size: Normal)   Pulse 80   Ht 5\' 7"  (1.702 m)   Wt 181 lb 3.2 oz (82.2 kg)   SpO2 99%   BMI 28.38 kg/m  GENERAL:  Well appearing WM in NAD HEENT:  PERRL, EOMI, sclera are clear. Oropharynx is clear. NECK:  No jugular venous distention, no bruits LUNGS:  Clear to auscultation bilaterally CHEST:  Unremarkable, ICD in left upper chest HEART:  RRR,  PMI not displaced or sustained,S1 and S2 within normal limits, no S3, no S4: no clicks, no rubs, no murmurs EXT:  2 + pulses throughout, no edema,  SKIN:  Warm and dry.  No rashes NEURO:  Alert and oriented x 3. Cranial nerves II through XII intact. PSYCH:  Cognitively intact  LABORATORY DATA:  Lab Results  Component Value Date   WBC 11.2 (H) 09/24/2018   HGB 14.2 09/24/2018   HCT 41.3 09/24/2018   PLT 231 09/24/2018   GLUCOSE 79 04/16/2021   CHOL  10/21/2009    112        ATP III CLASSIFICATION:  <200     mg/dL   Desirable  161-096  mg/dL   Borderline High  >=045    mg/dL   High          TRIG 409 10/21/2009   HDL 34 (L) 10/21/2009   LDLCALC  10/21/2009    52        Total Cholesterol/HDL:CHD Risk Coronary Heart Disease Risk Table                     Men   Women  1/2 Average Risk   3.4   3.3  Average Risk       5.0   4.4  2 X Average Risk   9.6   7.1  3 X Average Risk  23.4   11.0        Use the calculated Patient Ratio above and the CHD Risk Table to determine the patient's CHD Risk.        ATP III CLASSIFICATION (LDL):  <100  mg/dL   Optimal  130-865  mg/dL   Near or Above                    Optimal  130-159  mg/dL   Borderline  784-696  mg/dL   High  >295     mg/dL   Very High   ALT 23 28/41/3244   AST 22  05/13/2015   NA 144 04/16/2021   K 3.8 04/16/2021   CL 104 04/16/2021   CREATININE 0.75 (L) 04/16/2021   BUN 12 04/16/2021   CO2 27 04/16/2021   TSH 0.63 06/16/2019   INR 1.1 ratio (H) 04/26/2009   HGBA1C 5.8 06/24/2019   Labs reviewed from primary care 04/03/16: CMET and CBC normal. Cholesterol 116, trig- 132, HDL 31, LDL 59. BNP 14.1. Jul 04, 2016: A1c 5.7%. Oct 07, 2016 A1c 5.6%.  Dated 04/15/17: glucose 115. A1c 5.6%. Otherwise CMET and CBC normal. Cholesterol 120, triglycerides 109, HDL 35, LDL 63.  Dated 10/05/17: glucose 152, otherwise CBC and CMET normal. Cholesterol 108, triglycerides 152, HDL 32, LDL 46 Dated 04/14/18: cholesterol 116, triglycerides 114, HDL 32, LDL 61. CMET normal.  Dated 06/10/19: normal CBC and CMET.  Dated 01/26/20: cholesterol 110, triglycerides 57, HDL 57, LDL 42.  CBC, CMET, Vit D normal.  Dated 01/02/21: cholesterol 126, triglycerides 47, HDL 56, LDL 59.  Dated 06/26/21: normal CMET and CBC. Dated 11/824: A1c 5.2%, cholesterol 110, triglycerides 76, HDL 43, LDL 51. CMET and CBC normal except plts 122K.    Echo 11/14/16: Study Conclusions   - Left ventricle: The cavity size was normal. Wall thickness was   increased in a pattern of mild LVH. Systolic function was mildly   reduced. The estimated ejection fraction was in the range of 45%   to 50%. Incoordinate septal motion. Doppler parameters are   consistent with abnormal left ventricular relaxation (grade 1   diastolic dysfunction). The E/e&' ratio is between 8-15,   suggesting indeterminate LV filling pressure. - Left atrium: The atrium was normal in size. - Right ventricle: The cavity size was normal. Wall thickness was   normal. Pacer wire or catheter noted in right ventricle. Systolic   function was normal. - Right atrium: The atrium was mildly dilated. Pacer wire or   catheter noted in right atrium. - Atrial septum: No defect or patent foramen ovale was identified. - Tricuspid valve: There was mild  regurgitation. - Pulmonary arteries: PA peak pressure: 27 mm Hg (S). - Inferior vena cava: The vessel was normal in size. The   respirophasic diameter changes were in the normal range (>= 50%),   consistent with normal central venous pressure.   Impressions:   - Compared to a previous study in 2014, there has been no change.  Echo 08/23/20: IMPRESSIONS     1. Left ventricular ejection fraction, by estimation, is 50%. The left  ventricle has mildly decreased function. The left ventricle demonstrates  global hypokinesis. Left ventricular diastolic parameters are consistent  with Grade I diastolic dysfunction  (impaired relaxation).   2. Right ventricular systolic function is normal. The right ventricular  size is normal.   3. Left atrial size was moderately dilated.   4. The mitral valve is grossly normal. Trivial mitral valve  regurgitation.   5. The aortic valve is tricuspid. There is mild calcification of the  aortic valve. Aortic valve regurgitation is not visualized. No aortic  stenosis is present.   Comparison(s): A prior study was performed on  11/14/2016. Similar LV  function from prior.     Assessment / Plan: 1. Chronic systolic congestive heart failure. Last Ejection fraction of 50% in 2021 he is on optimal therapy with spironolactone, Coreg and lisinopril with PRN lasix. Euvolemic. Continue heart healthy diet, low sodium, regular exercise   2. Status post ICD/biventricular pacemaker. Continue followup in EP clinic. Will need generator replacement soon  3.Left bundle branch block.  Follow up in one year

## 2023-10-07 ENCOUNTER — Encounter: Payer: Self-pay | Admitting: Internal Medicine

## 2023-10-08 ENCOUNTER — Ambulatory Visit: Payer: Medicare HMO | Attending: Cardiology | Admitting: Cardiology

## 2023-10-08 ENCOUNTER — Encounter: Payer: Self-pay | Admitting: Cardiology

## 2023-10-08 VITALS — BP 104/52 | HR 80 | Ht 67.0 in | Wt 181.2 lb

## 2023-10-08 DIAGNOSIS — I447 Left bundle-branch block, unspecified: Secondary | ICD-10-CM

## 2023-10-08 DIAGNOSIS — I5022 Chronic systolic (congestive) heart failure: Secondary | ICD-10-CM

## 2023-10-08 DIAGNOSIS — I428 Other cardiomyopathies: Secondary | ICD-10-CM

## 2023-10-08 DIAGNOSIS — Z95810 Presence of automatic (implantable) cardiac defibrillator: Secondary | ICD-10-CM

## 2023-10-08 NOTE — Patient Instructions (Signed)

## 2023-11-05 ENCOUNTER — Ambulatory Visit (INDEPENDENT_AMBULATORY_CARE_PROVIDER_SITE_OTHER): Payer: Medicare PPO

## 2023-11-05 DIAGNOSIS — I428 Other cardiomyopathies: Secondary | ICD-10-CM

## 2023-11-05 LAB — CUP PACEART REMOTE DEVICE CHECK
Battery Remaining Longevity: 2 mo
Battery Voltage: 2.77 V
Brady Statistic AP VP Percent: 81.32 %
Brady Statistic AP VS Percent: 0.05 %
Brady Statistic AS VP Percent: 18.6 %
Brady Statistic AS VS Percent: 0.03 %
Brady Statistic RA Percent Paced: 81.24 %
Brady Statistic RV Percent Paced: 99.77 %
Date Time Interrogation Session: 20250227033324
HighPow Impedance: 49 Ohm
HighPow Impedance: 72 Ohm
Implantable Lead Connection Status: 753985
Implantable Lead Connection Status: 753985
Implantable Lead Connection Status: 753985
Implantable Lead Implant Date: 20050120
Implantable Lead Implant Date: 20050120
Implantable Lead Implant Date: 20100826
Implantable Lead Location: 753858
Implantable Lead Location: 753859
Implantable Lead Location: 753860
Implantable Lead Model: 4194
Implantable Lead Model: 5076
Implantable Lead Model: 5076
Implantable Pulse Generator Implant Date: 20170106
Lead Channel Impedance Value: 342 Ohm
Lead Channel Impedance Value: 361 Ohm
Lead Channel Impedance Value: 456 Ohm
Lead Channel Impedance Value: 532 Ohm
Lead Channel Impedance Value: 551 Ohm
Lead Channel Impedance Value: 722 Ohm
Lead Channel Pacing Threshold Amplitude: 0.5 V
Lead Channel Pacing Threshold Amplitude: 0.875 V
Lead Channel Pacing Threshold Amplitude: 1 V
Lead Channel Pacing Threshold Pulse Width: 0.4 ms
Lead Channel Pacing Threshold Pulse Width: 0.4 ms
Lead Channel Pacing Threshold Pulse Width: 0.4 ms
Lead Channel Sensing Intrinsic Amplitude: 15.25 mV
Lead Channel Sensing Intrinsic Amplitude: 15.25 mV
Lead Channel Sensing Intrinsic Amplitude: 2 mV
Lead Channel Sensing Intrinsic Amplitude: 2 mV
Lead Channel Setting Pacing Amplitude: 1.75 V
Lead Channel Setting Pacing Amplitude: 2 V
Lead Channel Setting Pacing Amplitude: 2 V
Lead Channel Setting Pacing Pulse Width: 0.4 ms
Lead Channel Setting Pacing Pulse Width: 0.4 ms
Lead Channel Setting Sensing Sensitivity: 0.45 mV
Zone Setting Status: 755011
Zone Setting Status: 755011

## 2023-11-09 ENCOUNTER — Telehealth: Payer: Self-pay | Admitting: Internal Medicine

## 2023-11-09 NOTE — Telephone Encounter (Signed)
 Pt called in asking to speak with device nurse about his battery life.

## 2023-11-09 NOTE — Telephone Encounter (Signed)
 Called patient to advise his battery has estimate 2 months left on battery. Pt appreciative of call.

## 2023-11-24 ENCOUNTER — Encounter: Payer: Self-pay | Admitting: Internal Medicine

## 2023-11-30 ENCOUNTER — Encounter: Payer: Self-pay | Admitting: Cardiology

## 2023-11-30 MED ORDER — SPIRONOLACTONE 25 MG PO TABS: 25.0000 mg | ORAL_TABLET | Freq: Every day | ORAL | 2 refills | Status: DC

## 2023-12-04 ENCOUNTER — Telehealth: Payer: Self-pay

## 2023-12-04 NOTE — Telephone Encounter (Signed)
 Alert received from [employer]:  Alert remote transmission:  RRT reached 3/28 - route to triage  Outreach made to Pt.  Advised Pt of RRT.  Pt heard the alert this morning.  Pt scheduled to discuss gen change December 24, 2023 at 8:00 am with Canary Brim.  Pt states the alarm does not bother him.  He declines device clinic appointment to disable.  Pt requests that his procedure be scheduled prior to Jan 21, 2024 if possible, as he is going on vacation.  Will advise scheduling.

## 2023-12-07 ENCOUNTER — Ambulatory Visit (INDEPENDENT_AMBULATORY_CARE_PROVIDER_SITE_OTHER): Payer: Medicare PPO

## 2023-12-07 DIAGNOSIS — I428 Other cardiomyopathies: Secondary | ICD-10-CM | POA: Diagnosis not present

## 2023-12-07 DIAGNOSIS — I5022 Chronic systolic (congestive) heart failure: Secondary | ICD-10-CM

## 2023-12-07 NOTE — Progress Notes (Signed)
 Remote ICD transmission.

## 2023-12-08 LAB — CUP PACEART REMOTE DEVICE CHECK
Battery Remaining Longevity: 1 mo
Battery Voltage: 2.72 V
Brady Statistic AP VP Percent: 63.71 %
Brady Statistic AP VS Percent: 0.04 %
Brady Statistic AS VP Percent: 36.2 %
Brady Statistic AS VS Percent: 0.06 %
Brady Statistic RA Percent Paced: 63.62 %
Brady Statistic RV Percent Paced: 99.71 %
Date Time Interrogation Session: 20250331031708
HighPow Impedance: 50 Ohm
HighPow Impedance: 76 Ohm
Implantable Lead Connection Status: 753985
Implantable Lead Connection Status: 753985
Implantable Lead Connection Status: 753985
Implantable Lead Implant Date: 20050120
Implantable Lead Implant Date: 20050120
Implantable Lead Implant Date: 20100826
Implantable Lead Location: 753858
Implantable Lead Location: 753859
Implantable Lead Location: 753860
Implantable Lead Model: 4194
Implantable Lead Model: 5076
Implantable Lead Model: 5076
Implantable Pulse Generator Implant Date: 20170106
Lead Channel Impedance Value: 304 Ohm
Lead Channel Impedance Value: 361 Ohm
Lead Channel Impedance Value: 456 Ohm
Lead Channel Impedance Value: 551 Ohm
Lead Channel Impedance Value: 551 Ohm
Lead Channel Impedance Value: 760 Ohm
Lead Channel Pacing Threshold Amplitude: 0.5 V
Lead Channel Pacing Threshold Amplitude: 0.75 V
Lead Channel Pacing Threshold Amplitude: 0.75 V
Lead Channel Pacing Threshold Pulse Width: 0.4 ms
Lead Channel Pacing Threshold Pulse Width: 0.4 ms
Lead Channel Pacing Threshold Pulse Width: 0.4 ms
Lead Channel Sensing Intrinsic Amplitude: 16.625 mV
Lead Channel Sensing Intrinsic Amplitude: 16.625 mV
Lead Channel Sensing Intrinsic Amplitude: 2.5 mV
Lead Channel Sensing Intrinsic Amplitude: 2.5 mV
Lead Channel Setting Pacing Amplitude: 1.5 V
Lead Channel Setting Pacing Amplitude: 1.75 V
Lead Channel Setting Pacing Amplitude: 2 V
Lead Channel Setting Pacing Pulse Width: 0.4 ms
Lead Channel Setting Pacing Pulse Width: 0.4 ms
Lead Channel Setting Sensing Sensitivity: 0.45 mV
Zone Setting Status: 755011
Zone Setting Status: 755011

## 2023-12-22 NOTE — Progress Notes (Unsigned)
  Electrophysiology Office Note:   Date:  12/24/2023  ID:  Jonathon Avila, DOB 09-21-1955, MRN [id number]  Primary Cardiologist: Peter Swaziland, MD Primary Heart Failure: None Electrophysiologist: Richardo Chandler, MD       History of Present Illness:   Jonathon Avila is a 68 y.o. male with h/o NICM s/p CRT-D, LBBB, HTN, HLD, pulmonary HTN seen today for routine electrophysiology followup.   Since last being seen in our clinic the patient reports doing well. He states he has had many generator changes since his first and is familiar with the process  Jonathon Avila accompanies him at visit. Asks for alert to be turned off for battery.    He denies chest pain, palpitations, dyspnea, PND, orthopnea, nausea, vomiting, dizziness, syncope, edema, weight gain, or early satiety.   Review of systems complete and found to be negative unless listed in HPI.   EP Information / Studies Reviewed:    EKG is ordered today. Personal review as below.  EKG Interpretation Date/Time:  Thursday December 24 2023 07:59:52 EDT Ventricular Rate:  61 PR Interval:  114 QRS Duration:  188 QT Interval:  476 QTC Calculation: 479 R Axis:   268  Text Interpretation: AV dual-paced rhythm Biventricular pacemaker detected Confirmed by Creighton Doffing ([id number]) on 12/24/2023 11:54:46 AM   ICD Interrogation-  reviewed in detail today,  See PACEART report.  Device History: Medtronic BiV ICD implanted 09/14/2015 for NICM.  Prior dual chamber PPM implanted 09/28/03, 05/03/2009 RV lead revision History of appropriate therapy: No History of AAD therapy: No          Physical Exam:   VS:  BP 122/66   Pulse 77   Ht 5\' 7"  (1.702 m)   Wt 180 lb 3.2 oz (81.7 kg)   SpO2 97%   BMI 28.22 kg/m    Wt Readings from Last 3 Encounters:  12/24/23 180 lb 3.2 oz (81.7 kg)  10/08/23 181 lb 3.2 oz (82.2 kg)  03/17/23 182 lb 12.8 oz (82.9 kg)     GEN: Well nourished, well developed in no acute distress NECK: No JVD; No carotid  bruits CARDIAC: Regular rate and rhythm, no murmurs, rubs, gallops RESPIRATORY:  Clear to auscultation without rales, wheezing or rhonchi  ABDOMEN: Soft, non-tender, non-distended EXTREMITIES:  No edema; No deformity   ASSESSMENT AND PLAN:    Chronic Systolic Dysfunction / NICM s/p Medtronic CRT-D  LBBB -euvolemic today -Stable on an appropriate medical regimen -BiV pacing 99.7%  -QRS -Normal ICD function -See Pace Art report -No changes today -Planned generator change 12/29/23 -update procedural labs  -procedure details discussed with family / patient, instructions given  Hypertension  -well controlled on current regimen     Disposition:   Follow up with Dr. Rodolfo Clan  as planned for generator change on 12/29/23    Signed, Creighton Doffing, NP-C, AGACNP-BC [facility] - Electrophysiology  12/24/2023, 11:58 AM

## 2023-12-22 NOTE — H&P (View-Only) (Signed)
  Electrophysiology Office Note:   Date:  12/24/2023  ID:  Jonathon Avila, DOB 09-21-1955, MRN [id number]  Primary Cardiologist: Peter Swaziland, MD Primary Heart Failure: None Electrophysiologist: Richardo Chandler, MD       History of Present Illness:   Jonathon Avila is a 68 y.o. male with h/o NICM s/p CRT-D, LBBB, HTN, HLD, pulmonary HTN seen today for routine electrophysiology followup.   Since last being seen in our clinic the patient reports doing well. He states he has had many generator changes since his first and is familiar with the process  Wife accompanies him at visit. Asks for alert to be turned off for battery.    He denies chest pain, palpitations, dyspnea, PND, orthopnea, nausea, vomiting, dizziness, syncope, edema, weight gain, or early satiety.   Review of systems complete and found to be negative unless listed in HPI.   EP Information / Studies Reviewed:    EKG is ordered today. Personal review as below.  EKG Interpretation Date/Time:  Thursday December 24 2023 07:59:52 EDT Ventricular Rate:  61 PR Interval:  114 QRS Duration:  188 QT Interval:  476 QTC Calculation: 479 R Axis:   268  Text Interpretation: AV dual-paced rhythm Biventricular pacemaker detected Confirmed by Creighton Doffing ([id number]) on 12/24/2023 11:54:46 AM   ICD Interrogation-  reviewed in detail today,  See PACEART report.  Device History: Medtronic BiV ICD implanted 09/14/2015 for NICM.  Prior dual chamber PPM implanted 09/28/03, 05/03/2009 RV lead revision History of appropriate therapy: No History of AAD therapy: No          Physical Exam:   VS:  BP 122/66   Pulse 77   Ht 5\' 7"  (1.702 m)   Wt 180 lb 3.2 oz (81.7 kg)   SpO2 97%   BMI 28.22 kg/m    Wt Readings from Last 3 Encounters:  12/24/23 180 lb 3.2 oz (81.7 kg)  10/08/23 181 lb 3.2 oz (82.2 kg)  03/17/23 182 lb 12.8 oz (82.9 kg)     GEN: Well nourished, well developed in no acute distress NECK: No JVD; No carotid  bruits CARDIAC: Regular rate and rhythm, no murmurs, rubs, gallops RESPIRATORY:  Clear to auscultation without rales, wheezing or rhonchi  ABDOMEN: Soft, non-tender, non-distended EXTREMITIES:  No edema; No deformity   ASSESSMENT AND PLAN:    Chronic Systolic Dysfunction / NICM s/p Medtronic CRT-D  LBBB -euvolemic today -Stable on an appropriate medical regimen -BiV pacing 99.7%  -QRS -Normal ICD function -See Pace Art report -No changes today -Planned generator change 12/29/23 -update procedural labs  -procedure details discussed with family / patient, instructions given  Hypertension  -well controlled on current regimen     Disposition:   Follow up with Dr. Rodolfo Clan  as planned for generator change on 12/29/23    Signed, Creighton Doffing, NP-C, AGACNP-BC [facility] - Electrophysiology  12/24/2023, 11:58 AM

## 2023-12-23 ENCOUNTER — Telehealth: Payer: Self-pay

## 2023-12-23 NOTE — Telephone Encounter (Signed)
 Called pt and informed him that per Dr. Rodolfo Clan we would like to move his ICD Gen change from 4/25 to 4/22. He was ok with that. He has an appointment with [facility] tomorrow and we will go over all instructions with him at that time.

## 2023-12-24 ENCOUNTER — Encounter: Payer: Self-pay | Admitting: Pulmonary Disease

## 2023-12-24 ENCOUNTER — Ambulatory Visit: Attending: Pulmonary Disease | Admitting: Pulmonary Disease

## 2023-12-24 VITALS — BP 122/66 | HR 77 | Ht 67.0 in | Wt 180.2 lb

## 2023-12-24 DIAGNOSIS — Z95810 Presence of automatic (implantable) cardiac defibrillator: Secondary | ICD-10-CM

## 2023-12-24 DIAGNOSIS — I447 Left bundle-branch block, unspecified: Secondary | ICD-10-CM | POA: Diagnosis not present

## 2023-12-24 DIAGNOSIS — I10 Essential (primary) hypertension: Secondary | ICD-10-CM

## 2023-12-24 DIAGNOSIS — I5022 Chronic systolic (congestive) heart failure: Secondary | ICD-10-CM

## 2023-12-24 DIAGNOSIS — I428 Other cardiomyopathies: Secondary | ICD-10-CM

## 2023-12-24 LAB — CUP PACEART INCLINIC DEVICE CHECK
Date Time Interrogation Session: 20250417115821
Implantable Lead Connection Status: 753985
Implantable Lead Connection Status: 753985
Implantable Lead Connection Status: 753985
Implantable Lead Implant Date: 20050120
Implantable Lead Implant Date: 20050120
Implantable Lead Implant Date: 20100826
Implantable Lead Location: 753858
Implantable Lead Location: 753859
Implantable Lead Location: 753860
Implantable Lead Model: 4194
Implantable Lead Model: 5076
Implantable Lead Model: 5076
Implantable Pulse Generator Implant Date: 20170106

## 2023-12-24 NOTE — Patient Instructions (Signed)
 Medication Instructions:   Your physician recommends that you continue on your current medications as directed. Please refer to the Current Medication list given to you today.  *If you need a refill on your cardiac medications before your next appointment, please call your pharmacy*  Lab Work:   PLEASE GO DOWN STAIRS  [facility]  FIRST FLOOR  [address] ( GET OFF ELEVATORS MAKE A LEFT AND ANOTHER LEFT LAB ON RIGHT DOWN HALLWAY :  BMET CBC   If you have labs (blood work) drawn today and your tests are completely normal, you will receive your results only by: MyChart Message (if you have MyChart) OR A paper copy in the mail If you have any lab test that is abnormal or we need to change your treatment, we will call you to review the results.  Testing/Procedures:  SEE LETTER  ATTACHED FOR  DEVICE CHANGE   Follow-Up: At [facility], you and your health needs are our priority.  As part of our continuing mission to provide you with exceptional heart care, our providers are all part of one team.  This team includes your primary Cardiologist (physician) and Advanced Practice Providers or APPs (Physician Assistants and Nurse Practitioners) who all work together to provide you with the care you need, when you need it.  Your next appointment:  YOU WILL BE CONTACTED FOR FOLLOW UP APPOINTMENT    We recommend signing up for the patient portal called "MyChart".  Sign up information is provided on this After Visit Summary.  MyChart is used to connect with patients for Virtual Visits (Telemedicine).  Patients are able to view lab/test results, encounter notes, upcoming appointments, etc.  Non-urgent messages can be sent to your provider as well.   To learn more about what you can do with MyChart, go to [email or web address].    Other Instructions        1st Floor: - Lobby - Registration  - Pharmacy  - Lab - Cafe  2nd Floor: - PV Lab - Diagnostic Testing (echo, CT, nuclear  med)  3rd Floor: - Vacant  4th Floor: - TCTS (cardiothoracic surgery) - AFib Clinic - Structural Heart Clinic - Vascular Surgery  - Vascular Ultrasound  5th Floor: - HeartCare Cardiology (general and EP) - Clinical Pharmacy for coumadin, hypertension, lipid, weight-loss medications, and med management appointments    Valet parking services will be available as well.

## 2023-12-25 LAB — BASIC METABOLIC PANEL WITH GFR
BUN/Creatinine Ratio: 14 (ref 10–24)
BUN: 11 mg/dL (ref 8–27)
CO2: 26 mmol/L (ref 20–29)
Calcium: 9.3 mg/dL (ref 8.6–10.2)
Chloride: 102 mmol/L (ref 96–106)
Creatinine, Ser: 0.81 mg/dL (ref 0.76–1.27)
Glucose: 85 mg/dL (ref 70–99)
Potassium: 4.5 mmol/L (ref 3.5–5.2)
Sodium: 141 mmol/L (ref 134–144)
eGFR: 97 mL/min/{1.73_m2} (ref >59)

## 2023-12-25 LAB — CBC
Hematocrit: 41.6 % (ref 37.5–51.0)
Hemoglobin: 13.9 g/dL (ref 13.0–17.7)
MCH: 30.9 pg (ref 26.6–33.0)
MCHC: 33.4 g/dL (ref 31.5–35.7)
MCV: 92 fL (ref 79–97)
Platelets: 120 10*3/uL — ABNORMAL LOW (ref 150–450)
RBC: 4.5 x10E6/uL (ref 4.14–5.80)
RDW: 12.7 % (ref 11.6–15.4)
WBC: 5.4 10*3/uL (ref 3.4–10.8)

## 2023-12-26 ENCOUNTER — Encounter: Payer: Self-pay | Admitting: Internal Medicine

## 2023-12-28 ENCOUNTER — Other Ambulatory Visit: Payer: Self-pay | Admitting: *Deleted

## 2023-12-28 DIAGNOSIS — Z79899 Other long term (current) drug therapy: Secondary | ICD-10-CM

## 2023-12-28 DIAGNOSIS — Z95810 Presence of automatic (implantable) cardiac defibrillator: Secondary | ICD-10-CM

## 2023-12-28 NOTE — Pre-Procedure Instructions (Signed)
 Instructed patient on the following items: Arrival time 0730 Nothing to eat or drink after midnight No meds AM of procedure Responsible person to drive you home and stay with you for 24 hrs Wash with special soap night before and morning of procedure

## 2023-12-29 ENCOUNTER — Ambulatory Visit (HOSPITAL_COMMUNITY)
Admission: RE | Admit: 2023-12-29 | Discharge: 2023-12-29 | Disposition: A | Discharge status: Home / Self Care | Attending: Internal Medicine | Admitting: Internal Medicine

## 2023-12-29 ENCOUNTER — Encounter (HOSPITAL_COMMUNITY): Payer: Self-pay | Admitting: Internal Medicine

## 2023-12-29 ENCOUNTER — Ambulatory Visit (HOSPITAL_COMMUNITY)
Admission: RE | Disposition: A | Discharge status: Home / Self Care | Payer: Self-pay | Source: Home / Self Care | Attending: Internal Medicine

## 2023-12-29 ENCOUNTER — Other Ambulatory Visit: Payer: Self-pay

## 2023-12-29 DIAGNOSIS — I272 Pulmonary hypertension, unspecified: Secondary | ICD-10-CM | POA: Insufficient documentation

## 2023-12-29 DIAGNOSIS — I447 Left bundle-branch block, unspecified: Secondary | ICD-10-CM | POA: Insufficient documentation

## 2023-12-29 DIAGNOSIS — I428 Other cardiomyopathies: Secondary | ICD-10-CM | POA: Diagnosis not present

## 2023-12-29 DIAGNOSIS — I110 Hypertensive heart disease with heart failure: Secondary | ICD-10-CM | POA: Insufficient documentation

## 2023-12-29 DIAGNOSIS — I429 Cardiomyopathy, unspecified: Secondary | ICD-10-CM

## 2023-12-29 DIAGNOSIS — I5022 Chronic systolic (congestive) heart failure: Secondary | ICD-10-CM | POA: Insufficient documentation

## 2023-12-29 DIAGNOSIS — E785 Hyperlipidemia, unspecified: Secondary | ICD-10-CM | POA: Diagnosis not present

## 2023-12-29 DIAGNOSIS — Z79899 Other long term (current) drug therapy: Secondary | ICD-10-CM | POA: Diagnosis not present

## 2023-12-29 DIAGNOSIS — Z4502 Encounter for adjustment and management of automatic implantable cardiac defibrillator: Secondary | ICD-10-CM | POA: Diagnosis not present

## 2023-12-29 DIAGNOSIS — I5020 Unspecified systolic (congestive) heart failure: Secondary | ICD-10-CM

## 2023-12-29 HISTORY — PX: BIV ICD GENERATOR CHANGEOUT: EP1194

## 2023-12-29 LAB — CBC
HCT: 44.5 % (ref 39.0–52.0)
Hemoglobin: 15.1 g/dL (ref 13.0–17.0)
MCH: 30.3 pg (ref 26.0–34.0)
MCHC: 33.9 g/dL (ref 30.0–36.0)
MCV: 89.2 fL (ref 80.0–100.0)
Platelets: 141 10*3/uL — ABNORMAL LOW (ref 150–400)
RBC: 4.99 MIL/uL (ref 4.22–5.81)
RDW: 13.1 % (ref 11.5–15.5)
WBC: 6.7 10*3/uL (ref 4.0–10.5)
nRBC: 0 % (ref 0.0–0.2)

## 2023-12-29 SURGERY — BIV ICD GENERATOR CHANGEOUT

## 2023-12-29 MED ORDER — FENTANYL CITRATE (PF) 100 MCG/2ML IJ SOLN
INTRAMUSCULAR | Status: DC | PRN
  Administered 2023-12-29: 25 ug via INTRAVENOUS
  Administered 2023-12-29: 50 ug via INTRAVENOUS

## 2023-12-29 MED ORDER — LIDOCAINE HCL (PF) 1 % IJ SOLN
INTRAMUSCULAR | Status: AC
  Filled 2023-12-29: qty 30

## 2023-12-29 MED ORDER — SODIUM CHLORIDE 0.9% FLUSH: 3.0000 mL | Freq: Two times a day (BID) | INTRAVENOUS | Status: DC

## 2023-12-29 MED ORDER — SODIUM CHLORIDE 0.9 % IV SOLN
80.0000 mg | INTRAVENOUS | Status: AC
  Administered 2023-12-29: 80 mg

## 2023-12-29 MED ORDER — LIDOCAINE HCL (PF) 1 % IJ SOLN
INTRAMUSCULAR | Status: DC | PRN
  Administered 2023-12-29: 60 mL

## 2023-12-29 MED ORDER — VANCOMYCIN HCL IN DEXTROSE 1-5 GM/200ML-% IV SOLN
INTRAVENOUS | Status: AC
  Filled 2023-12-29: qty 200

## 2023-12-29 MED ORDER — FENTANYL CITRATE (PF) 100 MCG/2ML IJ SOLN
INTRAMUSCULAR | Status: AC
  Filled 2023-12-29: qty 2

## 2023-12-29 MED ORDER — SODIUM CHLORIDE 0.9 % IV SOLN
INTRAVENOUS | Status: AC
  Filled 2023-12-29: qty 2

## 2023-12-29 MED ORDER — ACETAMINOPHEN 325 MG PO TABS: 325.0000 mg | ORAL_TABLET | ORAL | Status: DC | PRN

## 2023-12-29 MED ORDER — MIDAZOLAM HCL 2 MG/2ML IJ SOLN
INTRAMUSCULAR | Status: AC
  Filled 2023-12-29: qty 2

## 2023-12-29 MED ORDER — VANCOMYCIN HCL IN DEXTROSE 1-5 GM/200ML-% IV SOLN
1000.0000 mg | INTRAVENOUS | Status: AC
  Administered 2023-12-29: 1000 mg via INTRAVENOUS

## 2023-12-29 MED ORDER — SODIUM CHLORIDE 0.9 % IV SOLN: INTRAVENOUS | Status: DC

## 2023-12-29 MED ORDER — SODIUM CHLORIDE 0.9 % IV SOLN: 250.0000 mL | INTRAVENOUS | Status: DC

## 2023-12-29 MED ORDER — POVIDONE-IODINE 10 % EX SWAB: 2.0000 | Freq: Once | CUTANEOUS | Status: DC

## 2023-12-29 MED ORDER — MIDAZOLAM HCL 2 MG/2ML IJ SOLN
INTRAMUSCULAR | Status: DC | PRN
  Administered 2023-12-29: 2 mg via INTRAVENOUS
  Administered 2023-12-29: 1 mg via INTRAVENOUS

## 2023-12-29 MED ORDER — CHLORHEXIDINE GLUCONATE 4 % EX SOLN: 4.0000 | Freq: Once | CUTANEOUS | Status: DC

## 2023-12-29 MED ORDER — SODIUM CHLORIDE 0.9% FLUSH: 3.0000 mL | INTRAVENOUS | Status: DC | PRN

## 2023-12-29 SURGICAL SUPPLY — 6 items
DEVICE DISSECT PLASMABLAD 3.0S (MISCELLANEOUS) IMPLANT
HEMOSTAT SURGICEL 2X4 FIBR (HEMOSTASIS) IMPLANT
ICD COBALT XT CRT DTPA2D1 (ICD Generator) IMPLANT
POUCH AIGIS-R ANTIBACT ICD (Mesh General) ×1 IMPLANT
POUCH AIGIS-R ANTIBACT ICD LRG (Mesh General) IMPLANT
TRAY PACEMAKER INSERTION (PACKS) IMPLANT

## 2023-12-29 NOTE — Interval H&P Note (Signed)
 History and Physical Interval Note:  12/29/2023 7:55 AM  Jonathon Avila  has presented today for surgery, with the diagnosis of eri.  The various methods of treatment have been discussed with the patient and family. After consideration of risks, benefits and other options for treatment, the patient has consented to  Procedure(s): BIV ICD GENERATOR CHANGEOUT (N/A) as a surgical intervention.  The patient's history has been reviewed, patient examined, no change in status, stable for surgery.  I have reviewed the patient's chart and labs.  Questions were answered to the patient's satisfaction.     Richardo Chandler  We have reviewed the benefits and risks of generator replacement.  These include but are not limited to lead fracture and infection.  The patient understands, agrees and is willing to proceed.    Stable symptoms

## 2023-12-29 NOTE — Discharge Instructions (Addendum)

## 2023-12-30 MED FILL — Lidocaine HCl Local Preservative Free (PF) Inj 1%: INTRAMUSCULAR | Qty: 30 | Status: AC

## 2024-01-13 ENCOUNTER — Ambulatory Visit: Attending: Cardiovascular Disease

## 2024-01-13 DIAGNOSIS — I429 Cardiomyopathy, unspecified: Secondary | ICD-10-CM

## 2024-01-13 DIAGNOSIS — I5020 Unspecified systolic (congestive) heart failure: Secondary | ICD-10-CM

## 2024-01-13 NOTE — Patient Instructions (Addendum)
   After Your ICD (Implantable Cardiac Defibrillator)    Monitor your defibrillator site for redness, swelling, and drainage. Call the device clinic at [phone] if you experience these symptoms or fever/chills.  You may shower and wash with a clean wash cloth and warm soapy water.  You may use a hot tub or a pool after your wound check appointment if the incision is completely closed.  Your ICD is designed to protect you from life threatening heart rhythms. Because of this, you may receive a shock.   1 shock with no symptoms:  Call the office during business hours. 1 shock with symptoms (chest pain, chest pressure, dizziness, lightheadedness, shortness of breath, overall feeling unwell):  Call 911. If you experience 2 or more shocks in 24 hours:  Call 911. If you receive a shock, you should not drive.  [place] DMV - no driving for 6 months if you receive appropriate therapy from your ICD.   ICD Alerts:  Some alerts are vibratory and others beep. These are NOT emergencies. Please call our office to let us  know. If this occurs at night or on weekends, it can wait until the next business day. Send a remote transmission.  If your device is capable of reading fluid status (for heart failure), you will be offered monthly monitoring to review this with you.   Remote monitoring is used to monitor your ICD from home. This monitoring is scheduled every 91 days by our office. It allows us  to keep an eye on the functioning of your device to ensure it is working properly. You will routinely see your Electrophysiologist annually (more often if necessary).

## 2024-01-14 NOTE — Progress Notes (Signed)
 Normal dual chamber pacemaker wound check. Presenting rhythm: AS/VS. Wound well healed. Routine testing performed. Thresholds, sensing, and impedances consistent with implant measurements. RA output programmed from 2.5V to 2.0V.  No episodes. Pt enrolled in remote follow-up.

## 2024-01-22 NOTE — Progress Notes (Signed)
 Remote ICD transmission.

## 2024-01-22 NOTE — Addendum Note (Signed)
 Addended by: Edra Govern D on: 01/22/2024 01:35 PM   Modules accepted: Orders

## 2024-03-22 NOTE — Progress Notes (Unsigned)
  Electrophysiology Office Note:   Date:  03/30/2024  ID:  Jonathon Avila, DOB Dec 25, 1955, MRN [id number]  Primary Cardiologist: Peter Swaziland, MD Primary Heart Failure: None Electrophysiologist: Fonda Kitty, MD       History of Present Illness:   Jonathon Avila is a 68 y.o. male with h/o HFrEF / ICM s/p CRT-D, LBBB, HTN, MV regurgitation, pulmonary HTN seen today for routine electrophysiology follow-up s/p post generator change.  Since last being seen in our clinic the patient reports doing well. He describes an event where he was stung by hornets and had swelling recently.  No device related concerns.  He denies chest pain, palpitations, dyspnea, PND, orthopnea, nausea, vomiting, dizziness, syncope, edema, weight gain, or early satiety.    Review of systems complete and found to be negative unless listed in HPI.    EP Information / Studies Reviewed:    EKG is not ordered today. EKG from 12/24/23 reviewed which showed AV dual paced rhythm, upright in V1 and lead I, QRS 188 ms      ICD Interrogation-  reviewed in detail today,  See PACEART report.  Device History: Medtronic BiV ICD implanted 09/28/03, RV 05/03/09 lead revision for HFrEF  Generator Change > 09/14/15, 12/29/23 History of appropriate therapy: No History of AAD therapy: No   Risk Assessment/Calculations:              Physical Exam:   VS:  BP 100/60 (BP Location: Left Arm, Patient Position: Sitting, Cuff Size: Normal)   Pulse 70   Ht 5' 7 (1.702 m)   Wt 164 lb (74.4 kg)   SpO2 96%   BMI 25.69 kg/m    Wt Readings from Last 3 Encounters:  03/30/24 164 lb (74.4 kg)  12/29/23 172 lb (78 kg)  12/24/23 180 lb 3.2 oz (81.7 kg)     GEN: Well nourished, well developed in no acute distress NECK: No JVD; No carotid bruits CARDIAC: Regular rate and rhythm, no murmurs, rubs, gallops RESPIRATORY:  Clear to auscultation without rales, wheezing or rhonchi  ABDOMEN: Soft, non-tender, non-distended EXTREMITIES:   No edema; No deformity   ASSESSMENT AND PLAN:    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  Unclear if this could have been related to recent multiple hornet stings (had facial, hand swelling) -Stable on an appropriate medical regimen -Normal ICD function -99.5% BiV pacing  -See Pace Art report -No changes today  Hypertension  -well controlled on current regimen    Disposition:   Follow up with EP APP in 6 months > discussed transition to new EP MD with patient.    Signed, Daphne Barrack, NP-C, AGACNP-BC [facility] - Electrophysiology  03/30/2024, 10:55 AM

## 2024-03-30 ENCOUNTER — Ambulatory Visit: Attending: Pulmonary Disease | Admitting: Pulmonary Disease

## 2024-03-30 ENCOUNTER — Ambulatory Visit: Payer: Self-pay | Admitting: Cardiology

## 2024-03-30 ENCOUNTER — Ambulatory Visit

## 2024-03-30 ENCOUNTER — Encounter: Payer: Self-pay | Admitting: Cardiology

## 2024-03-30 ENCOUNTER — Encounter: Payer: Self-pay | Admitting: Pulmonary Disease

## 2024-03-30 VITALS — BP 100/60 | HR 70 | Ht 67.0 in | Wt 164.0 lb

## 2024-03-30 DIAGNOSIS — I447 Left bundle-branch block, unspecified: Secondary | ICD-10-CM

## 2024-03-30 DIAGNOSIS — I10 Essential (primary) hypertension: Secondary | ICD-10-CM

## 2024-03-30 DIAGNOSIS — I428 Other cardiomyopathies: Secondary | ICD-10-CM

## 2024-03-30 DIAGNOSIS — Z95810 Presence of automatic (implantable) cardiac defibrillator: Secondary | ICD-10-CM | POA: Diagnosis not present

## 2024-03-30 DIAGNOSIS — I5022 Chronic systolic (congestive) heart failure: Secondary | ICD-10-CM | POA: Diagnosis not present

## 2024-03-30 LAB — CUP PACEART INCLINIC DEVICE CHECK
Date Time Interrogation Session: 20250723105019
Implantable Lead Connection Status: 753985
Implantable Lead Connection Status: 753985
Implantable Lead Connection Status: 753985
Implantable Lead Implant Date: 20050120
Implantable Lead Implant Date: 20050120
Implantable Lead Implant Date: 20100826
Implantable Lead Location: 753858
Implantable Lead Location: 753859
Implantable Lead Location: 753860
Implantable Lead Model: 4194
Implantable Lead Model: 5076
Implantable Lead Model: 5076
Implantable Pulse Generator Implant Date: 20250422

## 2024-03-30 NOTE — Patient Instructions (Signed)
 Medication Instructions:  Your physician recommends that you continue on your current medications as directed. Please refer to the Current Medication list given to you today.  *If you need a refill on your cardiac medications before your next appointment, please call your pharmacy*  Lab Work: None ordered If you have labs (blood work) drawn today and your tests are completely normal, you will receive your results only by: MyChart Message (if you have MyChart) OR A paper copy in the mail If you have any lab test that is abnormal or we need to change your treatment, we will call you to review the results.  Follow-Up: At [facility], you and your health needs are our priority.  As part of our continuing mission to provide you with exceptional heart care, our providers are all part of one team.  This team includes your primary Cardiologist (physician) and Advanced Practice Providers or APPs (Physician Assistants and Nurse Practitioners) who all work together to provide you with the care you need, when you need it.  Your next appointment:   6 month(s)  Provider:   You will see one of the following Advanced Practice Providers on your designated Care Team:   Charlies Arthur, PA-C Ozell Jodie Passey, PA-C Suzann Riddle, NP

## 2024-03-31 LAB — CUP PACEART REMOTE DEVICE CHECK
Battery Remaining Longevity: 115 mo
Battery Voltage: 3.07 V
Brady Statistic AP VP Percent: 85.01 %
Brady Statistic AP VS Percent: 0.05 %
Brady Statistic AS VP Percent: 14.62 %
Brady Statistic AS VS Percent: 0.32 %
Brady Statistic RA Percent Paced: 85.19 %
Brady Statistic RV Percent Paced: 99.63 %
Date Time Interrogation Session: 20250723045757
HighPow Impedance: 48 Ohm
HighPow Impedance: 75 Ohm
Implantable Lead Connection Status: 753985
Implantable Lead Connection Status: 753985
Implantable Lead Connection Status: 753985
Implantable Lead Implant Date: 20050120
Implantable Lead Implant Date: 20050120
Implantable Lead Implant Date: 20100826
Implantable Lead Location: 753858
Implantable Lead Location: 753859
Implantable Lead Location: 753860
Implantable Lead Model: 4194
Implantable Lead Model: 5076
Implantable Lead Model: 5076
Implantable Pulse Generator Implant Date: 20250422
Lead Channel Impedance Value: 285 Ohm
Lead Channel Impedance Value: 418 Ohm
Lead Channel Impedance Value: 494 Ohm
Lead Channel Impedance Value: 570 Ohm
Lead Channel Impedance Value: 627 Ohm
Lead Channel Impedance Value: 760 Ohm
Lead Channel Pacing Threshold Amplitude: 0.625 V
Lead Channel Pacing Threshold Amplitude: 0.75 V
Lead Channel Pacing Threshold Amplitude: 0.875 V
Lead Channel Pacing Threshold Pulse Width: 0.4 ms
Lead Channel Pacing Threshold Pulse Width: 0.4 ms
Lead Channel Pacing Threshold Pulse Width: 0.4 ms
Lead Channel Sensing Intrinsic Amplitude: 15.3 mV
Lead Channel Sensing Intrinsic Amplitude: 2.9 mV
Lead Channel Setting Pacing Amplitude: 1.5 V
Lead Channel Setting Pacing Amplitude: 1.5 V
Lead Channel Setting Pacing Amplitude: 2 V
Lead Channel Setting Pacing Pulse Width: 0.4 ms
Lead Channel Setting Pacing Pulse Width: 0.4 ms
Lead Channel Setting Sensing Sensitivity: 0.45 mV
Zone Setting Status: 755011
Zone Setting Status: 755011

## 2024-03-31 MED ORDER — FUROSEMIDE 40 MG PO TABS: 40.0000 mg | ORAL_TABLET | Freq: Every day | ORAL | 1 refills | Status: AC | PRN

## 2024-04-03 ENCOUNTER — Ambulatory Visit: Payer: Self-pay | Admitting: Cardiology

## 2024-04-10 ENCOUNTER — Encounter: Payer: Self-pay | Admitting: Cardiology

## 2024-04-11 ENCOUNTER — Other Ambulatory Visit: Payer: Self-pay | Admitting: Cardiology

## 2024-04-11 MED ORDER — CARVEDILOL 25 MG PO TABS: 25.0000 mg | ORAL_TABLET | Freq: Two times a day (BID) | ORAL | 3 refills | Status: AC

## 2024-04-11 NOTE — Addendum Note (Signed)
 Addended by: CHAUVIGNE, Eragon Hammond on: 04/11/2024 12:11 PM   Modules accepted: Orders

## 2024-06-10 NOTE — Progress Notes (Signed)
 Remote ICD Transmission

## 2024-06-29 ENCOUNTER — Ambulatory Visit

## 2024-06-29 DIAGNOSIS — I428 Other cardiomyopathies: Secondary | ICD-10-CM

## 2024-06-30 LAB — CUP PACEART REMOTE DEVICE CHECK
Battery Remaining Longevity: 111 mo
Battery Voltage: 3.03 V
Brady Statistic AP VP Percent: 84.18 %
Brady Statistic AP VS Percent: 0.04 %
Brady Statistic AS VP Percent: 15.75 %
Brady Statistic AS VS Percent: 0.03 %
Brady Statistic RA Percent Paced: 84.2 %
Brady Statistic RV Percent Paced: 99.93 %
Date Time Interrogation Session: 20251021212351
HighPow Impedance: 51 Ohm
HighPow Impedance: 77 Ohm
Implantable Lead Connection Status: 753985
Implantable Lead Connection Status: 753985
Implantable Lead Connection Status: 753985
Implantable Lead Implant Date: 20050120
Implantable Lead Implant Date: 20050120
Implantable Lead Implant Date: 20100826
Implantable Lead Location: 753858
Implantable Lead Location: 753859
Implantable Lead Location: 753860
Implantable Lead Model: 4194
Implantable Lead Model: 5076
Implantable Lead Model: 5076
Implantable Pulse Generator Implant Date: 20250422
Lead Channel Impedance Value: 323 Ohm
Lead Channel Impedance Value: 361 Ohm
Lead Channel Impedance Value: 494 Ohm
Lead Channel Impedance Value: 627 Ohm
Lead Channel Impedance Value: 646 Ohm
Lead Channel Impedance Value: 855 Ohm
Lead Channel Pacing Threshold Amplitude: 0.5 V
Lead Channel Pacing Threshold Amplitude: 0.75 V
Lead Channel Pacing Threshold Amplitude: 0.875 V
Lead Channel Pacing Threshold Pulse Width: 0.4 ms
Lead Channel Pacing Threshold Pulse Width: 0.4 ms
Lead Channel Pacing Threshold Pulse Width: 0.4 ms
Lead Channel Sensing Intrinsic Amplitude: 16.8 mV
Lead Channel Sensing Intrinsic Amplitude: 2 mV
Lead Channel Setting Pacing Amplitude: 1.5 V
Lead Channel Setting Pacing Amplitude: 1.5 V
Lead Channel Setting Pacing Amplitude: 2 V
Lead Channel Setting Pacing Pulse Width: 0.4 ms
Lead Channel Setting Pacing Pulse Width: 0.4 ms
Lead Channel Setting Sensing Sensitivity: 0.45 mV
Zone Setting Status: 755011
Zone Setting Status: 755011

## 2024-07-01 NOTE — Progress Notes (Signed)
 Remote ICD Transmission

## 2024-07-02 ENCOUNTER — Ambulatory Visit: Payer: Self-pay | Admitting: Cardiology

## 2024-07-06 ENCOUNTER — Other Ambulatory Visit: Payer: Self-pay | Admitting: Cardiology

## 2024-09-19 ENCOUNTER — Other Ambulatory Visit: Payer: Self-pay | Admitting: Cardiology

## 2024-09-27 ENCOUNTER — Ambulatory Visit: Admitting: Student

## 2024-09-28 ENCOUNTER — Ambulatory Visit

## 2024-09-28 DIAGNOSIS — I428 Other cardiomyopathies: Secondary | ICD-10-CM | POA: Diagnosis not present

## 2024-09-29 ENCOUNTER — Telehealth: Payer: Self-pay

## 2024-09-29 LAB — CUP PACEART REMOTE DEVICE CHECK
Battery Remaining Longevity: 105 mo
Battery Voltage: 3.01 V
Brady Statistic RV Percent Paced: 99.92 %
Date Time Interrogation Session: 20260121052225
HighPow Impedance: 45 Ohm
HighPow Impedance: 74 Ohm
Implantable Lead Connection Status: 753985
Implantable Lead Connection Status: 753985
Implantable Lead Connection Status: 753985
Implantable Lead Implant Date: 20050120
Implantable Lead Implant Date: 20050120
Implantable Lead Implant Date: 20100826
Implantable Lead Location: 753858
Implantable Lead Location: 753859
Implantable Lead Location: 753860
Implantable Lead Model: 4194
Implantable Lead Model: 5076
Implantable Lead Model: 5076
Implantable Pulse Generator Implant Date: 20250422
Lead Channel Impedance Value: 266 Ohm
Lead Channel Impedance Value: 342 Ohm
Lead Channel Impedance Value: 475 Ohm
Lead Channel Impedance Value: 513 Ohm
Lead Channel Impedance Value: 589 Ohm
Lead Channel Impedance Value: 703 Ohm
Lead Channel Pacing Threshold Amplitude: 0.5 V
Lead Channel Pacing Threshold Amplitude: 0.75 V
Lead Channel Pacing Threshold Amplitude: 1.125 V
Lead Channel Pacing Threshold Pulse Width: 0.4 ms
Lead Channel Pacing Threshold Pulse Width: 0.4 ms
Lead Channel Pacing Threshold Pulse Width: 0.4 ms
Lead Channel Sensing Intrinsic Amplitude: 1.8 mV
Lead Channel Sensing Intrinsic Amplitude: 15.6 mV
Lead Channel Setting Pacing Amplitude: 1.5 V
Lead Channel Setting Pacing Amplitude: 1.75 V
Lead Channel Setting Pacing Amplitude: 2 V
Lead Channel Setting Pacing Pulse Width: 0.4 ms
Lead Channel Setting Pacing Pulse Width: 0.4 ms
Lead Channel Setting Sensing Sensitivity: 0.45 mV
Zone Setting Status: 755011
Zone Setting Status: 755011

## 2024-09-29 NOTE — Telephone Encounter (Signed)
 CRT-D routine remote transmission: Shows recent elevation in his Optivol/HF logistics.   Spoke with patient.  He has noticed that his weight was creeping up and some lower extremity swelling, denies SOB.  He started cutting out junk food in last week and sodium and notices his weight is going back down.    I did remind him that he has prn Lasix  40mg  and should take that if his weight starts increasing again.   He will see Dr. Jordan for routine follow up on 10/11/24 and I will forward this to him as an FYI.

## 2024-10-01 NOTE — Progress Notes (Signed)
 Remote ICD Transmission

## 2024-10-03 ENCOUNTER — Ambulatory Visit: Payer: Self-pay | Admitting: Cardiology

## 2024-10-05 NOTE — Progress Notes (Unsigned)
 "  Jonathon Avila Date of Birth: 1956-08-31   History of Present Illness: Jonathon Avila is seen  today for followup of CHF. 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 He is status post biventricular ICD. This did result in significant improvement in LV function. Echo was last check in March 2018 and was unchanged from 2014 with EF 45-50%. He underwent a generator change out of his ICD in April 2025  On follow up today he is doing very well[facility] Some limitation due to hip arthritis. No chest pain, dyspnea, palpitations, edema. Weight is stable. Hasn't had to take lasix .      Current Outpatient Medications on File Prior to Visit  Medication Sig Dispense Refill   acetaminophen  (TYLENOL ) 500 MG tablet Take 500-1,000 mg by mouth every 6 (six) hours as needed (pain.).     aspirin  EC 81 MG tablet Take 1 tablet (81 mg total) by mouth daily. 90 tablet 3   carvedilol  (COREG ) 25 MG tablet Take 1 tablet (25 mg total) by mouth 2 (two) times daily with a meal. 180 tablet 3   diclofenac Sodium (VOLTAREN) 1 % GEL Apply 4 g topically 4 (four) times daily as needed (pain.).     Emollient (CERAVE EX) Apply 1 Application topically as needed (dry/irritated skin).     fluticasone (FLONASE) 50 MCG/ACT nasal spray Place 2 sprays into the nose in the morning.     furosemide  (LASIX ) 40 MG tablet Take 1 tablet (40 mg total) by mouth daily as needed. 90 tablet 1   ibuprofen (ADVIL) 200 MG tablet Take 200 mg by mouth every 8 (eight) hours as needed (arthritis pain.).     levocetirizine (XYZAL) 5 MG tablet Take 5 mg by mouth in the morning.     lisinopril  (ZESTRIL ) 10 MG tablet Take 10 mg by mouth in the morning.     loratadine (CLARITIN) 10 MG tablet Take 10 mg by mouth in the morning.     Multiple Vitamin (MULTIVITAMIN WITH MINERALS) TABS tablet Take 1 tablet by mouth in the morning.     Peppermint Oil (IBGARD) 90 MG CPCR Take 1 capsule by mouth daily as  needed (digestive issues (bloating)).     polyethylene glycol (MIRALAX / GLYCOLAX) 17 g packet Take 17 g by mouth every 3 (three) days.     simvastatin (ZOCOR) 20 MG tablet Take 20 mg by mouth every evening.     spironolactone  (ALDACTONE ) 25 MG tablet Take 1 tablet (25 mg total) by mouth at bedtime. 90 tablet 0   tamsulosin (FLOMAX) 0.4 MG CAPS capsule Take 0.4 mg by mouth in the morning.     VITAMIN D PO Take 2,000 Units by mouth 2 (two) times daily.     [DISCONTINUED] Cholecalciferol 25 MCG (1000 UT) capsule Take by mouth.     No current facility-administered medications on file prior to visit.    Allergies  Allergen Reactions   Penicillins Anaphylaxis    hypotension   Promethazine Other (See Comments)    Unknown reaction.   Codeine     Upset stomach    Shellfish Protein-Containing Drug Products Nausea And Vomiting    Past Medical History:  Diagnosis Date   6949 lead    6949 rate sense portion was replaced with a Medtronic 5076-lead 2010    Adenomatous polyps    Allergy    Chronic systolic CHF (congestive heart failure) (HCC)    Hyperlipidemia  Hypertension    controlled on medications    ICD (implantable cardioverter-defibrillator) battery depletion    with pacemaker, CRT   LBBB (left bundle branch block)    LV dysfunction    Mitral insufficiency    Nonischemic dilated cardiomyopathy (HCC)    Pulmonary hypertension (HCC)     Past Surgical History:  Procedure Laterality Date   BIV ICD GENERATOR CHANGEOUT N/A 12/29/2023   Procedure: BIV ICD GENERATOR CHANGEOUT;  Surgeon: Fernande Elspeth BROCKS, MD;  Location: [facility] INVASIVE CV LAB;  Service: Cardiovascular;  Laterality: N/A;   CARDIAC CATHETERIZATION  10/25/2001   EF 65%   CHOLECYSTECTOMY     COLONOSCOPY     EP IMPLANTABLE DEVICE N/A 09/14/2015   Procedure:  ICD Generator Changeout;  Surgeon: Elspeth BROCKS Fernande, MD;  Location: [facility] INVASIVE CV LAB;  Service: Cardiovascular;  Laterality: N/A;   ICD     BIVENTRICULAR   POLYPECTOMY      US  ECHOCARDIOGRAPHY  02/19/2009   EF 45%   wisdom teeth extraxtion      Social History   Tobacco Use  Smoking Status Never  Smokeless Tobacco Never    Social History   Substance and Sexual Activity  Alcohol Use No    Family History  Problem Relation Age of Onset   COPD Mother        deceased   Colon cancer Father        deceased age 13   Prostate cancer Maternal Grandmother    Esophageal cancer Neg Hx    Pancreatic cancer Neg Hx    Rectal cancer Neg Hx    Stomach cancer Neg Hx    Colon polyps Neg Hx     Review of Systems: As noted in history of present illness.  All other systems were reviewed and are negative.  Physical Exam: There were no vitals taken for this visit. GENERAL:  Well appearing WM in NAD HEENT:  PERRL, EOMI, sclera are clear. Oropharynx is clear. NECK:  No jugular venous distention, no bruits LUNGS:  Clear to auscultation bilaterally CHEST:  Unremarkable, ICD in left upper chest HEART:  RRR,  PMI not displaced or sustained,S1 and S2 within normal limits, no S3, no S4: no clicks, no rubs, no murmurs EXT:  2 + pulses throughout, no edema,  SKIN:  Warm and dry.  No rashes NEURO:  Alert and oriented x 3. Cranial nerves II through XII intact. PSYCH:  Cognitively intact  LABORATORY DATA:  Lab Results  Component Value Date   WBC 6.7 12/29/2023   HGB 15.1 12/29/2023   HCT 44.5 12/29/2023   PLT 141 (L) 12/29/2023   GLUCOSE 85 12/24/2023   CHOL  10/21/2009    112        ATP III CLASSIFICATION:  <200     mg/dL   Desirable  799-760  mg/dL   Borderline High  >=759    mg/dL   High          TRIG 870 10/21/2009   HDL 34 (L) 10/21/2009   LDLCALC  10/21/2009    52        Total Cholesterol/HDL:CHD Risk Coronary Heart Disease Risk Table                     Men   Women  1/2 Average Risk   3.4   3.3  Average Risk       5.0   4.4  2 X Average Risk   9.6  7.1  3 X Average Risk  23.4   11.0        Use the calculated Patient Ratio above and the  CHD Risk Table to determine the patient's CHD Risk.        ATP III CLASSIFICATION (LDL):  <100     mg/dL   Optimal  899-870  mg/dL   Near or Above                    Optimal  130-159  mg/dL   Borderline  839-810  mg/dL   High  >809     mg/dL   Very High   ALT 23 90/95/7983   AST 22 05/13/2015   NA 141 12/24/2023   K 4.5 12/24/2023   CL 102 12/24/2023   CREATININE 0.81 12/24/2023   BUN 11 12/24/2023   CO2 26 12/24/2023   TSH 0.63 06/16/2019   INR 1.1 ratio (H) 04/26/2009   HGBA1C 5.8 06/24/2019   Labs reviewed from primary care 04/03/16: CMET and CBC normal. Cholesterol 116, trig- 132, HDL 31, LDL 59. BNP 14.1. Jul 04, 2016: A1c 5.7%. Oct 07, 2016 A1c 5.6%.  Dated 04/15/17: glucose 115. A1c 5.6%. Otherwise CMET and CBC normal. Cholesterol 120, triglycerides 109, HDL 35, LDL 63.  Dated 10/05/17: glucose 152, otherwise CBC and CMET normal. Cholesterol 108, triglycerides 152, HDL 32, LDL 46 Dated 04/14/18: cholesterol 116, triglycerides 114, HDL 32, LDL 61. CMET normal.  Dated 06/10/19: normal CBC and CMET.  Dated 01/26/20: cholesterol 110, triglycerides 57, HDL 57, LDL 42.  CBC, CMET, Vit D normal.  Dated 01/02/21: cholesterol 126, triglycerides 47, HDL 56, LDL 59.  Dated 06/26/21: normal CMET and CBC. Dated 11/824: A1c 5.2%, cholesterol 110, triglycerides 76, HDL 43, LDL 51. CMET and CBC normal except plts 122K.    Echo 11/14/16: Study Conclusions   - Left ventricle: The cavity size was normal. Wall thickness was   increased in a pattern of mild LVH. Systolic function was mildly   reduced. The estimated ejection fraction was in the range of 45%   to 50%. Incoordinate septal motion. Doppler parameters are   consistent with abnormal left ventricular relaxation (grade 1   diastolic dysfunction). The E/e&' ratio is between 8-15,   suggesting indeterminate LV filling pressure. - Left atrium: The atrium was normal in size. - Right ventricle: The cavity size was normal. Wall thickness was    normal. Pacer wire or catheter noted in right ventricle. Systolic   function was normal. - Right atrium: The atrium was mildly dilated. Pacer wire or   catheter noted in right atrium. - Atrial septum: No defect or patent foramen ovale was identified. - Tricuspid valve: There was mild regurgitation. - Pulmonary arteries: PA peak pressure: 27 mm Hg (S). - Inferior vena cava: The vessel was normal in size. The   respirophasic diameter changes were in the normal range (>= 50%),   consistent with normal central venous pressure.   Impressions:   - Compared to a previous study in 2014, there has been no change.  Echo 08/23/20: IMPRESSIONS     1. Left ventricular ejection fraction, by estimation, is 50%. The left  ventricle has mildly decreased function. The left ventricle demonstrates  global hypokinesis. Left ventricular diastolic parameters are consistent  with Grade I diastolic dysfunction  (impaired relaxation).   2. Right ventricular systolic function is normal. The right ventricular  size is normal.   3. Left atrial size was moderately dilated.  4. The mitral valve is grossly normal. Trivial mitral valve  regurgitation.   5. The aortic valve is tricuspid. There is mild calcification of the  aortic valve. Aortic valve regurgitation is not visualized. No aortic  stenosis is present.   Comparison(s): A prior study was performed on 11/14/2016. Similar LV  function from prior.     Assessment / Plan: 1. Chronic systolic congestive heart failure. Last Ejection fraction of 50% in 2021 he is on optimal therapy with spironolactone , Coreg  and lisinopril  with PRN lasix . Euvolemic. Continue heart healthy diet, low sodium, regular exercise   2. Status post ICD/biventricular pacemaker. Continue followup in EP clinic. S/p generator change out in April 2025  3.Left bundle branch block.  Follow up in one year "

## 2024-10-11 ENCOUNTER — Ambulatory Visit: Admitting: Cardiology

## 2024-11-08 ENCOUNTER — Ambulatory Visit: Admitting: Pulmonary Disease

## 2024-11-10 ENCOUNTER — Ambulatory Visit: Admitting: Cardiology

## 2024-12-28 ENCOUNTER — Encounter

## 2025-03-29 ENCOUNTER — Encounter

## 2025-06-28 ENCOUNTER — Encounter

## 2025-09-27 ENCOUNTER — Encounter

## 2025-12-27 ENCOUNTER — Encounter
